# Patient Record
Sex: Male | Born: 1950
Health system: Southern US, Community
[De-identification: ages and names within clinical notes are randomized; demographics above are authoritative.]

## PROBLEM LIST (undated history)

## (undated) DIAGNOSIS — Z973 Presence of spectacles and contact lenses: Secondary | ICD-10-CM

## (undated) DIAGNOSIS — Z85820 Personal history of malignant melanoma of skin: Secondary | ICD-10-CM

## (undated) DIAGNOSIS — K644 Residual hemorrhoidal skin tags: Secondary | ICD-10-CM

## (undated) DIAGNOSIS — Z8546 Personal history of malignant neoplasm of prostate: Secondary | ICD-10-CM

## (undated) DIAGNOSIS — Z86010 Personal history of colonic polyps: Secondary | ICD-10-CM

## (undated) DIAGNOSIS — R351 Nocturia: Secondary | ICD-10-CM

## (undated) DIAGNOSIS — J449 Chronic obstructive pulmonary disease, unspecified: Secondary | ICD-10-CM

## (undated) DIAGNOSIS — Z860101 Personal history of adenomatous and serrated colon polyps: Secondary | ICD-10-CM

## (undated) DIAGNOSIS — G5601 Carpal tunnel syndrome, right upper limb: Secondary | ICD-10-CM

## (undated) DIAGNOSIS — M1990 Unspecified osteoarthritis, unspecified site: Secondary | ICD-10-CM

## (undated) DIAGNOSIS — C801 Malignant (primary) neoplasm, unspecified: Secondary | ICD-10-CM

## (undated) DIAGNOSIS — K219 Gastro-esophageal reflux disease without esophagitis: Secondary | ICD-10-CM

## (undated) HISTORY — PX: KNEE ARTHROSCOPY: SUR90

## (undated) HISTORY — PX: COLONOSCOPY: SHX174

## (undated) HISTORY — PX: EYE SURGERY: SHX253

## (undated) HISTORY — DX: Malignant (primary) neoplasm, unspecified: C80.1

## (undated) HISTORY — PX: PROSTATECTOMY: SHX69

---

## 1987-01-20 HISTORY — PX: ELBOW SURGERY: SHX618

## 1998-11-25 ENCOUNTER — Ambulatory Visit (HOSPITAL_COMMUNITY): Admission: RE | Admit: 1998-11-25 | Discharge: 1998-11-25 | Payer: Self-pay | Admitting: Gastroenterology

## 2000-09-02 ENCOUNTER — Ambulatory Visit (HOSPITAL_BASED_OUTPATIENT_CLINIC_OR_DEPARTMENT_OTHER): Admission: RE | Admit: 2000-09-02 | Discharge: 2000-09-02 | Payer: Self-pay | Admitting: *Deleted

## 2003-02-26 ENCOUNTER — Emergency Department (HOSPITAL_COMMUNITY): Admission: AD | Admit: 2003-02-26 | Discharge: 2003-02-26 | Payer: Self-pay | Admitting: Family Medicine

## 2003-08-26 ENCOUNTER — Emergency Department (HOSPITAL_COMMUNITY): Admission: EM | Admit: 2003-08-26 | Discharge: 2003-08-26 | Payer: Self-pay | Admitting: Family Medicine

## 2003-08-27 ENCOUNTER — Emergency Department (HOSPITAL_COMMUNITY): Admission: EM | Admit: 2003-08-27 | Discharge: 2003-08-27 | Payer: Self-pay | Admitting: Family Medicine

## 2004-01-28 ENCOUNTER — Ambulatory Visit (HOSPITAL_COMMUNITY): Admission: RE | Admit: 2004-01-28 | Discharge: 2004-01-28 | Payer: Self-pay | Admitting: Gastroenterology

## 2004-05-05 ENCOUNTER — Emergency Department (HOSPITAL_COMMUNITY): Admission: EM | Admit: 2004-05-05 | Discharge: 2004-05-05 | Payer: Self-pay | Admitting: Family Medicine

## 2004-07-27 ENCOUNTER — Emergency Department (HOSPITAL_COMMUNITY): Admission: EM | Admit: 2004-07-27 | Discharge: 2004-07-27 | Payer: Self-pay | Admitting: Family Medicine

## 2006-02-22 ENCOUNTER — Emergency Department (HOSPITAL_COMMUNITY): Admission: EM | Admit: 2006-02-22 | Discharge: 2006-02-22 | Payer: Self-pay | Admitting: Family Medicine

## 2011-11-08 ENCOUNTER — Emergency Department (HOSPITAL_COMMUNITY)
Admission: EM | Admit: 2011-11-08 | Discharge: 2011-11-08 | Disposition: A | Discharge status: Home / Self Care | Payer: BC Managed Care – PPO | Source: Home / Self Care | Attending: Family Medicine | Admitting: Family Medicine

## 2011-11-08 ENCOUNTER — Encounter (HOSPITAL_COMMUNITY): Payer: Self-pay | Admitting: Emergency Medicine

## 2011-11-08 DIAGNOSIS — L259 Unspecified contact dermatitis, unspecified cause: Secondary | ICD-10-CM

## 2011-11-08 MED ORDER — METHYLPREDNISOLONE ACETATE 80 MG/ML IJ SUSP
INTRAMUSCULAR | Status: AC
  Filled 2011-11-08: qty 1

## 2011-11-08 MED ORDER — PERMETHRIN 5 % EX CREA: TOPICAL_CREAM | CUTANEOUS | Status: DC

## 2011-11-08 MED ORDER — HYDROXYZINE HCL 25 MG PO TABS: 25.0000 mg | ORAL_TABLET | Freq: Four times a day (QID) | ORAL | Status: DC

## 2011-11-08 MED ORDER — FLUTICASONE PROPIONATE 0.05 % EX CREA: TOPICAL_CREAM | Freq: Two times a day (BID) | CUTANEOUS | Status: DC

## 2011-11-08 MED ORDER — METHYLPREDNISOLONE ACETATE 40 MG/ML IJ SUSP
80.0000 mg | Freq: Once | INTRAMUSCULAR | Status: AC
  Administered 2011-11-08: 80 mg via INTRAMUSCULAR

## 2011-11-08 NOTE — ED Provider Notes (Signed)
History     CSN: [id number]  Arrival date & time 11/08/11  0915   First MD Initiated Contact with Patient 11/08/11 [phone]      Chief Complaint  Patient presents with  . Rash    (Consider location/radiation/quality/duration/timing/severity/associated sxs/prior treatment) Patient is a 61 y.o. male presenting with rash. The history is provided by the patient.  Rash  This is a new problem. The current episode started more than 1 week ago (2 week h/o rash, travels a lot , staying in hotels, maintains good  hygiene.). The problem has not changed since onset.The problem is associated with an unknown factor. There has been no fever. The rash is present on the back, left ankle, right arm and left arm. The patient is experiencing no pain. Associated symptoms include itching.    Past Medical History  Diagnosis Date  . COPD (chronic obstructive pulmonary disease)   . Prostate cancer     Past Surgical History  Procedure Date  . Biopsy prostate     No family history on file.  History  Substance Use Topics  . Smoking status: Never Smoker   . Smokeless tobacco: Not on file  . Alcohol Use: Yes      Review of Systems  Constitutional: Negative.   Gastrointestinal: Negative.   Skin: Positive for itching and rash.    Allergies  Review of patient's allergies indicates no known allergies.  Home Medications   Current Outpatient Rx  Name Route Sig Dispense Refill  . BC HEADACHE POWDER PO Oral Take by mouth.    Marland Kitchen OVER THE COUNTER MEDICATION  Moisturizing lotions and anti-itch spray    . FLUTICASONE PROPIONATE 0.05 % EX CREA Topical Apply topically 2 (two) times daily. 60 g 0  . HYDROXYZINE HCL 25 MG PO TABS Oral Take 1 tablet (25 mg total) by mouth every 6 (six) hours. As needed for itching. 20 tablet 0  . PERMETHRIN 5 % EX CREA  Apply as directed on label, repeat in 1 week. 60 g 1    BP 150/89  Pulse 85  Temp 98 F (36.7 C) (Oral)  Resp 18  SpO2 100%  Physical Exam    Nursing note and vitals reviewed. Constitutional: He is oriented to person, place, and time. He appears well-developed and well-nourished.  Musculoskeletal: He exhibits no tenderness.  Neurological: He is alert and oriented to person, place, and time.  Skin: Skin is warm and dry. Rash noted.       Scattered excoriated papular rash on noted affected areas esp across back.  Psychiatric: He has a normal mood and affect.    ED Course  Procedures (including critical care time)  Labs Reviewed - No data to display No results found.   1. Contact dermatitis       MDM          Linna Hoff, MD 11/08/11 [phone]

## 2011-11-08 NOTE — ED Notes (Signed)
Patient reports rash for 2 weeks.  Patient travels frequently and just flew in from [place].  Reports rash itches.  Scattered , red bumps on back , ankles, antecubital of right arm.  Has used lotions for moisturizing and has a spray for itching.  Denies any one else with this

## 2011-12-13 ENCOUNTER — Encounter (HOSPITAL_COMMUNITY): Payer: Self-pay

## 2011-12-13 ENCOUNTER — Emergency Department (INDEPENDENT_AMBULATORY_CARE_PROVIDER_SITE_OTHER)
Admission: EM | Admit: 2011-12-13 | Discharge: 2011-12-13 | Disposition: A | Discharge status: Home / Self Care | Payer: BC Managed Care – PPO | Source: Home / Self Care | Attending: Family Medicine | Admitting: Family Medicine

## 2011-12-13 DIAGNOSIS — L259 Unspecified contact dermatitis, unspecified cause: Secondary | ICD-10-CM

## 2011-12-13 MED ORDER — METHYLPREDNISOLONE ACETATE 80 MG/ML IJ SUSP
INTRAMUSCULAR | Status: AC
  Filled 2011-12-13: qty 1

## 2011-12-13 MED ORDER — METHYLPREDNISOLONE ACETATE 40 MG/ML IJ SUSP
80.0000 mg | Freq: Once | INTRAMUSCULAR | Status: AC
  Administered 2011-12-13: 80 mg via INTRAMUSCULAR

## 2011-12-13 NOTE — ED Notes (Signed)
Seen 11-20 for rash, mostly resolved .C/o continued issues w rash on ankle

## 2011-12-13 NOTE — ED Provider Notes (Signed)
History     CSN: [id number]  Arrival date & time 12/13/11  4098   First MD Initiated Contact with Patient 12/13/11 [phone]      Chief Complaint  Patient presents with  . Rash    (Consider location/radiation/quality/duration/timing/severity/associated sxs/prior treatment) Patient is a 61 y.o. male presenting with rash. The history is provided by the patient.  Rash  This is a new problem. The current episode started more than 1 week ago. The problem has been rapidly improving. The problem is associated with an unknown factor. There has been no fever. The rash is present on the right ankle and back. The patient is experiencing no pain. Associated symptoms include itching.    Past Medical History  Diagnosis Date  . COPD (chronic obstructive pulmonary disease)   . Prostate cancer     Past Surgical History  Procedure Date  . Biopsy prostate     History reviewed. No pertinent family history.  History  Substance Use Topics  . Smoking status: Never Smoker   . Smokeless tobacco: Not on file  . Alcohol Use: Yes      Review of Systems  Constitutional: Negative.   Skin: Positive for itching and rash.    Allergies  Review of patient's allergies indicates no known allergies.  Home Medications   Current Outpatient Rx  Name  Route  Sig  Dispense  Refill  . BC HEADACHE POWDER PO   Oral   Take by mouth.         Marland Kitchen FLUTICASONE PROPIONATE 0.05 % EX CREA   Topical   Apply topically 2 (two) times daily.   60 g   0   . HYDROXYZINE HCL 25 MG PO TABS   Oral   Take 1 tablet (25 mg total) by mouth every 6 (six) hours. As needed for itching.   20 tablet   0   . OVER THE COUNTER MEDICATION      Moisturizing lotions and anti-itch spray         . PERMETHRIN 5 % EX CREA      Apply as directed on label, repeat in 1 week.   60 g   1     BP 169/96  Pulse 62  Temp 98.2 F (36.8 C) (Oral)  Resp 14  SpO2 97%  Physical Exam  Nursing note and vitals  reviewed. Constitutional: He appears well-developed and well-nourished.  Skin: Skin is warm and dry. Rash noted.       ED Course  Procedures (including critical care time)  Labs Reviewed - No data to display No results found.   1. Contact dermatitis       MDM          Linna Hoff, MD 12/13/11 [phone]

## 2012-02-05 ENCOUNTER — Encounter (HOSPITAL_COMMUNITY): Payer: Self-pay | Admitting: Emergency Medicine

## 2012-02-05 ENCOUNTER — Emergency Department (INDEPENDENT_AMBULATORY_CARE_PROVIDER_SITE_OTHER)
Admission: EM | Admit: 2012-02-05 | Discharge: 2012-02-05 | Disposition: A | Discharge status: Home / Self Care | Payer: BC Managed Care – PPO | Source: Home / Self Care | Attending: Emergency Medicine | Admitting: Emergency Medicine

## 2012-02-05 DIAGNOSIS — L259 Unspecified contact dermatitis, unspecified cause: Secondary | ICD-10-CM

## 2012-02-05 DIAGNOSIS — L300 Nummular dermatitis: Secondary | ICD-10-CM

## 2012-02-05 MED ORDER — FLUTICASONE PROPIONATE 0.05 % EX CREA: TOPICAL_CREAM | Freq: Two times a day (BID) | CUTANEOUS | Status: DC

## 2012-02-05 MED ORDER — METHYLPREDNISOLONE ACETATE 80 MG/ML IJ SUSP
INTRAMUSCULAR | Status: AC
  Filled 2012-02-05: qty 1

## 2012-02-05 MED ORDER — METHYLPREDNISOLONE ACETATE 80 MG/ML IJ SUSP
80.0000 mg | Freq: Once | INTRAMUSCULAR | Status: AC
  Administered 2012-02-05: 80 mg via INTRAMUSCULAR

## 2012-02-05 MED ORDER — HYDROXYZINE HCL 25 MG PO TABS: 25.0000 mg | ORAL_TABLET | Freq: Four times a day (QID) | ORAL | Status: DC

## 2012-02-05 MED ORDER — PREDNISONE 20 MG PO TABS: 20.0000 mg | ORAL_TABLET | Freq: Two times a day (BID) | ORAL | Status: DC

## 2012-02-05 NOTE — ED Provider Notes (Addendum)
Chief Complaint  Patient presents with  . Rash    History of Present Illness:   Daniel Barber is a 62 year old male who has had a four-month history of a rash on his arms, back, and chest. He came here in November when this first began and was given a prescription for fluticasone cream and hydroxyzine. This did seem to help for a while but then he ran of the meds and the rash came right back again. He cannot think of anything particular that he's been exposed to including changes in soaps, detergents, washing powder, dryer sheet, or fabric softener. He denies any exposure to plants, animals, new medications, or different foods. He's not had any trouble breathing or swelling of his lips, tongue, or throat. He also tried 2 courses of Elimite, but this failed to do any good.  Review of Systems:  Other than noted above, the patient denies any of the following symptoms: Systemic:  No fever, chills, sweats, weight loss, or fatigue. ENT:  No nasal congestion, rhinorrhea, sore throat, swelling of lips, tongue or throat. Resp:  No cough, wheezing, or shortness of breath. Skin:  No rash, itching, nodules, or suspicious lesions.  PMFSH:  Past medical history, family history, social history, meds, and allergies were reviewed.  Physical Exam:   Vital signs:  BP 144/97  Pulse 76  Temp 98.4 F (36.9 C) (Oral)  Resp 18  SpO2 97% Gen:  Alert, oriented, in no distress. ENT:  Pharynx clear, no intraoral lesions, moist mucous membranes. Lungs:  Clear to auscultation. Skin:  He has an erythematous, eczematous rash on the arms and trunk including the back. Some of the lesions on the back are round and coin-like .  Course in Urgent Care Center:   He was given Depo-Medrol 80 mg IM.  Assessment:  The encounter diagnosis was Nummular eczema.  I don't think this is scabies since he tried 2 courses of you might and it didn't help any. It appears to be eczema versus contact dermatitis versus nummular eczema.  I recommended a followup with his dermatologist.  Plan:   1.  The following meds were prescribed:   New Prescriptions   FLUTICASONE (CUTIVATE) 0.05 % CREAM    Apply topically 2 (two) times daily.   HYDROXYZINE (ATARAX/VISTARIL) 25 MG TABLET    Take 1 tablet (25 mg total) by mouth every 6 (six) hours.   PREDNISONE (DELTASONE) 20 MG TABLET    Take 1 tablet (20 mg total) by mouth 2 (two) times daily.   2.  The patient was instructed in symptomatic care and handouts were given. 3.  The patient was told to return if becoming worse in any way, if no better in 3 or 4 days, and given some red flag symptoms that would indicate earlier return.     Reuben Likes, MD 02/05/12 1709  Reuben Likes, MD 02/05/12 0272  Reuben Likes, MD 02/05/12 [phone]

## 2012-02-05 NOTE — ED Notes (Signed)
Pt c/o persistent rash on arms and back Was seen here back in October and Rx'd fluticasone, hydroxyzine, and permethrin cream Sx include: itching Denies: SOB, lip swelling, fevers, vomiting, nauseas, diarrhea  He is alert w/no signs of acute distress.

## 2013-12-28 ENCOUNTER — Encounter (HOSPITAL_COMMUNITY): Payer: Self-pay | Admitting: Emergency Medicine

## 2013-12-28 ENCOUNTER — Telehealth (HOSPITAL_COMMUNITY): Payer: Self-pay | Admitting: *Deleted

## 2013-12-28 ENCOUNTER — Emergency Department (HOSPITAL_COMMUNITY)
Admission: EM | Admit: 2013-12-28 | Discharge: 2013-12-28 | Disposition: A | Discharge status: Home / Self Care | Payer: BC Managed Care – PPO | Source: Home / Self Care | Attending: Family Medicine | Admitting: Family Medicine

## 2013-12-28 DIAGNOSIS — M79645 Pain in left finger(s): Secondary | ICD-10-CM

## 2013-12-28 DIAGNOSIS — M1990 Unspecified osteoarthritis, unspecified site: Secondary | ICD-10-CM

## 2013-12-28 LAB — URIC ACID: URIC ACID, SERUM: 6.8 mg/dL (ref 4.0–7.8)

## 2013-12-28 MED ORDER — TRIAMCINOLONE ACETONIDE 40 MG/ML IJ SUSP
INTRAMUSCULAR | Status: AC
  Filled 2013-12-28: qty 1

## 2013-12-28 MED ORDER — COLCHICINE 0.6 MG PO TABS: ORAL_TABLET | ORAL | Status: DC

## 2013-12-28 MED ORDER — TRIAMCINOLONE ACETONIDE 40 MG/ML IJ SUSP
40.0000 mg | Freq: Once | INTRAMUSCULAR | Status: AC
  Administered 2013-12-28: 40 mg via INTRAMUSCULAR

## 2013-12-28 MED ORDER — INDOMETHACIN 50 MG PO CAPS: 50.0000 mg | ORAL_CAPSULE | Freq: Three times a day (TID) | ORAL | Status: DC | PRN

## 2013-12-28 NOTE — ED Provider Notes (Signed)
CSN: [id number]     Arrival date & time 12/28/13  0801 History   First MD Initiated Contact with Patient 12/28/13 [phone]     Chief Complaint  Patient presents with  . Hand Pain   (Consider location/radiation/quality/duration/timing/severity/associated sxs/prior Treatment) HPI Comments: 63 year old male complaining of a two-week history of pain to the left index finger MCP. Gradual onset and progression from that time. It has leveled all in regards to swelling and pain. There has been no increase or change in the past several days. He does feel popping and cracking with flexion and extension of the MCP. Denies any known injury. He does work with his hands  as far as Investment banker, operational and working on the keyboard.   Past Medical History  Diagnosis Date  . COPD (chronic obstructive pulmonary disease)   . Prostate cancer    Past Surgical History  Procedure Laterality Date  . Biopsy prostate     No family history on file. History  Substance Use Topics  . Smoking status: Never Smoker   . Smokeless tobacco: Not on file  . Alcohol Use: Yes    Review of Systems  Constitutional: Negative.   Respiratory: Negative.   Gastrointestinal: Negative.   Genitourinary: Negative.   Musculoskeletal:       As per HPI  Skin: Negative.   Neurological: Negative for weakness and numbness.    Allergies  Review of patient's allergies indicates no known allergies.  Home Medications   Prior to Admission medications   Medication Sig Start Date End Date Taking? Authorizing Provider  Naproxen Sodium (ALEVE PO) Take by mouth.   Yes Historical Provider, MD  Aspirin-Salicylamide-Caffeine (BC HEADACHE POWDER PO) Take by mouth.    Historical Provider, MD  colchicine 0.6 MG tablet One tab po now, repeat in 2 hours, then 1 q d. 12/28/13   Janne Napoleon, NP  indomethacin (INDOCIN) 50 MG capsule Take 1 capsule (50 mg total) by mouth 3 (three) times daily as needed. 12/28/13   Janne Napoleon, NP   BP 151/82 mmHg   Pulse 58  Temp(Src) 98.6 F (37 C) (Oral)  Resp 18  SpO2 96% Physical Exam  Constitutional: He is oriented to person, place, and time. He appears well-developed and well-nourished. No distress.  HENT:  Head: Normocephalic and atraumatic.  Eyes: EOM are normal.  Neck: Normal range of motion. Neck supple.  Musculoskeletal:  Left hand exam reveals minimal swelling and erythema over the index finger MCP. There is no tenderness to the finger. There is mild tenderness to the MCP. Flexion and extension is intact although flexion is limited to 60. No deformity. Tenderness along the extensor tendon of the index MCP. No deformities. No lymphangitis, no increased warmth.  Neurological: He is alert and oriented to person, place, and time. No cranial nerve deficit.  Skin: Skin is warm and dry.  No loss and skin integrity. No wounds.  Psychiatric: He has a normal mood and affect.  Nursing note and vitals reviewed.   ED Course  Procedures (including critical care time) Labs Review Labs Reviewed  URIC ACID    Imaging Review No results found.   MDM   1. Pain in finger of left hand   2. Arthritis    Does not appear infectious, more at inflammatory Tx with colchicine trial and indomethacin with  INj of Kenalog 40 mg IM now Return for increase redness, pain swelling, heat, fever, loss of function.      Janne Napoleon, NP 12/28/13 [phone]  Janne Napoleon, NP 12/28/13 [phone]

## 2013-12-28 NOTE — ED Notes (Addendum)
Uric Acid 6.8.  Message sent to Janne Napoleon NP asking if pt. needs to be notified of result. He wrote back to notify pt. and tell him he can only take Colchicine for 3 days.  I called and left a message to call. Call 1. Roselyn Meier 12/28/2013 I called pt. Pt. verified x 2 and given result and instructions.  Told pt. to f/u with PCP. Roselyn Meier 12/31/2013

## 2013-12-28 NOTE — Discharge Instructions (Signed)
Arthritis, Nonspecific °Arthritis is inflammation of a joint. This usually means pain, redness, warmth or swelling are present. One or more joints may be involved. There are a number of types of arthritis. Your caregiver may not be able to tell what type of arthritis you have right away. °CAUSES  °The most common cause of arthritis is the wear and tear on the joint (osteoarthritis). This causes damage to the cartilage, which can break down over time. The knees, hips, back and neck are most often affected by this type of arthritis. °Other types of arthritis and common causes of joint pain include: °· Sprains and other injuries near the joint. Sometimes minor sprains and injuries cause pain and swelling that develop hours later. °· Rheumatoid arthritis. This affects hands, feet and knees. It usually affects both sides of your body at the same time. It is often associated with chronic ailments, fever, weight loss and general weakness. °· Crystal arthritis. Gout and pseudo gout can cause occasional acute severe pain, redness and swelling in the foot, ankle, or knee. °· Infectious arthritis. Bacteria can get into a joint through a break in overlying skin. This can cause infection of the joint. Bacteria and viruses can also spread through the blood and affect your joints. °· Drug, infectious and allergy reactions. Sometimes joints can become mildly painful and slightly swollen with these types of illnesses. °SYMPTOMS  °· Pain is the main symptom. °· Your joint or joints can also be red, swollen and warm or hot to the touch. °· You may have a fever with certain types of arthritis, or even feel overall ill. °· The joint with arthritis will hurt with movement. Stiffness is present with some types of arthritis. °DIAGNOSIS  °Your caregiver will suspect arthritis based on your description of your symptoms and on your exam. Testing may be needed to find the type of arthritis: °· Blood and sometimes urine tests. °· X-ray tests  and sometimes CT or MRI scans. °· Removal of fluid from the joint (arthrocentesis) is done to check for bacteria, crystals or other causes. Your caregiver (or a specialist) will numb the area over the joint with a local anesthetic, and use a needle to remove joint fluid for examination. This procedure is only minimally uncomfortable. °· Even with these tests, your caregiver may not be able to tell what kind of arthritis you have. Consultation with a specialist (rheumatologist) may be helpful. °TREATMENT  °Your caregiver will discuss with you treatment specific to your type of arthritis. If the specific type cannot be determined, then the following general recommendations may apply. °Treatment of severe joint pain includes: °· Rest. °· Elevation. °· Anti-inflammatory medication (for example, ibuprofen) may be prescribed. Avoiding activities that cause increased pain. °· Only take over-the-counter or prescription medicines for pain and discomfort as recommended by your caregiver. °· Cold packs over an inflamed joint may be used for 10 to 15 minutes every hour. Hot packs sometimes feel better, but do not use overnight. Do not use hot packs if you are diabetic without your caregiver's permission. °· A cortisone shot into arthritic joints may help reduce pain and swelling. °· Any acute arthritis that gets worse over the next 1 to 2 days needs to be looked at to be sure there is no joint infection. °Long-term arthritis treatment involves modifying activities and lifestyle to reduce joint stress jarring. This can include weight loss. Also, exercise is needed to nourish the joint cartilage and remove waste. This helps keep the muscles   around the joint strong. °HOME CARE INSTRUCTIONS  °· Do not take aspirin to relieve pain if gout is suspected. This elevates uric acid levels. °· Only take over-the-counter or prescription medicines for pain, discomfort or fever as directed by your caregiver. °· Rest the joint as much as  possible. °· If your joint is swollen, keep it elevated. °· Use crutches if the painful joint is in your leg. °· Drinking plenty of fluids may help for certain types of arthritis. °· Follow your caregiver's dietary instructions. °· Try low-impact exercise such as: °¨ Swimming. °¨ Water aerobics. °¨ Biking. °¨ Walking. °· Morning stiffness is often relieved by a warm shower. °· Put your joints through regular range-of-motion. °SEEK MEDICAL CARE IF:  °· You do not feel better in 24 hours or are getting worse. °· You have side effects to medications, or are not getting better with treatment. °SEEK IMMEDIATE MEDICAL CARE IF:  °· You have a fever. °· You develop severe joint pain, swelling or redness. °· Many joints are involved and become painful and swollen. °· There is severe back pain and/or leg weakness. °· You have loss of bowel or bladder control. °Document Released: 02/13/2004 Document Revised: 03/30/2011 Document Reviewed: 02/29/2008 °[employer]® Patient Information ©2015 [employer]. This information is not intended to replace advice given to you by your health care provider. Make sure you discuss any questions you have with your health care provider. ° °

## 2013-12-28 NOTE — ED Notes (Signed)
Left index finger and hand pain, no known injury.  Patient reports onset 2 weeks ago, gradually worsening.  Finger is painful and swollen

## 2014-05-30 ENCOUNTER — Encounter: Payer: Self-pay | Admitting: Family

## 2014-05-30 ENCOUNTER — Other Ambulatory Visit (INDEPENDENT_AMBULATORY_CARE_PROVIDER_SITE_OTHER): Payer: BLUE CROSS/BLUE SHIELD

## 2014-05-30 ENCOUNTER — Ambulatory Visit (INDEPENDENT_AMBULATORY_CARE_PROVIDER_SITE_OTHER): Payer: BLUE CROSS/BLUE SHIELD | Admitting: Family

## 2014-05-30 VITALS — BP 152/92 | HR 54 | Temp 97.6°F | Resp 18 | Ht 71.0 in | Wt 248.1 lb

## 2014-05-30 DIAGNOSIS — Z0000 Encounter for general adult medical examination without abnormal findings: Secondary | ICD-10-CM | POA: Insufficient documentation

## 2014-05-30 DIAGNOSIS — Z23 Encounter for immunization: Secondary | ICD-10-CM

## 2014-05-30 LAB — LIPID PANEL
CHOLESTEROL: 198 mg/dL (ref 0–200)
HDL: 53.6 mg/dL (ref >39.00)
LDL Cholesterol: 128 mg/dL — ABNORMAL HIGH (ref 0–99)
NonHDL: 144.4
Total CHOL/HDL Ratio: 4
Triglycerides: 82 mg/dL (ref 0.0–149.0)
VLDL: 16.4 mg/dL (ref 0.0–40.0)

## 2014-05-30 LAB — BASIC METABOLIC PANEL
BUN: 19 mg/dL (ref 6–23)
CO2: 27 mEq/L (ref 19–32)
Calcium: 9.1 mg/dL (ref 8.4–10.5)
Chloride: 103 mEq/L (ref 96–112)
Creatinine, Ser: 1.13 mg/dL (ref 0.40–1.50)
GFR: 69.25 mL/min (ref >60.00)
GLUCOSE: 100 mg/dL — AB (ref 70–99)
POTASSIUM: 4.9 meq/L (ref 3.5–5.1)
SODIUM: 135 meq/L (ref 135–145)

## 2014-05-30 LAB — CBC
HCT: 49 % (ref 39.0–52.0)
HEMOGLOBIN: 17 g/dL (ref 13.0–17.0)
MCHC: 34.7 g/dL (ref 30.0–36.0)
MCV: 91.3 fl (ref 78.0–100.0)
PLATELETS: 195 10*3/uL (ref 150.0–400.0)
RBC: 5.37 Mil/uL (ref 4.22–5.81)
RDW: 13.6 % (ref 11.5–15.5)
WBC: 8.2 10*3/uL (ref 4.0–10.5)

## 2014-05-30 LAB — TSH: TSH: 1.5 u[IU]/mL (ref 0.35–4.50)

## 2014-05-30 NOTE — Assessment & Plan Note (Signed)
1) Anticipatory Guidance: Discussed importance of wearing a seatbelt while driving and not texting while driving; changing batteries in smoke detector at least once annually; wearing suntan lotion when outside; eating a balanced and moderate diet; getting physical activity at least 30 minutes per day.  2) Immunizations / Screenings / Labs:  Tetanus and shingles vaccinations updated today. All other immunizations are up-to-date per recommendations. Patient will be due for colonoscopy which she will schedule independently. Other screenings are up-to-date per recommendations. Obtain CBC, BMET, Lipid profile and TSH.   Overall well exam. Patient has cardiovascular risk factor of obesity. Discussed increasing nutrient density and decreasing saturated fats while increasing physical activity 30 minutes most days a week. Goal would be to lose approximate 5-10% of his current body weight with lifestyle changes. Blood pressure is elevated today, however patient indicates this is the first reading that this has occurred. Will continue to monitor.  Follow-up prevention exam in 1 year. Follow-up office visit pending lab work.

## 2014-05-30 NOTE — Progress Notes (Signed)
Subjective:    Patient ID: Daniel Barber, male    DOB: 27-Oct-1950, 64 y.o.   MRN: [id number]  Chief Complaint  Patient presents with  . Establish Care    CPE: fasting, tetanus and shingles    HPI:  Daniel Barber is a 64 y.o. male who presents today for an annual wellness visit.   1) Health Maintenance -   Diet - Averages 2 meals per day consisting of fruits and vegetables with some processed and fast food; Caffeine intake - 3-4 cups per day  Exercise - Walks and works in the garden. No structured exercise.     2) Preventative Exams / Immunizations:  Dental -- Up to date  Vision -- Up to date   Health Maintenance  Topic Date Due  . HIV Screening  07/20/1964  . TETANUS/TDAP  07/20/1968  . COLONOSCOPY  07/21/1999  . ZOSTAVAX  07/20/2009  . INFLUENZA VACCINE  08/20/2014  Tetatnus and shingles today; will schedule colonoscopy   There is no immunization history on file for this patient.   Review of Systems  Constitutional: Denies fever, chills, fatigue, or significant weight gain/loss. HENT: Head: Denies headache or neck pain Ears: Denies changes in hearing, ringing in ears, earache, drainage Nose: Denies discharge, stuffiness, itching, nosebleed, sinus pain Throat: Denies sore throat, hoarseness, dry mouth, sores, thrush Eyes: Denies loss/changes in vision, pain, redness, blurry/double vision, flashing lights Cardiovascular: Denies chest pain/discomfort, tightness, palpitations, shortness of breath with activity, difficulty lying down, swelling, sudden awakening with shortness of breath Respiratory: Denies shortness of breath, cough, sputum production, wheezing Gastrointestinal: Denies dysphasia, heartburn, change in appetite, nausea, change in bowel habits, rectal bleeding, constipation, diarrhea, yellow skin or eyes Genitourinary: Denies frequency, urgency, burning/pain, blood in urine, incontinence, change in urinary strength. Musculoskeletal: Denies  muscle/joint pain, stiffness, back pain, redness or swelling of joints, trauma Skin: Denies rashes, lumps, itching, dryness, color changes, or hair/nail changes Neurological: Denies dizziness, fainting, seizures, weakness, numbness, tingling, tremor Psychiatric - Denies nervousness, stress, depression or memory loss Endocrine: Denies heat or cold intolerance, sweating, frequent urination, excessive thirst, changes in appetite Hematologic: Denies ease of bruising or bleeding     Objective:     BP 152/92 mmHg  Pulse 54  Temp(Src) 97.6 F (36.4 C) (Oral)  Resp 18  Ht 5\' 11"  (1.803 m)  Wt 248 lb 1.9 oz (112.546 kg)  BMI 34.62 kg/m2  SpO2 96% Nursing note and vital signs reviewed.  Physical Exam  Constitutional: He is oriented to person, place, and time. He appears well-developed and well-nourished.  HENT:  Head: Normocephalic.  Right Ear: Hearing, tympanic membrane, external ear and ear canal normal.  Left Ear: Hearing, tympanic membrane, external ear and ear canal normal.  Nose: Nose normal.  Mouth/Throat: Uvula is midline, oropharynx is clear and moist and mucous membranes are normal.  Eyes: Conjunctivae and EOM are normal. Pupils are equal, round, and reactive to light.  Neck: Neck supple. No JVD present. No tracheal deviation present. No thyromegaly present.  Cardiovascular: Normal rate, regular rhythm, normal heart sounds and intact distal pulses.   Pulmonary/Chest: Effort normal and breath sounds normal.  Abdominal: Soft. Bowel sounds are normal. He exhibits no distension and no mass. There is no tenderness. There is no rebound and no guarding.  Musculoskeletal: Normal range of motion. He exhibits no edema or tenderness.  Lymphadenopathy:    He has no cervical adenopathy.  Neurological: He is alert and oriented to person, place, and time. He has  normal reflexes. No cranial nerve deficit. He exhibits normal muscle tone. Coordination normal.  Skin: Skin is warm and dry.    Psychiatric: He has a normal mood and affect. His behavior is normal. Judgment and thought content normal.       Assessment & Plan:

## 2014-05-30 NOTE — Patient Instructions (Signed)
Thank you for choosing [facility].  Summary/Instructions:  Please stop by the lab on the basement level of the building for your blood work. Your results will be released to [place] (or called to you) after review, usually within 72 hours after test completion. If any changes need to be made, you will be notified at that same time.  If your symptoms worsen or fail to improve, please contact our office for further instruction, or in case of emergency go directly to the emergency room at the closest medical facility.   Health Maintenance A healthy lifestyle and preventative care can promote health and wellness.  Maintain regular health, dental, and eye exams.  Eat a healthy diet. Foods like vegetables, fruits, whole grains, low-fat dairy products, and lean protein foods contain the nutrients you need and are low in calories. Decrease your intake of foods high in solid fats, added sugars, and salt. Get information about a proper diet from your health care provider, if necessary.  Regular physical exercise is one of the most important things you can do for your health. Most adults should get at least 150 minutes of moderate-intensity exercise (any activity that increases your heart rate and causes you to sweat) each week. In addition, most adults need muscle-strengthening exercises on 2 or more days a week.   Maintain a healthy weight. The body mass index (BMI) is a screening tool to identify possible weight problems. It provides an estimate of body fat based on height and weight. Your health care provider can find your BMI and can help you achieve or maintain a healthy weight. For males 20 years and older:  A BMI below 18.5 is considered underweight.  A BMI of 18.5 to 24.9 is normal.  A BMI of 25 to 29.9 is considered overweight.  A BMI of 30 and above is considered obese.  Maintain normal blood lipids and cholesterol by exercising and minimizing your intake of saturated fat. Eat a  balanced diet with plenty of fruits and vegetables. Blood tests for lipids and cholesterol should begin at age 69 and be repeated every 5 years. If your lipid or cholesterol levels are high, you are over age 84, or you are at high risk for heart disease, you may need your cholesterol levels checked more frequently.Ongoing high lipid and cholesterol levels should be treated with medicines if diet and exercise are not working.  If you smoke, find out from your health care provider how to quit. If you do not use tobacco, do not start.  Lung cancer screening is recommended for adults aged 33-80 years who are at high risk for developing lung cancer because of a history of smoking. A yearly low-dose CT scan of the lungs is recommended for people who have at least a 30-pack-year history of smoking and are current smokers or have quit within the past 15 years. A pack year of smoking is smoking an average of 1 pack of cigarettes a day for 1 year (for example, a 30-pack-year history of smoking could mean smoking 1 pack a day for 30 years or 2 packs a day for 15 years). Yearly screening should continue until the smoker has stopped smoking for at least 15 years. Yearly screening should be stopped for people who develop a health problem that would prevent them from having lung cancer treatment.  If you choose to drink alcohol, do not have more than 2 drinks per day. One drink is considered to be 12 oz (360 mL) of beer,  5 oz (150 mL) of wine, or 1.5 oz (45 mL) of liquor.  Avoid the use of street drugs. Do not share needles with anyone. Ask for help if you need support or instructions about stopping the use of drugs.  High blood pressure causes heart disease and increases the risk of stroke. Blood pressure should be checked at least every 1-2 years. Ongoing high blood pressure should be treated with medicines if weight loss and exercise are not effective.  If you are 34-52 years old, ask your health care provider if  you should take aspirin to prevent heart disease.  Diabetes screening involves taking a blood sample to check your fasting blood sugar level. This should be done once every 3 years after age 12 if you are at a normal weight and without risk factors for diabetes. Testing should be considered at a younger age or be carried out more frequently if you are overweight and have at least 1 risk factor for diabetes.  Colorectal cancer can be detected and often prevented. Most routine colorectal cancer screening begins at the age of 61 and continues through age 37. However, your health care provider may recommend screening at an earlier age if you have risk factors for colon cancer. On a yearly basis, your health care provider may provide home test kits to check for hidden blood in the stool. A small camera at the end of a tube may be used to directly examine the colon (sigmoidoscopy or colonoscopy) to detect the earliest forms of colorectal cancer. Talk to your health care provider about this at age 57 when routine screening begins. A direct exam of the colon should be repeated every 5-10 years through age 78, unless early forms of precancerous polyps or small growths are found.  People who are at an increased risk for hepatitis B should be screened for this virus. You are considered at high risk for hepatitis B if:  You were born in a country where hepatitis B occurs often. Talk with your health care provider about which countries are considered high risk.  Your parents were born in a high-risk country and you have not received a shot to protect against hepatitis B (hepatitis B vaccine).  You have HIV or AIDS.  You use needles to inject street drugs.  You live with, or have sex with, someone who has hepatitis B.  You are a man who has sex with other men (MSM).  You get hemodialysis treatment.  You take certain medicines for conditions like cancer, organ transplantation, and autoimmune  conditions.  Hepatitis C blood testing is recommended for all people born from [birth year] through 1965 and any individual with known risk factors for hepatitis C.  Healthy men should no longer receive prostate-specific antigen (PSA) blood tests as part of routine cancer screening. Talk to your health care provider about prostate cancer screening.  Testicular cancer screening is not recommended for adolescents or adult males who have no symptoms. Screening includes self-exam, a health care provider exam, and other screening tests. Consult with your health care provider about any symptoms you have or any concerns you have about testicular cancer.  Practice safe sex. Use condoms and avoid high-risk sexual practices to reduce the spread of sexually transmitted infections (STIs).  You should be screened for STIs, including gonorrhea and chlamydia if:  You are sexually active and are younger than 24 years.  You are older than 24 years, and your health care provider tells you that you are at  risk for this type of infection.  Your sexual activity has changed since you were last screened, and you are at an increased risk for chlamydia or gonorrhea. Ask your health care provider if you are at risk.  If you are at risk of being infected with HIV, it is recommended that you take a prescription medicine daily to prevent HIV infection. This is called pre-exposure prophylaxis (PrEP). You are considered at risk if:  You are a man who has sex with other men (MSM).  You are a heterosexual man who is sexually active with multiple partners.  You take drugs by injection.  You are sexually active with a partner who has HIV.  Talk with your health care provider about whether you are at high risk of being infected with HIV. If you choose to begin PrEP, you should first be tested for HIV. You should then be tested every 3 months for as long as you are taking PrEP.  Use sunscreen. Apply sunscreen liberally and  repeatedly throughout the day. You should seek shade when your shadow is shorter than you. Protect yourself by wearing long sleeves, pants, a wide-brimmed hat, and sunglasses year round whenever you are outdoors.  Tell your health care provider of new moles or changes in moles, especially if there is a change in shape or color. Also, tell your health care provider if a mole is larger than the size of a pencil eraser.  A one-time screening for abdominal aortic aneurysm (AAA) and surgical repair of large AAAs by ultrasound is recommended for men aged 69-75 years who are current or former smokers.  Stay current with your vaccines (immunizations). Document Released: 07/04/2007 Document Revised: 01/10/2013 Document Reviewed: 06/02/2010 [facility] Patient Information 2015 [place], [place]. This information is not intended to replace advice given to you by your health care provider. Make sure you discuss any questions you have with your health care provider.

## 2014-05-30 NOTE — Progress Notes (Signed)
Pre visit review using our clinic review tool, if applicable. No additional management support is needed unless otherwise documented below in the visit note. 

## 2014-05-31 ENCOUNTER — Encounter: Payer: Self-pay | Admitting: Family

## 2014-11-02 ENCOUNTER — Encounter: Payer: Self-pay | Admitting: Family

## 2014-12-05 ENCOUNTER — Ambulatory Visit (INDEPENDENT_AMBULATORY_CARE_PROVIDER_SITE_OTHER): Payer: Medicare HMO

## 2014-12-05 ENCOUNTER — Other Ambulatory Visit: Payer: Medicare HMO

## 2014-12-05 DIAGNOSIS — Z1159 Encounter for screening for other viral diseases: Secondary | ICD-10-CM

## 2014-12-05 DIAGNOSIS — Z23 Encounter for immunization: Secondary | ICD-10-CM

## 2014-12-05 LAB — HEPATITIS C ANTIBODY: HCV Ab: NEGATIVE

## 2014-12-06 ENCOUNTER — Encounter: Payer: Self-pay | Admitting: Family

## 2014-12-19 ENCOUNTER — Ambulatory Visit (INDEPENDENT_AMBULATORY_CARE_PROVIDER_SITE_OTHER): Payer: Medicare HMO | Admitting: General Practice

## 2014-12-19 DIAGNOSIS — Z23 Encounter for immunization: Secondary | ICD-10-CM

## 2015-01-20 DIAGNOSIS — Z85820 Personal history of malignant melanoma of skin: Secondary | ICD-10-CM

## 2015-01-20 HISTORY — DX: Personal history of malignant melanoma of skin: Z85.820

## 2015-01-20 HISTORY — PX: MELANOMA EXCISION: SHX5266

## 2015-03-02 ENCOUNTER — Emergency Department (INDEPENDENT_AMBULATORY_CARE_PROVIDER_SITE_OTHER): Payer: Medicare HMO

## 2015-03-02 ENCOUNTER — Encounter (HOSPITAL_COMMUNITY): Payer: Self-pay | Admitting: Emergency Medicine

## 2015-03-02 ENCOUNTER — Emergency Department (HOSPITAL_COMMUNITY)
Admission: EM | Admit: 2015-03-02 | Discharge: 2015-03-02 | Disposition: A | Discharge status: Home / Self Care | Payer: Medicare HMO | Source: Home / Self Care | Attending: Family Medicine | Admitting: Family Medicine

## 2015-03-02 DIAGNOSIS — J0101 Acute recurrent maxillary sinusitis: Secondary | ICD-10-CM

## 2015-03-02 MED ORDER — IPRATROPIUM BROMIDE 0.06 % NA SOLN: 2.0000 | Freq: Four times a day (QID) | NASAL | Status: DC

## 2015-03-02 MED ORDER — GUAIFENESIN-CODEINE 100-10 MG/5ML PO SYRP: 10.0000 mL | ORAL_SOLUTION | Freq: Four times a day (QID) | ORAL | Status: DC | PRN

## 2015-03-02 MED ORDER — AZITHROMYCIN 250 MG PO TABS: ORAL_TABLET | ORAL | Status: DC

## 2015-03-02 NOTE — ED Notes (Signed)
Pt here with cough, nasal congestion and clear to yellow drainage  Started 02/20/15 Slight chest soreness from coughing Denies fever,chills,n,v Taking Nyquil and Dayquil

## 2015-03-02 NOTE — ED Provider Notes (Signed)
CSN: [id number]     Arrival date & time 03/02/15  1300 History   First MD Initiated Contact with Patient 03/02/15 1322     Chief Complaint  Patient presents with  . URI   (Consider location/radiation/quality/duration/timing/severity/associated sxs/prior Treatment) Patient is a 65 y.o. male presenting with URI. The history is provided by the patient and the spouse.  URI Presenting symptoms: congestion, cough and rhinorrhea   Presenting symptoms: no facial pain and no fever   Severity:  Moderate Onset quality:  Gradual Duration:  10 days Progression:  Unchanged Chronicity:  New Ineffective treatments:  OTC medications Risk factors: recent travel   Risk factors: no sick contacts   Risk factors comment:  [place] sick.   Past Medical History  Diagnosis Date  . Prostate cancer ([place])   . Melanoma ([place])   . COPD (chronic obstructive pulmonary disease) [facility])    Past Surgical History  Procedure Laterality Date  . Biopsy prostate    . Arthroscopy right knee    . Arthroscopy right elbow     Family History  Problem Relation Age of Onset  . Colon cancer Mother   . Heart attack Father   . Healthy Maternal Grandmother   . Healthy Maternal Grandfather   . Healthy Paternal Grandmother   . Healthy Paternal Grandfather    Social History  Substance Use Topics  . Smoking status: Former Research scientist (life sciences)  . Smokeless tobacco: Never Used  . Alcohol Use: 4.2 oz/week    7 Glasses of wine per week    Review of Systems  Constitutional: Negative for fever.  HENT: Positive for congestion and rhinorrhea.   Respiratory: Positive for cough.     Allergies  Review of patient's allergies indicates no known allergies.  Home Medications   Prior to Admission medications   Medication Sig Start Date End Date Taking? Authorizing Provider  naproxen sodium (ANAPROX) 220 MG tablet Take 220 mg by mouth 2 (two) times daily with a meal.    Historical Provider, MD  omeprazole (PRILOSEC) 10 MG capsule  Take 10 mg by mouth daily.    Historical Provider, MD   Meds Ordered and Administered this Visit  Medications - No data to display  BP 135/87 mmHg  Pulse 58  Temp(Src) 98.2 F (36.8 C) (Oral)  SpO2 96% No data found.   Physical Exam  ED Course  Procedures (including critical care time)  Labs Review Labs Reviewed - No data to display  Imaging Review No results found. X-rays reviewed and report per radiologist.   Visual Acuity Review  Right Eye Distance:   Left Eye Distance:   Bilateral Distance:    Right Eye Near:   Left Eye Near:    Bilateral Near:         MDM  No diagnosis found.  Meds ordered this encounter  Medications  . azithromycin (ZITHROMAX Z-PAK) 250 MG tablet    Sig: Take as directed on pack    Dispense:  6 tablet    Refill:  0  . ipratropium (ATROVENT) 0.06 % nasal spray    Sig: Place 2 sprays into both nostrils 4 (four) times daily.    Dispense:  15 mL    Refill:  1  . guaiFENesin-codeine (ROBITUSSIN AC) 100-10 MG/5ML syrup    Sig: Take 10 mLs by mouth 4 (four) times daily as needed for cough.    Dispense:  180 mL    Refill:  0      Billy Fischer,  MD 03/02/15 1410

## 2015-03-02 NOTE — Discharge Instructions (Signed)
Drink plenty of fluids as discussed, use medicine as prescribed, and mucinex or delsym for cough. Return or see your doctor if further problems °

## 2015-06-11 ENCOUNTER — Other Ambulatory Visit (INDEPENDENT_AMBULATORY_CARE_PROVIDER_SITE_OTHER): Payer: Medicare HMO

## 2015-06-11 ENCOUNTER — Ambulatory Visit (INDEPENDENT_AMBULATORY_CARE_PROVIDER_SITE_OTHER): Payer: Medicare HMO | Admitting: Family

## 2015-06-11 ENCOUNTER — Encounter: Payer: Self-pay | Admitting: Family

## 2015-06-11 VITALS — BP 134/88 | HR 60 | Temp 97.5°F | Resp 16 | Ht 72.0 in | Wt 246.0 lb

## 2015-06-11 DIAGNOSIS — Z0000 Encounter for general adult medical examination without abnormal findings: Secondary | ICD-10-CM | POA: Insufficient documentation

## 2015-06-11 DIAGNOSIS — Z125 Encounter for screening for malignant neoplasm of prostate: Secondary | ICD-10-CM

## 2015-06-11 LAB — CBC
HEMATOCRIT: 48.5 % (ref 39.0–52.0)
HEMOGLOBIN: 16.5 g/dL (ref 13.0–17.0)
MCHC: 34.1 g/dL (ref 30.0–36.0)
MCV: 91.2 fl (ref 78.0–100.0)
PLATELETS: 211 10*3/uL (ref 150.0–400.0)
RBC: 5.31 Mil/uL (ref 4.22–5.81)
RDW: 13.4 % (ref 11.5–15.5)
WBC: 9.7 10*3/uL (ref 4.0–10.5)

## 2015-06-11 NOTE — Patient Instructions (Signed)
Thank you for choosing [facility].  Summary/Instructions:   Please stop by the lab on the basement level of the building for your blood work. Your results will be released to [place] (or called to you) after review, usually within 72 hours after test completion. If any changes need to be made, you will be notified at that same time.  Health Maintenance  Topic Date Due  . HIV Screening  07/20/1965  . INFLUENZA VACCINE  08/20/2015  . PNA vac Low Risk Adult (2 of 2 - PPSV23) 12/19/2015  . TETANUS/TDAP  05/29/2024  . COLONOSCOPY  08/05/2024  . ZOSTAVAX  Completed  . Hepatitis C Screening  Completed   Health Maintenance, Male A healthy lifestyle and preventative care can promote health and wellness.  Maintain regular health, dental, and eye exams.  Eat a healthy diet. Foods like vegetables, fruits, whole grains, low-fat dairy products, and lean protein foods contain the nutrients you need and are low in calories. Decrease your intake of foods high in solid fats, added sugars, and salt. Get information about a proper diet from your health care provider, if necessary.  Regular physical exercise is one of the most important things you can do for your health. Most adults should get at least 150 minutes of moderate-intensity exercise (any activity that increases your heart rate and causes you to sweat) each week. In addition, most adults need muscle-strengthening exercises on 2 or more days a week.   Maintain a healthy weight. The body mass index (BMI) is a screening tool to identify possible weight problems. It provides an estimate of body fat based on height and weight. Your health care provider can find your BMI and can help you achieve or maintain a healthy weight. For males 20 years and older:  A BMI below 18.5 is considered underweight.  A BMI of 18.5 to 24.9 is normal.  A BMI of 25 to 29.9 is considered overweight.  A BMI of 30 and above is considered obese.  Maintain normal  blood lipids and cholesterol by exercising and minimizing your intake of saturated fat. Eat a balanced diet with plenty of fruits and vegetables. Blood tests for lipids and cholesterol should begin at age 59 and be repeated every 5 years. If your lipid or cholesterol levels are high, you are over age 81, or you are at high risk for heart disease, you may need your cholesterol levels checked more frequently.Ongoing high lipid and cholesterol levels should be treated with medicines if diet and exercise are not working.  If you smoke, find out from your health care provider how to quit. If you do not use tobacco, do not start.  Lung cancer screening is recommended for adults aged 70-80 years who are at high risk for developing lung cancer because of a history of smoking. A yearly low-dose CT scan of the lungs is recommended for people who have at least a 30-pack-year history of smoking and are current smokers or have quit within the past 15 years. A pack year of smoking is smoking an average of 1 pack of cigarettes a day for 1 year (for example, a 30-pack-year history of smoking could mean smoking 1 pack a day for 30 years or 2 packs a day for 15 years). Yearly screening should continue until the smoker has stopped smoking for at least 15 years. Yearly screening should be stopped for people who develop a health problem that would prevent them from having lung cancer treatment.  If you choose  to drink alcohol, do not have more than 2 drinks per day. One drink is considered to be 12 oz (360 mL) of beer, 5 oz (150 mL) of wine, or 1.5 oz (45 mL) of liquor.  Avoid the use of street drugs. Do not share needles with anyone. Ask for help if you need support or instructions about stopping the use of drugs.  High blood pressure causes heart disease and increases the risk of stroke. High blood pressure is more likely to develop in:  People who have blood pressure in the end of the normal range (100-139/85-89 mm  Hg).  People who are overweight or obese.  People who are African American.  If you are 62-86 years of age, have your blood pressure checked every 3-5 years. If you are 60 years of age or older, have your blood pressure checked every year. You should have your blood pressure measured twice--once when you are at a hospital or clinic, and once when you are not at a hospital or clinic. Record the average of the two measurements. To check your blood pressure when you are not at a hospital or clinic, you can use:  An automated blood pressure machine at a pharmacy.  A home blood pressure monitor.  If you are 56-41 years old, ask your health care provider if you should take aspirin to prevent heart disease.  Diabetes screening involves taking a blood sample to check your fasting blood sugar level. This should be done once every 3 years after age 70 if you are at a normal weight and without risk factors for diabetes. Testing should be considered at a younger age or be carried out more frequently if you are overweight and have at least 1 risk factor for diabetes.  Colorectal cancer can be detected and often prevented. Most routine colorectal cancer screening begins at the age of 48 and continues through age 66. However, your health care provider may recommend screening at an earlier age if you have risk factors for colon cancer. On a yearly basis, your health care provider may provide home test kits to check for hidden blood in the stool. A small camera at the end of a tube may be used to directly examine the colon (sigmoidoscopy or colonoscopy) to detect the earliest forms of colorectal cancer. Talk to your health care provider about this at age 49 when routine screening begins. A direct exam of the colon should be repeated every 5-10 years through age 98, unless early forms of precancerous polyps or small growths are found.  People who are at an increased risk for hepatitis B should be screened for this  virus. You are considered at high risk for hepatitis B if:  You were born in a country where hepatitis B occurs often. Talk with your health care provider about which countries are considered high risk.  Your parents were born in a high-risk country and you have not received a shot to protect against hepatitis B (hepatitis B vaccine).  You have HIV or AIDS.  You use needles to inject street drugs.  You live with, or have sex with, someone who has hepatitis B.  You are a man who has sex with other men (MSM).  You get hemodialysis treatment.  You take certain medicines for conditions like cancer, organ transplantation, and autoimmune conditions.  Hepatitis C blood testing is recommended for all people born from [birth year] through 1966 and any individual with known risk factors for hepatitis C.  Healthy men  should no longer receive prostate-specific antigen (PSA) blood tests as part of routine cancer screening. Talk to your health care provider about prostate cancer screening.  Testicular cancer screening is not recommended for adolescents or adult males who have no symptoms. Screening includes self-exam, a health care provider exam, and other screening tests. Consult with your health care provider about any symptoms you have or any concerns you have about testicular cancer.  Practice safe sex. Use condoms and avoid high-risk sexual practices to reduce the spread of sexually transmitted infections (STIs).  You should be screened for STIs, including gonorrhea and chlamydia if:  You are sexually active and are younger than 24 years.  You are older than 24 years, and your health care provider tells you that you are at risk for this type of infection.  Your sexual activity has changed since you were last screened, and you are at an increased risk for chlamydia or gonorrhea. Ask your health care provider if you are at risk.  If you are at risk of being infected with HIV, it is recommended that  you take a prescription medicine daily to prevent HIV infection. This is called pre-exposure prophylaxis (PrEP). You are considered at risk if:  You are a man who has sex with other men (MSM).  You are a heterosexual man who is sexually active with multiple partners.  You take drugs by injection.  You are sexually active with a partner who has HIV.  Talk with your health care provider about whether you are at high risk of being infected with HIV. If you choose to begin PrEP, you should first be tested for HIV. You should then be tested every 3 months for as long as you are taking PrEP.  Use sunscreen. Apply sunscreen liberally and repeatedly throughout the day. You should seek shade when your shadow is shorter than you. Protect yourself by wearing long sleeves, pants, a wide-brimmed hat, and sunglasses year round whenever you are outdoors.  Tell your health care provider of new moles or changes in moles, especially if there is a change in shape or color. Also, tell your health care provider if a mole is larger than the size of a pencil eraser.  A one-time screening for abdominal aortic aneurysm (AAA) and surgical repair of large AAAs by ultrasound is recommended for men aged 11-75 years who are current or former smokers.  Stay current with your vaccines (immunizations).   This information is not intended to replace advice given to you by your health care provider. Make sure you discuss any questions you have with your health care provider.   Document Released: 07/04/2007 Document Revised: 01/26/2014 Document Reviewed: 06/02/2010 Elsevier Interactive Patient Education [employer].

## 2015-06-11 NOTE — Assessment & Plan Note (Signed)
1) Anticipatory Guidance: Discussed importance of wearing a seatbelt while driving and not texting while driving; changing batteries in smoke detector at least once annually; wearing suntan lotion when outside; eating a balanced and moderate diet; getting physical activity at least 30 minutes per day.  2) Immunizations / Screenings / Labs:  Due for a pneumovax which will be scheduled. All other immunizations are up to date per recommendations. Obtain PSA for prostate cancer screening. All other screenings are up to date per recommendations. Obtain CBC, CMET, and Lipid profile.  Overall well exam with risk factors for cardiovascular disease including obesity. Recommend continued physical activity and nutritional intake that is moderate, balance, and varied and low in saturated fats and processed/sugary foods. Goal weight loss approximate 5-10% of current body weight. Continue other healthy lifestyle behaviors and choices. Follow-up prevention exam in 1 year. Follow-up office visit pending blood work if necessary.

## 2015-06-11 NOTE — Assessment & Plan Note (Signed)
Reviewed and updated patient's medical, surgical, family and social history. Medications and allergies were also reviewed. Basic screenings for depression, activities of daily living, hearing, cognition and safety were performed. Provider list was updated and health plan was provided to the patient.  

## 2015-06-11 NOTE — Progress Notes (Signed)
Subjective:    Patient ID: Daniel Barber, male    DOB: [birth year], 65 y.o.   MRN: [id number]  No chief complaint on file.   HPI:  Daniel Barber is a 65 y.o. male who presents today for an annual wellness visit.   1) Health Maintenance -   Diet - Averages about 2 meals per day of consisting of fruits, vegetables, chicken, beef, pork; Caffeine intake of 2-3 cups of per day  Exercise - Regularly; Mixture of walking and resistance training at the [facility]; Silver sneakers.  2) Preventative Exams / Immunizations:  Dental -- Due for exam  Vision -- Up to date   Health Maintenance  Topic Date Due  . HIV Screening  07/20/1965  . INFLUENZA VACCINE  08/20/2015  . PNA vac Low Risk Adult (2 of 2 - PPSV23) 12/19/2015  . TETANUS/TDAP  05/29/2024  . COLONOSCOPY  08/05/2024  . ZOSTAVAX  Completed  . Hepatitis C Screening  Completed     Immunization History  Administered Date(s) Administered  . Influenza, High Dose Seasonal PF 12/05/2014  . Pneumococcal Conjugate-13 12/19/2014  . Td 05/30/2014  . Zoster 05/30/2014    RISK FACTORS  Tobacco History  Smoking status  . Former Smoker  Smokeless tobacco  . Never Used     Cardiac risk factors: advanced age (older than 31 for men, 75 for women), male gender and obesity (BMI >= 30 kg/m2).  Depression Screen  Q1: Over the past two weeks, have you felt down, depressed or hopeless? No  Q2: Over the past two weeks, have you felt little interest or pleasure in doing things? No  Have you lost interest or pleasure in daily life? No  Do you often feel hopeless? No  Do you cry easily over simple problems? No  Activities of Daily Living In your present state of health, do you have any difficulty performing the following activities?:  Driving? No Managing money?  No Feeding yourself? No Getting from bed to chair? No Climbing a flight of stairs? No Preparing food and eating?: No Bathing or showering? No Getting dressed:  No Getting to the toilet? No Using the toilet: No Moving around from place to place: No In the past year have you fallen or had a near fall?:No   Home Safety Has smoke detector and wears seat belts. No firearms. No excess sun exposure. Are there smokers in your home (other than you)?  No Do you feel safe at home?  Yes  Hearing Difficulties: No Do you often ask people to speak up or repeat themselves? No Do you experience ringing or noises in your ears? No  Do you have difficulty understanding soft or whispered voices? No    Cognitive Testing  Alert? Yes   Normal Appearance? Yes  Oriented to person? Yes  Place? Yes   Time? Yes  Recall of three objects?  Yes  Can perform simple calculations? Yes  Displays appropriate judgment? Yes  Can read the correct time from a watch face? Yes  Do you feel that you have a problem with memory? No  Do you often misplace items? No   Advanced Directives have been discussed with the patient? Yes  Current Physicians/Providers and Suppliers  1. Terri Piedra, FNP - Internal Medicine 2. Dr. Ronnald Ramp - Dermatology    Indicate any recent Medical Services you may have received from other than [facility] providers in the past year (date may be approximate).  All answers were reviewed with the patient  and necessary referrals were made:  Mauricio Po, [place]   06/11/2015    No Known Allergies   Outpatient Prescriptions Prior to Visit  Medication Sig Dispense Refill  . naproxen sodium (ANAPROX) 220 MG tablet Take 220 mg by mouth 2 (two) times daily with a meal.    . omeprazole (PRILOSEC) 10 MG capsule Take 10 mg by mouth daily.    Marland Kitchen azithromycin (ZITHROMAX Z-PAK) 250 MG tablet Take as directed on pack 6 tablet 0  . guaiFENesin-codeine (ROBITUSSIN AC) 100-10 MG/5ML syrup Take 10 mLs by mouth 4 (four) times daily as needed for cough. 180 mL 0  . ipratropium (ATROVENT) 0.06 % nasal spray Place 2 sprays into both nostrils 4 (four) times daily. 15 mL 1   No  facility-administered medications prior to visit.     Past Medical History  Diagnosis Date  . Prostate cancer ([place])   . Melanoma ([place])   . COPD (chronic obstructive pulmonary disease) [facility])      Past Surgical History  Procedure Laterality Date  . Biopsy prostate    . Arthroscopy right knee    . Arthroscopy right elbow       Family History  Problem Relation Age of Onset  . Colon cancer Mother   . Heart attack Father   . Healthy Maternal Grandmother   . Healthy Maternal Grandfather   . Healthy Paternal Grandmother   . Healthy Paternal Grandfather      Social History   Social History  . Marital Status: Married    Spouse Name: N/A  . Number of Children: 2  . 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 Smoking status: Former Research scientist (life sciences)  . Smokeless tobacco: Never Used  . Alcohol Use: 4.2 oz/week    7 Glasses of wine per week  . Drug Use: No  . Sexual Activity: Not on file   Other Topics Concern  . Not on file   Social History Narrative   Fun: Fish, shooting, antique cars   Denies religious beliefs effecting health care.      Review of Systems  Constitutional: Denies fever, chills, fatigue, or significant weight gain/loss. HENT: Head: Denies headache or neck pain Ears: Denies changes in hearing, ringing in ears, earache, drainage Nose: Denies discharge, stuffiness, itching, nosebleed, sinus pain Throat: Denies sore throat, hoarseness, dry mouth, sores, thrush Eyes: Denies loss/changes in vision, pain, redness, blurry/double vision, flashing lights Cardiovascular: Denies chest pain/discomfort, tightness, palpitations, shortness of breath with activity, difficulty lying down, swelling, sudden awakening with shortness of breath Respiratory: Denies shortness of breath, cough, sputum production, wheezing Gastrointestinal: Denies dysphasia, heartburn, change in appetite, nausea, change in bowel habits,  rectal bleeding, constipation, diarrhea, yellow skin or eyes Genitourinary: Denies frequency, urgency, burning/pain, blood in urine, incontinence, change in urinary strength. Musculoskeletal: Denies muscle/joint pain, stiffness, back pain, redness or swelling of joints, trauma Skin: Denies rashes, lumps, itching, dryness, color changes, or hair/nail changes Neurological: Denies dizziness, fainting, seizures, weakness, numbness, tingling, tremor Psychiatric - Denies nervousness, stress, depression or memory loss Endocrine: Denies heat or cold intolerance, sweating, frequent urination, excessive thirst, changes in appetite Hematologic: Denies ease of bruising or bleeding    Objective:     BP 134/88 mmHg  Pulse 60  Temp(Src) 97.5 F (36.4 C) (Oral)  Resp 16  Ht 6' (1.829 m)  Wt 246 lb (111.585 kg)  BMI 33.36 kg/m2  SpO2 97% Nursing note and vital signs  reviewed. Corrected vision 20/15 for left, right, and bilateral.   Physical Exam  Constitutional: He is oriented to person, place, and time. He appears well-developed and well-nourished.  HENT:  Head: Normocephalic.  Right Ear: Hearing, tympanic membrane, external ear and ear canal normal.  Left Ear: Hearing, tympanic membrane, external ear and ear canal normal.  Nose: Nose normal.  Mouth/Throat: Uvula is midline, oropharynx is clear and moist and mucous membranes are normal.  Eyes: Conjunctivae and EOM are normal. Pupils are equal, round, and reactive to light.  Neck: Neck supple. No JVD present. No tracheal deviation present. No thyromegaly present.  Cardiovascular: Normal rate, regular rhythm, normal heart sounds and intact distal pulses.   Pulmonary/Chest: Effort normal and breath sounds normal.  Abdominal: Soft. Bowel sounds are normal. He exhibits no distension and no mass. There is no tenderness. There is no rebound and no guarding.  Musculoskeletal: Normal range of motion. He exhibits no edema or tenderness.  Lymphadenopathy:     He has no cervical adenopathy.  Neurological: He is alert and oriented to person, place, and time. He has normal reflexes. No cranial nerve deficit. He exhibits normal muscle tone. Coordination normal.  Skin: Skin is warm and dry.  Psychiatric: He has a normal mood and affect. His behavior is normal. Judgment and thought content normal.       Assessment & Plan:   During the course of the visit the patient was educated and counseled about appropriate screening and preventive services including:    Pneumococcal vaccine   Influenza vaccine  Prostate cancer screening  Colorectal cancer screening  Nutrition counseling   Diet review for nutrition referral? Yes ____  Not Indicated _X___   Patient Instructions (the written plan) was given to the patient.  Medicare Attestation I have personally reviewed: The patient's medical and social history Their use of alcohol, tobacco or illicit drugs Their current medications and supplements The patient's functional ability including ADLs,fall risks, home safety risks, cognitive, and hearing and visual impairment Diet and physical activities Evidence for depression or mood disorders  The patient's weight, height, BMI,  have been recorded in the chart.  I have made referrals, counseling, and provided education to the patient based on review of the above and I have provided the patient with a written personalized care plan for preventive services.     Mauricio Po, FNP   06/11/2015    Problem List Items Addressed This Visit      Other   Routine general medical examination at a health care facility    1) Anticipatory Guidance: Discussed importance of wearing a seatbelt while driving and not texting while driving; changing batteries in smoke detector at least once annually; wearing suntan lotion when outside; eating a balanced and moderate diet; getting physical activity at least 30 minutes per day.  2) Immunizations / Screenings /  Labs:  Due for a pneumovax which will be scheduled. All other immunizations are up to date per recommendations. Obtain PSA for prostate cancer screening. All other screenings are up to date per recommendations. Obtain CBC, CMET, and Lipid profile.  Overall well exam with risk factors for cardiovascular disease including obesity. Recommend continued physical activity and nutritional intake that is moderate, balance, and varied and low in saturated fats and processed/sugary foods. Goal weight loss approximate 5-10% of current body weight. Continue other healthy lifestyle behaviors and choices. Follow-up prevention exam in 1 year. Follow-up office visit pending blood work if necessary.  Relevant Orders   PSA   CBC   Comprehensive metabolic panel   Lipid panel   Medicare annual wellness visit, initial - Primary    Reviewed and updated patient's medical, surgical, family and social history. Medications and allergies were also reviewed. Basic screenings for depression, activities of daily living, hearing, cognition and safety were performed. Provider list was updated and health plan was provided to the patient.

## 2015-06-11 NOTE — Progress Notes (Signed)
Pre visit review using our clinic review tool, if applicable. No additional management support is needed unless otherwise documented below in the visit note. 

## 2015-06-12 ENCOUNTER — Encounter: Payer: Self-pay | Admitting: Family

## 2015-06-12 LAB — COMPREHENSIVE METABOLIC PANEL
ALT: 26 U/L (ref 0–53)
AST: 29 U/L (ref 0–37)
Albumin: 4.3 g/dL (ref 3.5–5.2)
Alkaline Phosphatase: 87 U/L (ref 39–117)
BILIRUBIN TOTAL: 0.8 mg/dL (ref 0.2–1.2)
BUN: 16 mg/dL (ref 6–23)
CALCIUM: 9.3 mg/dL (ref 8.4–10.5)
CO2: 23 meq/L (ref 19–32)
Chloride: 105 mEq/L (ref 96–112)
Creatinine, Ser: 1.07 mg/dL (ref 0.40–1.50)
GFR: 73.52 mL/min (ref >60.00)
Glucose, Bld: 91 mg/dL (ref 70–99)
POTASSIUM: 4.8 meq/L (ref 3.5–5.1)
Sodium: 137 mEq/L (ref 135–145)
Total Protein: 6.6 g/dL (ref 6.0–8.3)

## 2015-06-12 LAB — LIPID PANEL
CHOLESTEROL: 212 mg/dL — AB (ref 0–200)
HDL: 47.4 mg/dL (ref >39.00)
LDL CALC: 150 mg/dL — AB (ref 0–99)
NonHDL: 164.28
TRIGLYCERIDES: 73 mg/dL (ref 0.0–149.0)
Total CHOL/HDL Ratio: 4
VLDL: 14.6 mg/dL (ref 0.0–40.0)

## 2015-06-12 LAB — PSA: PSA: 0.05 ng/mL — AB (ref 0.10–4.00)

## 2015-09-27 DIAGNOSIS — H18413 Arcus senilis, bilateral: Secondary | ICD-10-CM | POA: Diagnosis not present

## 2015-09-27 DIAGNOSIS — H2513 Age-related nuclear cataract, bilateral: Secondary | ICD-10-CM | POA: Diagnosis not present

## 2015-09-27 DIAGNOSIS — H25013 Cortical age-related cataract, bilateral: Secondary | ICD-10-CM | POA: Diagnosis not present

## 2015-09-27 DIAGNOSIS — H11423 Conjunctival edema, bilateral: Secondary | ICD-10-CM | POA: Diagnosis not present

## 2015-09-27 DIAGNOSIS — H40023 Open angle with borderline findings, high risk, bilateral: Secondary | ICD-10-CM | POA: Diagnosis not present

## 2015-09-27 DIAGNOSIS — H40033 Anatomical narrow angle, bilateral: Secondary | ICD-10-CM | POA: Diagnosis not present

## 2015-09-27 DIAGNOSIS — H5203 Hypermetropia, bilateral: Secondary | ICD-10-CM | POA: Diagnosis not present

## 2015-09-27 DIAGNOSIS — H04123 Dry eye syndrome of bilateral lacrimal glands: Secondary | ICD-10-CM | POA: Diagnosis not present

## 2015-09-27 DIAGNOSIS — H524 Presbyopia: Secondary | ICD-10-CM | POA: Diagnosis not present

## 2015-09-27 DIAGNOSIS — H11153 Pinguecula, bilateral: Secondary | ICD-10-CM | POA: Diagnosis not present

## 2015-09-27 DIAGNOSIS — H52223 Regular astigmatism, bilateral: Secondary | ICD-10-CM | POA: Diagnosis not present

## 2015-09-27 DIAGNOSIS — H3589 Other specified retinal disorders: Secondary | ICD-10-CM | POA: Diagnosis not present

## 2015-10-02 DIAGNOSIS — H2513 Age-related nuclear cataract, bilateral: Secondary | ICD-10-CM | POA: Diagnosis not present

## 2015-10-02 DIAGNOSIS — H21541 Posterior synechiae (iris), right eye: Secondary | ICD-10-CM | POA: Diagnosis not present

## 2015-10-02 DIAGNOSIS — H40033 Anatomical narrow angle, bilateral: Secondary | ICD-10-CM | POA: Diagnosis not present

## 2015-10-14 DIAGNOSIS — H40033 Anatomical narrow angle, bilateral: Secondary | ICD-10-CM | POA: Diagnosis not present

## 2015-11-25 ENCOUNTER — Ambulatory Visit (INDEPENDENT_AMBULATORY_CARE_PROVIDER_SITE_OTHER): Payer: Medicare Other | Admitting: Family

## 2015-11-25 VITALS — BP 128/88 | HR 81 | Temp 98.2°F | Resp 16 | Ht 72.0 in | Wt 253.0 lb

## 2015-11-25 DIAGNOSIS — J069 Acute upper respiratory infection, unspecified: Secondary | ICD-10-CM | POA: Insufficient documentation

## 2015-11-25 DIAGNOSIS — Z23 Encounter for immunization: Secondary | ICD-10-CM | POA: Diagnosis not present

## 2015-11-25 MED ORDER — CEFUROXIME AXETIL 500 MG PO TABS: 500.0000 mg | ORAL_TABLET | Freq: Two times a day (BID) | ORAL | 0 refills | Status: DC

## 2015-11-25 NOTE — Assessment & Plan Note (Signed)
Symptoms and exam consistent with acute upper respiratory infection most likely viral at this point. Treat conservatively with over-the-counter medications as needed for symptom relief and supportive care. Written prescription for cefuroxime provided if symptoms worsen or do not improve in the next 3-5 days.

## 2015-11-25 NOTE — Progress Notes (Signed)
Subjective:    Patient ID: Daniel Barber, male    DOB: 1950/09/15, 66 y.o.   MRN: [id number]  Chief Complaint  Patient presents with  . Cough    x4 days, sore throat, headache, sinus issues, cough, little bit of diarrhea    HPI:  Daniel Barber is a 64 y.o. male who  has a past medical history of COPD (chronic obstructive pulmonary disease) ([place]); Melanoma ([place]); and Prostate cancer ([place]). and presents today for an acute office visit.  This is a new problem. Associated symptoms of sore throat, headache, sinus issues and cough have been going on for about 4 days. Express he has had some looser stools as well. No fevers. Frequency of bowel movements have been around 4 per day. Recently returning from [place]. Symptoms are generally worsening since initial onset. Modifying factors include Nyquil, Dayquil, BC Powder and Zycam which has helped a little with the symptoms. No recent antibiotics. No sick contacts.   No Known Allergies    Outpatient Medications Prior to Visit  Medication Sig Dispense Refill  . naproxen sodium (ANAPROX) 220 MG tablet Take 220 mg by mouth 2 (two) times daily with a meal.    . omeprazole (PRILOSEC) 10 MG capsule Take 10 mg by mouth daily.     No facility-administered medications prior to visit.       Past Surgical History:  Procedure Laterality Date  . arthroscopy right elbow    . arthroscopy right knee    . BIOPSY PROSTATE        Past Medical History:  Diagnosis Date  . COPD (chronic obstructive pulmonary disease) ([place])   . Melanoma ([place])   . Prostate cancer ([place])       Review of Systems  HENT: Positive for congestion, sinus pain, sinus pressure and sore throat. Negative for ear pain and sneezing.   Respiratory: Positive for cough and wheezing. Negative for chest tightness and shortness of breath.   Neurological: Positive for headaches.      Objective:    BP 128/88 (BP Location: Left Arm, Patient Position: Sitting, Cuff Size:  Normal)   Pulse 81   Temp 98.2 F (36.8 C) (Oral)   Resp 16   Ht 6' (1.829 m)   Wt 253 lb (114.8 kg)   SpO2 95%   BMI 34.31 kg/m  Nursing note and vital signs reviewed.  Physical Exam  Constitutional: He is oriented to person, place, and time. He appears well-developed and well-nourished. No distress.  HENT:  Right Ear: Hearing, tympanic membrane, external ear and ear canal normal.  Left Ear: Hearing, tympanic membrane, external ear and ear canal normal.  Nose: Nose normal. Right sinus exhibits no maxillary sinus tenderness and no frontal sinus tenderness. Left sinus exhibits no maxillary sinus tenderness and no frontal sinus tenderness.  Mouth/Throat: Uvula is midline and mucous membranes are normal.  Cardiovascular: Normal rate, regular rhythm, normal heart sounds and intact distal pulses.   Pulmonary/Chest: Effort normal and breath sounds normal.  Neurological: He is alert and oriented to person, place, and time.  Skin: Skin is warm and dry.  Psychiatric: He has a normal mood and affect. His behavior is normal. Judgment and thought content normal.       Assessment & Plan:   Problem List Items Addressed This Visit      Respiratory   Acute upper respiratory infection - Primary    Symptoms and exam consistent with acute upper respiratory infection most likely viral at this  point. Treat conservatively with over-the-counter medications as needed for symptom relief and supportive care. Written prescription for cefuroxime provided if symptoms worsen or do not improve in the next 3-5 days.      Relevant Medications   cefUROXime (CEFTIN) 500 MG tablet    Other Visit Diagnoses    Encounter for immunization       Relevant Orders   Flu vaccine HIGH DOSE PF (Completed)       I am having Mr. Sela start on cefUROXime. I am also having him maintain his naproxen sodium and omeprazole.   Meds ordered this encounter  Medications  . cefUROXime (CEFTIN) 500 MG tablet    Sig:  Take 1 tablet (500 mg total) by mouth 2 (two) times daily with a meal.    Dispense:  20 tablet    Refill:  0    Order Specific Question:   Supervising Provider    Answer:   Pricilla Holm A [id number]     Follow-up: Return if symptoms worsen or fail to improve.  Mauricio Po, FNP

## 2015-11-25 NOTE — Patient Instructions (Signed)
Thank you for choosing [facility].  SUMMARY AND INSTRUCTIONS:  Medication:  Start the Ceftin if your symptoms do not improve.   Your prescription(s) have been submitted to your pharmacy or been printed and provided for you. Please take as directed and contact our office if you believe you are having problem(s) with the medication(s) or have any questions.  Follow up:  If your symptoms worsen or fail to improve, please contact our office for further instruction, or in case of emergency go directly to the emergency room at the closest medical facility.   General Recommendations:    Please drink plenty of fluids.  Get plenty of rest   Sleep in humidified air  Use saline nasal sprays  Netti pot   OTC Medications:  Decongestants - helps relieve congestion   Flonase (generic fluticasone) or Nasacort (generic triamcinolone) - please make sure to use the "cross-over" technique at a 45 degree angle towards the opposite eye as opposed to straight up the nasal passageway.   Sudafed (generic pseudoephedrine - Note this is the one that is available behind the pharmacy counter); Products with phenylephrine (-PE) may also be used but is often not as effective as pseudoephedrine.   If you have HIGH BLOOD PRESSURE - Coricidin HBP; AVOID any product that is -D as this contains pseudoephedrine which may increase your blood pressure.  Afrin (oxymetazoline) every 6-8 hours for up to 3 days.   Allergies - helps relieve runny nose, itchy eyes and sneezing   Claritin (generic loratidine), Allegra (fexofenidine), or Zyrtec (generic cyrterizine) for runny nose. These medications should not cause drowsiness.  Note - Benadryl (generic diphenhydramine) may be used however may cause drowsiness  Cough -   Delsym or Robitussin (generic dextromethorphan)  Expectorants - helps loosen mucus to ease removal   Mucinex (generic guaifenesin) as directed on the package.  Headaches / General  Aches   Tylenol (generic acetaminophen) - DO NOT EXCEED 3 grams (3,000 mg) in a 24 hour time period  Advil/Motrin (generic ibuprofen)   Sore Throat -   Salt water gargle   Chloraseptic (generic benzocaine) spray or lozenges / Sucrets (generic dyclonine)

## 2015-12-30 DIAGNOSIS — Z85820 Personal history of malignant melanoma of skin: Secondary | ICD-10-CM | POA: Diagnosis not present

## 2015-12-30 DIAGNOSIS — L821 Other seborrheic keratosis: Secondary | ICD-10-CM | POA: Diagnosis not present

## 2015-12-30 DIAGNOSIS — L570 Actinic keratosis: Secondary | ICD-10-CM | POA: Diagnosis not present

## 2016-03-17 ENCOUNTER — Encounter: Payer: Self-pay | Admitting: Family

## 2016-03-17 ENCOUNTER — Ambulatory Visit (INDEPENDENT_AMBULATORY_CARE_PROVIDER_SITE_OTHER): Payer: Medicare Other | Admitting: Family

## 2016-03-17 DIAGNOSIS — K640 First degree hemorrhoids: Secondary | ICD-10-CM

## 2016-03-17 DIAGNOSIS — K649 Unspecified hemorrhoids: Secondary | ICD-10-CM | POA: Insufficient documentation

## 2016-03-17 MED ORDER — HYDROCORTISONE ACETATE 25 MG RE SUPP: 25.0000 mg | Freq: Two times a day (BID) | RECTAL | 0 refills | Status: DC

## 2016-03-17 NOTE — Patient Instructions (Signed)
Thank you for choosing [facility].  SUMMARY AND INSTRUCTIONS:  Medication:  Start the Anusol suppositories.  Continue OTC medications as needed.   Your prescription(s) have been submitted to your pharmacy or been printed and provided for you. Please take as directed and contact our office if you believe you are having problem(s) with the medication(s) or have any questions.  Follow up:  If your symptoms worsen or fail to improve, please contact our office for further instruction, or in case of emergency go directly to the emergency room at the closest medical facility.     Hemorrhoids Hemorrhoids are swollen veins in and around the rectum or anus. There are two types of hemorrhoids:  Internal hemorrhoids. These occur in the veins that are just inside the rectum. They may poke through to the outside and become irritated and painful.  External hemorrhoids. These occur in the veins that are outside of the anus and can be felt as a painful swelling or hard lump near the anus. Most hemorrhoids do not cause serious problems, and they can be managed with home treatments such as diet and lifestyle changes. If home treatments do not help your symptoms, procedures can be done to shrink or remove the hemorrhoids. What are the causes? This condition is caused by increased pressure in the anal area. This pressure may result from various things, including:  Constipation.  Straining to have a bowel movement.  Diarrhea.  Pregnancy.  Obesity.  Sitting for long periods of time.  Heavy lifting or other activity that causes you to strain.  Anal sex. What are the signs or symptoms? Symptoms of this condition include:  Pain.  Anal itching or irritation.  Rectal bleeding.  Leakage of stool (feces).  Anal swelling.  One or more lumps around the anus. How is this diagnosed? This condition can often be diagnosed through a visual exam. Other exams or tests may also be done,  such as:  Examination of the rectal area with a gloved hand (digital rectal exam).  Examination of the anal canal using a small tube (anoscope).  A blood test, if you have lost a significant amount of blood.  A test to look inside the colon (sigmoidoscopy or colonoscopy). How is this treated? This condition can usually be treated at home. However, various procedures may be done if dietary changes, lifestyle changes, and other home treatments do not help your symptoms. These procedures can help make the hemorrhoids smaller or remove them completely. Some of these procedures involve surgery, and others do not. Common procedures include:  Rubber band ligation. Rubber bands are placed at the base of the hemorrhoids to cut off the blood supply to them.  Sclerotherapy. Medicine is injected into the hemorrhoids to shrink them.  Infrared coagulation. A type of light energy is used to get rid of the hemorrhoids.  Hemorrhoidectomy surgery. The hemorrhoids are surgically removed, and the veins that supply them are tied off.  Stapled hemorrhoidopexy surgery. A circular stapling device is used to remove the hemorrhoids and use staples to cut off the blood supply to them. Follow these instructions at home: Eating and drinking   Eat foods that have a lot of fiber in them, such as whole grains, beans, nuts, fruits, and vegetables. Ask your health care provider about taking products that have added fiber (fiber supplements).  Drink enough fluid to keep your urine clear or pale yellow. Managing pain and swelling   Take warm sitz baths for 20 minutes, 3-4 times a  day to ease pain and discomfort.  If directed, apply ice to the affected area. Using ice packs between sitz baths may be helpful.  Put ice in a plastic bag.  Place a towel between your skin and the bag.  Leave the ice on for 20 minutes, 2-3 times a day. General instructions   Take over-the-counter and prescription medicines only as told  by your health care provider.  Use medicated creams or suppositories as told.  Exercise regularly.  Go to the bathroom when you have the urge to have a bowel movement. Do not wait.  Avoid straining to have bowel movements.  Keep the anal area dry and clean. Use wet toilet paper or moist towelettes after a bowel movement.  Do not sit on the toilet for long periods of time. This increases blood pooling and pain. Contact a health care provider if:  You have increasing pain and swelling that are not controlled by treatment or medicine.  You have uncontrolled bleeding.  You have difficulty having a bowel movement, or you are unable to have a bowel movement.  You have pain or inflammation outside the area of the hemorrhoids. This information is not intended to replace advice given to you by your health care provider. Make sure you discuss any questions you have with your health care provider. Document Released: 01/03/2000 Document Revised: 06/05/2015 Document Reviewed: 09/19/2014 Elsevier Interactive Patient Education  2017 [employer].

## 2016-03-17 NOTE — Assessment & Plan Note (Signed)
Symptoms and exam consistent with first degree hemorrhoid refractory to OTC medications. Start Anusol. Discussed good bowel hygiene. Encouraged to avoid constipated.  Follow up as needed.

## 2016-03-17 NOTE — Progress Notes (Signed)
Subjective:    Patient ID: Daniel Barber, male    DOB: 04/02/1950, 66 y.o.   MRN: [id number]  Chief Complaint  Patient presents with  . Hemorrhoids    has been bothering him on and off since january, would like something stronger than OTC    HPI:  Daniel Barber is a 66 y.o. male who  has a past medical history of COPD (chronic obstructive pulmonary disease) ([place]); Melanoma ([place]); and Prostate cancer ([place]). and presents today for an office visit.   This is a new problem. Associated symptom of hemorrhoids located around his rectum has been going on for about 1.5 months following a bout of the stomach flu. Describes itching and burning with gradual improvement and decreased bright red blood. Modifying factors include lidocaine and Preparation H. Overall course of the symptoms is improving. Endorse normal easy to pass bowel movements.    No Known Allergies    Outpatient Medications Prior to Visit  Medication Sig Dispense Refill  . cefUROXime (CEFTIN) 500 MG tablet Take 1 tablet (500 mg total) by mouth 2 (two) times daily with a meal. 20 tablet 0  . naproxen sodium (ANAPROX) 220 MG tablet Take 220 mg by mouth 2 (two) times daily with a meal.    . omeprazole (PRILOSEC) 10 MG capsule Take 10 mg by mouth daily.     No facility-administered medications prior to visit.      Review of Systems  Constitutional: Negative for chills and fever.  Gastrointestinal: Negative for anal bleeding, blood in stool, constipation and diarrhea.       Positive for hemorrhoid.       Objective:    BP (!) 146/90 (BP Location: Left Arm, Patient Position: Sitting, Cuff Size: Large)   Pulse (!) 52   Temp 98.1 F (36.7 C) (Oral)   Resp 16   Ht 6' (1.829 m)   Wt 256 lb 1.9 oz (116.2 kg)   SpO2 96%   BMI 34.74 kg/m  Nursing note and vital signs reviewed.  Physical Exam  Constitutional: He is oriented to person, place, and time. He appears well-developed and well-nourished. No distress.    Cardiovascular: Normal rate, regular rhythm, normal heart sounds and intact distal pulses.   Pulmonary/Chest: Effort normal and breath sounds normal.  Genitourinary: Rectal exam shows internal hemorrhoid. Rectal exam shows no fissure, no mass and no tenderness.  Neurological: He is alert and oriented to person, place, and time.  Skin: Skin is warm and dry.  Psychiatric: He has a normal mood and affect. His behavior is normal. Judgment and thought content normal.       Assessment & Plan:   Problem List Items Addressed This Visit      Cardiovascular and Mediastinum   Hemorrhoid    Symptoms and exam consistent with first degree hemorrhoid refractory to OTC medications. Start Anusol. Discussed good bowel hygiene. Encouraged to avoid constipated.  Follow up as needed.       Relevant Medications   hydrocortisone (ANUSOL-HC) 25 MG suppository       I am having [name] start on hydrocortisone. I am also having him maintain his naproxen sodium, omeprazole, and cefUROXime.   Meds ordered this encounter  Medications  . hydrocortisone (ANUSOL-HC) 25 MG suppository    Sig: Place 1 suppository (25 mg total) rectally 2 (two) times daily.    Dispense:  12 suppository    Refill:  0    Order Specific Question:   Supervising Provider  Answer:   Pricilla Holm A [[id number]]     Follow-up: Return if symptoms worsen or fail to improve.  Mauricio Po, FNP

## 2016-04-03 ENCOUNTER — Other Ambulatory Visit: Payer: Self-pay | Admitting: Family

## 2016-04-03 DIAGNOSIS — K640 First degree hemorrhoids: Secondary | ICD-10-CM

## 2016-04-03 MED ORDER — HYDROCORTISONE ACETATE 25 MG RE SUPP: 25.0000 mg | Freq: Two times a day (BID) | RECTAL | 0 refills | Status: DC

## 2016-06-18 ENCOUNTER — Ambulatory Visit (INDEPENDENT_AMBULATORY_CARE_PROVIDER_SITE_OTHER): Payer: Medicare Other | Admitting: Family

## 2016-06-18 ENCOUNTER — Encounter: Payer: Self-pay | Admitting: Family

## 2016-06-18 VITALS — BP 144/84 | HR 63 | Temp 97.8°F | Resp 16 | Ht 72.0 in | Wt 252.8 lb

## 2016-06-18 DIAGNOSIS — K644 Residual hemorrhoidal skin tags: Secondary | ICD-10-CM | POA: Diagnosis not present

## 2016-06-18 DIAGNOSIS — Z23 Encounter for immunization: Secondary | ICD-10-CM

## 2016-06-18 MED ORDER — HYDROCORTISONE 2.5 % RE CREA: 1.0000 "application " | TOPICAL_CREAM | Freq: Two times a day (BID) | RECTAL | 1 refills | Status: DC

## 2016-06-18 NOTE — Patient Instructions (Signed)
Thank you for choosing [facility].  SUMMARY AND INSTRUCTIONS:  Continue to use the Anusol cream  Avoid constipation.  Try baby wipes as needed.  They will call with your referral to gastroenterology.  Medication:  Your prescription(s) have been submitted to your pharmacy or been printed and provided for you. Please take as directed and contact our office if you believe you are having problem(s) with the medication(s) or have any questions.  Follow up:  If your symptoms worsen or fail to improve, please contact our office for further instruction, or in case of emergency go directly to the emergency room at the closest medical facility.

## 2016-06-18 NOTE — Assessment & Plan Note (Signed)
External hemorrhoid without evidence of thrombosis. Change Anusol suppository to cream. Continue with good rectal hygiene and avoid constipation. Referral to GI placed for further evaluation and treatment.

## 2016-06-18 NOTE — Progress Notes (Signed)
Subjective:    Patient ID: Daniel Barber, male    DOB: May 19, 1950, 66 y.o.   MRN: [id number]  Chief Complaint  Patient presents with  . Follow-up    hemorrhoids    HPI:  Daniel Barber is a 66 y.o. male who  has a past medical history of COPD (chronic obstructive pulmonary disease) ([place]); Melanoma ([place]); and Prostate cancer ([place]). and presents today for a follow up office visit.   1.) Hemorrhoids - Continues to have bright red blood secondary to hemorrhoids which is currently treated with Anusol suppository which do provide some relief. Did have a larger than normal amount of blood with a bowel movement. Trying to use good rectal hygiene. Bowel movements are easy to pass.  Does continue to experience the associated symptoms of burning and itching.   No Known Allergies   Outpatient Medications Prior to Visit  Medication Sig Dispense Refill  . cefUROXime (CEFTIN) 500 MG tablet Take 1 tablet (500 mg total) by mouth 2 (two) times daily with a meal. 20 tablet 0  . naproxen sodium (ANAPROX) 220 MG tablet Take 220 mg by mouth 2 (two) times daily with a meal.    . omeprazole (PRILOSEC) 10 MG capsule Take 10 mg by mouth daily.    . hydrocortisone (ANUSOL-HC) 25 MG suppository Place 1 suppository (25 mg total) rectally 2 (two) times daily. 12 suppository 0   No facility-administered medications prior to visit.     Review of Systems  Constitutional: Negative for chills and fever.  Gastrointestinal: Positive for blood in stool and rectal pain.      Objective:    BP (!) 144/84 (BP Location: Left Arm, Patient Position: Sitting, Cuff Size: Large)   Pulse 63   Temp 97.8 F (36.6 C) (Oral)   Resp 16   Ht 6' (1.829 m)   Wt 252 lb 12.8 oz (114.7 kg)   SpO2 92%   BMI 34.29 kg/m  Nursing note and vital signs reviewed.  Physical Exam  Constitutional: He is oriented to person, place, and time. He appears well-developed and well-nourished. No distress.  Cardiovascular: Normal  rate, regular rhythm, normal heart sounds and intact distal pulses.   Pulmonary/Chest: Effort normal and breath sounds normal.  Genitourinary:  Genitourinary Comments: External hemorrhoid appears without thrombosis and mildly tender. No fissures, masses or lesions.   Neurological: He is alert and oriented to person, place, and time.  Skin: Skin is warm and dry.  Psychiatric: He has a normal mood and affect. His behavior is normal. Judgment and thought content normal.       Assessment & Plan:   Problem List Items Addressed This Visit      Cardiovascular and Mediastinum   Hemorrhoid - Primary    External hemorrhoid without evidence of thrombosis. Change Anusol suppository to cream. Continue with good rectal hygiene and avoid constipation. Referral to GI placed for further evaluation and treatment.        Other Visit Diagnoses    Need for 23-polyvalent pneumococcal polysaccharide vaccine       Relevant Orders   Pneumococcal polysaccharide vaccine 23-valent greater than or equal to 2yo subcutaneous/IM (Completed)       I have discontinued [name]'s hydrocortisone. I am also having him start on hydrocortisone. Additionally, I am having him maintain his naproxen sodium, omeprazole, and cefUROXime.   Meds ordered this encounter  Medications  . hydrocortisone (ANUSOL-HC) 2.5 % rectal cream    Sig: Place 1 application rectally 2 (  two) times daily.    Dispense:  30 g    Refill:  1    Order Specific Question:   Supervising Provider    Answer:   Pricilla Holm A [[id number]]     Follow-up: Return if symptoms worsen or fail to improve.  Mauricio Po, FNP

## 2016-06-26 DIAGNOSIS — K644 Residual hemorrhoidal skin tags: Secondary | ICD-10-CM | POA: Diagnosis not present

## 2016-07-17 ENCOUNTER — Ambulatory Visit: Payer: Self-pay | Admitting: Surgery

## 2016-07-17 DIAGNOSIS — K649 Unspecified hemorrhoids: Secondary | ICD-10-CM | POA: Diagnosis not present

## 2016-07-17 NOTE — H&P (Signed)
Daniel Barber 07/17/2016 1:43 PM Location: [facility] Patient #: [id number] DOB: 15-Dec-1950 Married / Language: Daniel Barber / Race: White Male  History of Present Illness (Daniel Barber A. Daniel Heller MD; 07/17/2016 2:13 PM) Patient words: This is a very pleasant 66 year old gentleman who is referred by Dr. Watt Barber for symptomatic hemorrhoids. He has had hemorrhoids for many years with intermittent flares. He has had a couple of thrombosed external hemorrhoids lanced in the past. He does get regular colonoscopies, last in 2016. In December of this past year he had a normal virus infection with severe diarrhea, and since then his hemorrhoids have been persistently more symptomatic. He described pain, itching and bleeding. He reports his bowel function is regular, stating he has soft easy bowel movements without pushing or straining. He spent about 10 minutes on the toilet. He has a pretty elaborate. All hygiene routine with wet paper followed by baby wipes followed by Tucks wipes and then Preparation H or lidocaine topical. He also does sits baths as needed. He is becoming frustrated by hemorrhoids as he seems to get them under control for a little while and they re present themselves. He would like to discuss surgical options. 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 He is going on a vacation to [place] for a [employer] in the fall. He has a history of radical prostatectomy was also arthroscopic surgeries.  The patient is a 66 year old male.   Past Surgical History Daniel Barber, [place]; 07/17/2016 1:43 PM) Colon Polyp Removal - Colonoscopy Knee Surgery Left. Prostate Surgery - Removal  Diagnostic Studies History Daniel Barber, [place]; 07/17/2016 1:43 PM) Colonoscopy 1-5 years ago  Allergies Daniel Barber, RMA; 07/17/2016 1:43 PM) No Known Allergies 07/17/2016  Medication History Daniel Barber, RMA; 07/17/2016 1:44 PM) Anusol-HC (2.5% Cream, Rectal) Active. Naproxen Sodium (220MG  Tablet, Oral) Active. PriLOSEC (10MG  Packet, Oral) Active. Medications Reconciled  Social History Daniel Barber, [place]; 07/17/2016 1:43 PM) Alcohol use Moderate alcohol use. Caffeine use Coffee. No drug use Tobacco use Former smoker.  Family History Daniel Barber, [place]; 07/17/2016 1:43 PM) Arthritis Father. Colon Cancer Mother. Heart Disease Father. Ischemic Bowel Disease Mother.  Other Problems Daniel Barber, [place]; 07/17/2016 1:43 PM) Back Pain Chronic Obstructive Lung Disease Diverticulosis Gastroesophageal Reflux Disease Hemorrhoids Melanoma Prostate Cancer     Review of Systems (Daniel Barber A. Daniel Heller MD; 07/17/2016 2:13 PM) General Not Present- Appetite Loss, Chills, Fatigue, Fever, Night Sweats, Weight Gain and Weight Loss. Skin Not Present- Change in Wart/Mole, Dryness, Hives, Jaundice, New Lesions, Non-Healing Wounds, Rash and Ulcer. HEENT Present- Wears glasses/contact lenses. Not Present- Earache, Hearing Loss, Hoarseness, Nose Bleed, Oral Ulcers, Ringing in the Ears, Seasonal Allergies, Sinus Pain, Sore Throat, Visual Disturbances and Yellow Eyes. Respiratory Not Present- Bloody sputum, Chronic Cough, Difficulty Breathing, Snoring and Wheezing. Breast Not Present- Breast Mass, Breast Pain, Nipple Discharge and Skin Changes. Cardiovascular Present- Leg Cramps. Not Present- Chest Pain, Difficulty Breathing Lying Down, Palpitations, Rapid Heart Rate, Shortness of Breath and Swelling of Extremities. Gastrointestinal Present- Hemorrhoids, Indigestion and Rectal Pain. Not Present- Abdominal Pain, Bloating, Bloody Stool, Change in Bowel Habits, Chronic diarrhea, Constipation, Difficulty Swallowing, Excessive gas, Gets full quickly at meals, Nausea and Vomiting. Male Genitourinary Not Present- Blood in Urine, Change in Urinary Stream, Frequency, Impotence,  Nocturia, Painful Urination, Urgency and Urine Leakage. Musculoskeletal Present- Joint Pain. Not Present- Back Pain, Joint Stiffness, Muscle Pain, Muscle Weakness and Swelling of Extremities. Neurological  Not Present- Decreased Memory, Fainting, Headaches, Numbness, Seizures, Tingling, Tremor, Trouble walking and Weakness. Psychiatric Not Present- Anxiety, Bipolar, Change in Sleep Pattern, Depression, Fearful and Frequent crying. Endocrine Not Present- Cold Intolerance, Excessive Hunger, Hair Changes, Heat Intolerance, Hot flashes and New Diabetes. Hematology Not Present- Blood Thinners, Easy Bruising, Excessive bleeding, Gland problems, HIV and Persistent Infections. All other systems negative  Vitals Daniel Barber RMA; 07/17/2016 1:44 PM) 07/17/2016 1:44 PM Weight: 252.4 lb Height: 72in Body Surface Area: 2.35 m Body Mass Index: 34.23 kg/m  Temp.: 97.15F  Pulse: 65 (Regular)  BP: 132/80 (Sitting, Left Arm, Standard)      Physical Exam (Daniel Barber A. Daniel Heller MD; 07/17/2016 2:17 PM)  General Note: Alert and well-appearing  Integumentary Note: Skin is warm and dry  Head and Neck Note: No mass or thyromegaly  Eye Note: Anicteric. Pupils equal round reactive.  ENMT Note: Moist mucous membranes normal dentition  Chest and Lung Exam Note: Unlabored respirations, no tactile fremitus  Cardiovascular Note: Regular rate and rhythm. No pedal edema  Abdomen Note: Soft, nontender nondistended. No palpable mass or organomegaly  Rectal Note: He has decompressed external hemorrhoids with mild overlying inflammation of the perianal skin. On anoscopic exam he has grade 1 internal disease throughout. No fissures or other lesions. No palpable mass  Neurologic Note: Grossly intact, normal gait  Neuropsychiatric Note: Normal mood and affect, appropriate insight  Musculoskeletal Note: Strength symmetrical throughout, no deformity    Assessment & Plan  (Lark Langenfeld A. Daniel Heller MD; 07/17/2016 2:18 PM)  HEMORRHOIDS (K64.9) Story: He desires excision. We discussed the multiple options including imaging of his bowel regimen. I counseled him to be less aggressive with his perianal hygiene routine and explained the rationale for that. We discussed how to properly do sitz baths and possibly adding in a fiber supplement or MiraLAX to help with his bowel movements. Also discussed spending less than 10 minutes on the toilet. He will adapt some of these behaviors but he does desire surgical excision. I discussed with him the nature of the surgery including the possible publications of bleeding, pelvic sepsis, urinary retention, incontinence or sphincter injury. He quit understanding. He desires to proceed. I described to him that I would excise the left lateral and right posterior complexes does seem to be the most irritated at this time it would leave the right anterior. We will get him certain surgery scheduled in the next few months.

## 2016-08-17 ENCOUNTER — Encounter (HOSPITAL_BASED_OUTPATIENT_CLINIC_OR_DEPARTMENT_OTHER): Payer: Self-pay | Admitting: *Deleted

## 2016-08-17 NOTE — Progress Notes (Signed)
NPO AFTER MN.  ARRIVE AT 0600.  NEEDS EKG.  GETTING LAB WORK DONE PRIOR TO DOS (CBC diff, BMET).  PT VERBALIZED UNDERSTANDING TO DO ONE FLEET ENEMA AM DOS AND DO HIBICLENS SHOWER HS BEFORE AND AM DOS.

## 2016-08-25 DIAGNOSIS — Z87891 Personal history of nicotine dependence: Secondary | ICD-10-CM | POA: Diagnosis not present

## 2016-08-25 DIAGNOSIS — K649 Unspecified hemorrhoids: Secondary | ICD-10-CM | POA: Diagnosis present

## 2016-08-25 DIAGNOSIS — Z791 Long term (current) use of non-steroidal anti-inflammatories (NSAID): Secondary | ICD-10-CM | POA: Diagnosis not present

## 2016-08-25 DIAGNOSIS — K644 Residual hemorrhoidal skin tags: Secondary | ICD-10-CM | POA: Diagnosis not present

## 2016-08-25 DIAGNOSIS — J449 Chronic obstructive pulmonary disease, unspecified: Secondary | ICD-10-CM | POA: Diagnosis not present

## 2016-08-25 DIAGNOSIS — K219 Gastro-esophageal reflux disease without esophagitis: Secondary | ICD-10-CM | POA: Diagnosis not present

## 2016-08-25 LAB — CBC WITH DIFFERENTIAL/PLATELET
Basophils Absolute: 0 10*3/uL (ref 0.0–0.1)
Basophils Relative: 0 %
EOS PCT: 7 %
Eosinophils Absolute: 0.7 10*3/uL (ref 0.0–0.7)
HCT: 46.3 % (ref 39.0–52.0)
Hemoglobin: 16.6 g/dL (ref 13.0–17.0)
LYMPHS ABS: 3.1 10*3/uL (ref 0.7–4.0)
LYMPHS PCT: 34 %
MCH: 32.9 pg (ref 26.0–34.0)
MCHC: 35.9 g/dL (ref 30.0–36.0)
MCV: 91.7 fL (ref 78.0–100.0)
MONO ABS: 0.6 10*3/uL (ref 0.1–1.0)
Monocytes Relative: 7 %
Neutro Abs: 4.7 10*3/uL (ref 1.7–7.7)
Neutrophils Relative %: 52 %
PLATELETS: 185 10*3/uL (ref 150–400)
RBC: 5.05 MIL/uL (ref 4.22–5.81)
RDW: 13.3 % (ref 11.5–15.5)
WBC: 9.1 10*3/uL (ref 4.0–10.5)

## 2016-08-25 LAB — BASIC METABOLIC PANEL
Anion gap: 9 (ref 5–15)
BUN: 20 mg/dL (ref 6–20)
CHLORIDE: 105 mmol/L (ref 101–111)
CO2: 23 mmol/L (ref 22–32)
Calcium: 8.9 mg/dL (ref 8.9–10.3)
Creatinine, Ser: 1.19 mg/dL (ref 0.61–1.24)
GFR calc Af Amer: >60 mL/min (ref >60)
GFR calc non Af Amer: >60 mL/min (ref >60)
GLUCOSE: 108 mg/dL — AB (ref 65–99)
POTASSIUM: 4.3 mmol/L (ref 3.5–5.1)
Sodium: 137 mmol/L (ref 135–145)

## 2016-08-27 NOTE — Anesthesia Preprocedure Evaluation (Addendum)
Anesthesia Evaluation  Patient identified by MRN, date of birth, ID band Patient awake    Reviewed: Allergy & Precautions, NPO status , Patient's Chart, lab work & pertinent test results  Airway Mallampati: II  TM Distance: >3 FB Neck ROM: Full    Dental  (+) Dental Advisory Given   Pulmonary COPD, Current Smoker,    breath sounds clear to auscultation       Cardiovascular negative cardio ROS   Rhythm:Regular Rate:Normal     Neuro/Psych negative neurological ROS     GI/Hepatic Neg liver ROS, GERD  Medicated and Controlled,  Endo/Other  negative endocrine ROS  Renal/GU negative Renal ROS     Musculoskeletal  (+) Arthritis ,   Abdominal   Peds  Hematology negative hematology ROS (+)   Anesthesia Other Findings   Reproductive/Obstetrics                            Anesthesia Physical Anesthesia Plan  ASA: II  Anesthesia Plan: MAC   Post-op Pain Management:    Induction: Intravenous  PONV Risk Score and Plan: 0 and Ondansetron, Dexamethasone and Treatment may vary due to age or medical condition  Airway Management Planned: Natural Airway and Simple Face Mask  Additional Equipment:   Intra-op Plan:   Post-operative Plan: Extubation in OR  Informed Consent: I have reviewed the patients History and Physical, chart, labs and discussed the procedure including the risks, benefits and alternatives for the proposed anesthesia with the patient or authorized representative who has indicated his/her understanding and acceptance.   Dental advisory given  Plan Discussed with: CRNA  Anesthesia Plan Comments:       Anesthesia Quick Evaluation

## 2016-08-28 ENCOUNTER — Encounter (HOSPITAL_BASED_OUTPATIENT_CLINIC_OR_DEPARTMENT_OTHER)
Admission: RE | Disposition: A | Discharge status: Home / Self Care | Payer: Self-pay | Source: Ambulatory Visit | Attending: Surgery

## 2016-08-28 ENCOUNTER — Encounter (HOSPITAL_BASED_OUTPATIENT_CLINIC_OR_DEPARTMENT_OTHER): Payer: Self-pay

## 2016-08-28 ENCOUNTER — Ambulatory Visit (HOSPITAL_BASED_OUTPATIENT_CLINIC_OR_DEPARTMENT_OTHER): Payer: Medicare Other | Admitting: Anesthesiology

## 2016-08-28 ENCOUNTER — Ambulatory Visit (HOSPITAL_BASED_OUTPATIENT_CLINIC_OR_DEPARTMENT_OTHER)
Admission: RE | Admit: 2016-08-28 | Discharge: 2016-08-28 | Disposition: A | Discharge status: Home / Self Care | Payer: Medicare Other | Source: Ambulatory Visit | Attending: Surgery | Admitting: Surgery

## 2016-08-28 DIAGNOSIS — K649 Unspecified hemorrhoids: Secondary | ICD-10-CM | POA: Diagnosis not present

## 2016-08-28 DIAGNOSIS — Z87891 Personal history of nicotine dependence: Secondary | ICD-10-CM | POA: Diagnosis not present

## 2016-08-28 DIAGNOSIS — Z791 Long term (current) use of non-steroidal anti-inflammatories (NSAID): Secondary | ICD-10-CM | POA: Diagnosis not present

## 2016-08-28 DIAGNOSIS — J449 Chronic obstructive pulmonary disease, unspecified: Secondary | ICD-10-CM | POA: Diagnosis not present

## 2016-08-28 DIAGNOSIS — K644 Residual hemorrhoidal skin tags: Secondary | ICD-10-CM | POA: Diagnosis not present

## 2016-08-28 DIAGNOSIS — K219 Gastro-esophageal reflux disease without esophagitis: Secondary | ICD-10-CM | POA: Insufficient documentation

## 2016-08-28 DIAGNOSIS — K640 First degree hemorrhoids: Secondary | ICD-10-CM | POA: Diagnosis not present

## 2016-08-28 HISTORY — DX: Personal history of adenomatous and serrated colon polyps: Z86.0101

## 2016-08-28 HISTORY — DX: Gastro-esophageal reflux disease without esophagitis: K21.9

## 2016-08-28 HISTORY — DX: Chronic obstructive pulmonary disease, unspecified: J44.9

## 2016-08-28 HISTORY — DX: Nocturia: R35.1

## 2016-08-28 HISTORY — DX: Personal history of colonic polyps: Z86.010

## 2016-08-28 HISTORY — DX: Presence of spectacles and contact lenses: Z97.3

## 2016-08-28 HISTORY — PX: HEMORRHOID SURGERY: SHX153

## 2016-08-28 HISTORY — DX: Unspecified osteoarthritis, unspecified site: M19.90

## 2016-08-28 HISTORY — DX: Personal history of malignant melanoma of skin: Z85.820

## 2016-08-28 HISTORY — DX: Residual hemorrhoidal skin tags: K64.4

## 2016-08-28 HISTORY — DX: Personal history of malignant neoplasm of prostate: Z85.46

## 2016-08-28 SURGERY — HEMORRHOIDECTOMY
Anesthesia: General | Site: Rectum

## 2016-08-28 MED ORDER — SUCCINYLCHOLINE 20MG/ML (10ML) SYRINGE FOR MEDFUSION PUMP - OPTIME
INTRAMUSCULAR | Status: DC | PRN
  Administered 2016-08-28: 80 mg via INTRAVENOUS

## 2016-08-28 MED ORDER — BUPIVACAINE-EPINEPHRINE (PF) 0.5% -1:200000 IJ SOLN
INTRAMUSCULAR | Status: DC | PRN
  Administered 2016-08-28: 30 mL

## 2016-08-28 MED ORDER — CHLORHEXIDINE GLUCONATE 4 % EX LIQD
60.0000 mL | Freq: Once | CUTANEOUS | Status: DC
  Filled 2016-08-28: qty 118

## 2016-08-28 MED ORDER — ONDANSETRON HCL 4 MG/2ML IJ SOLN
INTRAMUSCULAR | Status: AC
  Filled 2016-08-28: qty 2

## 2016-08-28 MED ORDER — OXYCODONE-ACETAMINOPHEN 5-325 MG PO TABS: 1.0000 | ORAL_TABLET | Freq: Four times a day (QID) | ORAL | 0 refills | Status: DC | PRN

## 2016-08-28 MED ORDER — LACTATED RINGERS IV SOLN
INTRAVENOUS | Status: DC
  Administered 2016-08-28 (×3): via INTRAVENOUS
  Filled 2016-08-28: qty 1000

## 2016-08-28 MED ORDER — FENTANYL CITRATE (PF) 100 MCG/2ML IJ SOLN
INTRAMUSCULAR | Status: DC | PRN
  Administered 2016-08-28: 25 ug via INTRAVENOUS
  Administered 2016-08-28: 12.5 ug via INTRAVENOUS
  Administered 2016-08-28: 25 ug via INTRAVENOUS
  Administered 2016-08-28 (×3): 12.5 ug via INTRAVENOUS

## 2016-08-28 MED ORDER — IPRATROPIUM-ALBUTEROL 0.5-2.5 (3) MG/3ML IN SOLN
3.0000 mL | Freq: Four times a day (QID) | RESPIRATORY_TRACT | Status: DC
  Administered 2016-08-28: 3 mL via RESPIRATORY_TRACT
  Filled 2016-08-28 (×2): qty 3

## 2016-08-28 MED ORDER — SODIUM CHLORIDE 0.9% FLUSH
3.0000 mL | INTRAVENOUS | Status: DC | PRN
  Filled 2016-08-28: qty 3

## 2016-08-28 MED ORDER — ACETAMINOPHEN 650 MG RE SUPP
650.0000 mg | RECTAL | Status: DC | PRN
  Filled 2016-08-28: qty 1

## 2016-08-28 MED ORDER — BUPIVACAINE LIPOSOME 1.3 % IJ SUSP
INTRAMUSCULAR | Status: DC | PRN
  Administered 2016-08-28: 20 mL

## 2016-08-28 MED ORDER — PROPOFOL 500 MG/50ML IV EMUL
INTRAVENOUS | Status: AC
  Filled 2016-08-28: qty 50

## 2016-08-28 MED ORDER — SODIUM CHLORIDE 0.9% FLUSH
3.0000 mL | Freq: Two times a day (BID) | INTRAVENOUS | Status: DC
  Filled 2016-08-28: qty 3

## 2016-08-28 MED ORDER — FENTANYL CITRATE (PF) 100 MCG/2ML IJ SOLN
25.0000 ug | INTRAMUSCULAR | Status: DC | PRN
  Filled 2016-08-28: qty 1

## 2016-08-28 MED ORDER — DIAZEPAM 2 MG PO TABS: 2.0000 mg | ORAL_TABLET | Freq: Four times a day (QID) | ORAL | 0 refills | Status: DC | PRN

## 2016-08-28 MED ORDER — SODIUM CHLORIDE 0.9 % IN NEBU
INHALATION_SOLUTION | RESPIRATORY_TRACT | Status: AC
  Filled 2016-08-28: qty 3

## 2016-08-28 MED ORDER — MIDAZOLAM HCL 2 MG/2ML IJ SOLN
INTRAMUSCULAR | Status: AC
  Filled 2016-08-28: qty 2

## 2016-08-28 MED ORDER — LIDOCAINE 2% (20 MG/ML) 5 ML SYRINGE
INTRAMUSCULAR | Status: AC
  Filled 2016-08-28: qty 5

## 2016-08-28 MED ORDER — DEXAMETHASONE SODIUM PHOSPHATE 4 MG/ML IJ SOLN
INTRAMUSCULAR | Status: DC | PRN
  Administered 2016-08-28: 10 mg via INTRAVENOUS

## 2016-08-28 MED ORDER — CEFAZOLIN SODIUM-DEXTROSE 2-4 GM/100ML-% IV SOLN
2.0000 g | INTRAVENOUS | Status: AC
  Administered 2016-08-28: 2 g via INTRAVENOUS
  Filled 2016-08-28: qty 100

## 2016-08-28 MED ORDER — KETOROLAC TROMETHAMINE 30 MG/ML IJ SOLN
INTRAMUSCULAR | Status: DC | PRN
  Administered 2016-08-28: 30 mg via INTRAVENOUS

## 2016-08-28 MED ORDER — ONDANSETRON HCL 4 MG/2ML IJ SOLN
INTRAMUSCULAR | Status: DC | PRN
  Administered 2016-08-28: 4 mg via INTRAVENOUS

## 2016-08-28 MED ORDER — PROPOFOL 10 MG/ML IV BOLUS
INTRAVENOUS | Status: AC
  Filled 2016-08-28: qty 40

## 2016-08-28 MED ORDER — DEXAMETHASONE SODIUM PHOSPHATE 10 MG/ML IJ SOLN
INTRAMUSCULAR | Status: AC
  Filled 2016-08-28: qty 1

## 2016-08-28 MED ORDER — FENTANYL CITRATE (PF) 100 MCG/2ML IJ SOLN
INTRAMUSCULAR | Status: AC
  Filled 2016-08-28: qty 2

## 2016-08-28 MED ORDER — KETOROLAC TROMETHAMINE 30 MG/ML IJ SOLN
INTRAMUSCULAR | Status: AC
  Filled 2016-08-28: qty 1

## 2016-08-28 MED ORDER — FLEET ENEMA 7-19 GM/118ML RE ENEM
1.0000 | ENEMA | Freq: Once | RECTAL | Status: AC
  Administered 2016-08-28: 1 via RECTAL
  Filled 2016-08-28: qty 1

## 2016-08-28 MED ORDER — ACETAMINOPHEN 325 MG PO TABS
650.0000 mg | ORAL_TABLET | ORAL | Status: DC | PRN
  Filled 2016-08-28: qty 2

## 2016-08-28 MED ORDER — SUCCINYLCHOLINE CHLORIDE 200 MG/10ML IV SOSY
PREFILLED_SYRINGE | INTRAVENOUS | Status: AC
  Filled 2016-08-28: qty 10

## 2016-08-28 MED ORDER — PROPOFOL 500 MG/50ML IV EMUL
INTRAVENOUS | Status: DC | PRN
  Administered 2016-08-28: 25 ug/kg/min via INTRAVENOUS

## 2016-08-28 MED ORDER — SODIUM CHLORIDE 0.9 % IV SOLN
250.0000 mL | INTRAVENOUS | Status: DC | PRN
  Filled 2016-08-28: qty 250

## 2016-08-28 MED ORDER — OXYCODONE HCL 5 MG PO TABS
5.0000 mg | ORAL_TABLET | ORAL | Status: DC | PRN
  Filled 2016-08-28: qty 2

## 2016-08-28 MED ORDER — MIDAZOLAM HCL 5 MG/5ML IJ SOLN
INTRAMUSCULAR | Status: DC | PRN
  Administered 2016-08-28: 2 mg via INTRAVENOUS

## 2016-08-28 MED ORDER — PROPOFOL 10 MG/ML IV BOLUS
INTRAVENOUS | Status: DC | PRN
  Administered 2016-08-28: 100 mg via INTRAVENOUS
  Administered 2016-08-28 (×4): 10 mg via INTRAVENOUS

## 2016-08-28 MED ORDER — PROMETHAZINE HCL 25 MG/ML IJ SOLN
6.2500 mg | INTRAMUSCULAR | Status: DC | PRN
  Filled 2016-08-28: qty 1

## 2016-08-28 MED ORDER — HYDROMORPHONE HCL 1 MG/ML IJ SOLN
0.2500 mg | INTRAMUSCULAR | Status: DC | PRN
  Filled 2016-08-28: qty 0.5

## 2016-08-28 MED ORDER — CEFAZOLIN SODIUM-DEXTROSE 2-4 GM/100ML-% IV SOLN
INTRAVENOUS | Status: AC
  Filled 2016-08-28: qty 100

## 2016-08-28 SURGICAL SUPPLY — 43 items
BLADE HEX COATED 2.75 (ELECTRODE) ×2 IMPLANT
BLADE SURG 11 STRL SS (BLADE) IMPLANT
BLADE SURG 15 STRL LF DISP TIS (BLADE) ×1 IMPLANT
BLADE SURG 15 STRL SS (BLADE) ×2
BRIEF STRETCH FOR OB PAD LRG (UNDERPADS AND DIAPERS) ×2 IMPLANT
CANISTER SUCT 1200ML W/VALVE (MISCELLANEOUS) ×2 IMPLANT
COVER BACK TABLE 60X90IN (DRAPES) ×1 IMPLANT
COVER MAYO STAND STRL (DRAPES) ×2 IMPLANT
DECANTER SPIKE VIAL GLASS SM (MISCELLANEOUS) ×1 IMPLANT
DRAPE LAPAROTOMY 100X72 PEDS (DRAPES) IMPLANT
DRAPE LG THREE QUARTER DISP (DRAPES) IMPLANT
DRAPE UNDERBUTTOCKS STRL (DRAPE) IMPLANT
DRAPE UTILITY XL STRL (DRAPES) ×2 IMPLANT
DRSG PAD ABDOMINAL 8X10 ST (GAUZE/BANDAGES/DRESSINGS) ×1 IMPLANT
ELECT REM PT RETURN 9FT ADLT (ELECTROSURGICAL) ×2
ELECTRODE REM PT RTRN 9FT ADLT (ELECTROSURGICAL) ×1 IMPLANT
GAUZE SPONGE 4X4 12PLY STRL LF (GAUZE/BANDAGES/DRESSINGS) ×1 IMPLANT
GAUZE SPONGE 4X4 16PLY XRAY LF (GAUZE/BANDAGES/DRESSINGS) IMPLANT
GLOVE BIO SURGEON STRL SZ 6 (GLOVE) ×2 IMPLANT
GLOVE BIOGEL PI IND STRL 6.5 (GLOVE) ×1 IMPLANT
GLOVE BIOGEL PI INDICATOR 6.5 (GLOVE) ×1
GOWN STRL REUS W/ TWL LRG LVL3 (GOWN DISPOSABLE) ×2 IMPLANT
GOWN STRL REUS W/TWL LRG LVL3 (GOWN DISPOSABLE) ×4
LEGGING LITHOTOMY PAIR STRL (DRAPES) IMPLANT
NDL HYPO 25X1 1.5 SAFETY (NEEDLE) ×1 IMPLANT
NEEDLE HYPO 25X1 1.5 SAFETY (NEEDLE) ×2 IMPLANT
NS IRRIG 1000ML POUR BTL (IV SOLUTION) ×2 IMPLANT
PACK BASIN DAY SURGERY FS (CUSTOM PROCEDURE TRAY) ×2 IMPLANT
PAD ABD 8X10 STRL (GAUZE/BANDAGES/DRESSINGS) ×2 IMPLANT
PENCIL BUTTON HOLSTER BLD 10FT (ELECTRODE) ×2 IMPLANT
SHEARS HARMONIC 9CM CVD (BLADE) ×3 IMPLANT
SLEEVE SCD COMPRESS KNEE MED (MISCELLANEOUS) ×2 IMPLANT
SPONGE HEMORRHOID 8X3CM (HEMOSTASIS) ×2 IMPLANT
SPONGE LAP 4X18 X RAY DECT (DISPOSABLE) ×2 IMPLANT
SPONGE SURGIFOAM ABS GEL 100 (HEMOSTASIS) IMPLANT
SURGILUBE 2OZ TUBE FLIPTOP (MISCELLANEOUS) ×2 IMPLANT
SUT CHROMIC 3 0 SH 27 (SUTURE) ×1 IMPLANT
SYR CONTROL 10ML LL (SYRINGE) ×2 IMPLANT
TOWEL OR 17X24 6PK STRL BLUE (TOWEL DISPOSABLE) ×2 IMPLANT
TRAY DSU PREP LF (CUSTOM PROCEDURE TRAY) ×2 IMPLANT
TUBE CONNECTING 12X1/4 (SUCTIONS) ×2 IMPLANT
UNDERPAD 30X30 (UNDERPADS AND DIAPERS) ×2 IMPLANT
YANKAUER SUCT BULB TIP NO VENT (SUCTIONS) ×2 IMPLANT

## 2016-08-28 NOTE — Discharge Instructions (Signed)
Information for Discharge Teaching: EXPAREL (bupivacaine liposome injectable suspension)   Your surgeon gave you EXPAREL(bupivacaine) in your surgical incision to help control your pain after surgery.   EXPAREL is a local anesthetic that provides pain relief by numbing the tissue around the surgical site.  EXPAREL is designed to release pain medication over time and can control pain for up to 72 hours.  Depending on how you respond to EXPAREL, you may require less pain medication during your recovery.  Possible side effects:  Temporary loss of sensation or ability to move in the area where bupivacaine was injected.  Nausea, vomiting, constipation  Rarely, numbness and tingling in your mouth or lips, lightheadedness, or anxiety may occur.  Call your doctor right away if you think you may be experiencing any of these sensations, or if you have other questions regarding possible side effects.  Follow all other discharge instructions given to you by your surgeon or nurse. Eat a healthy diet and drink plenty of water or other fluids.  If you return to the hospital for any reason within 96 hours following the administration of EXPAREL, please inform your health care providers. Post Anesthesia Home Care Instructions  Activity: Get plenty of rest for the remainder of the day. A responsible individual must stay with you for 24 hours following the procedure.  For the next 24 hours, DO NOT: -Drive a car -Paediatric nurse -Drink alcoholic beverages -Take any medication unless instructed by your physician -Make any legal decisions or sign important papers.  Meals: Start with liquid foods such as gelatin or soup. Progress to regular foods as tolerated. Avoid greasy, spicy, heavy foods. If nausea and/or vomiting occur, drink only clear liquids until the nausea and/or vomiting subsides. Call your physician if vomiting continues.  Special Instructions/Symptoms: Your throat may feel dry or sore  from the anesthesia or the breathing tube placed in your throat during surgery. If this causes discomfort, gargle with warm salt water. The discomfort should disappear within 24 hours.  If you had a scopolamine patch placed behind your ear for the management of post- operative nausea and/or vomiting:  1. The medication in the patch is effective for 72 hours, after which it should be removed.  Wrap patch in a tissue and discard in the trash. Wash hands thoroughly with soap and water. 2. You may remove the patch earlier than 72 hours if you experience unpleasant side effects which may include dry mouth, dizziness or visual disturbances. 3. Avoid touching the patch. Wash your hands with soap and water after contact with the patch.   ANORECTAL SURGERY: POST OP INSTRUCTIONS 1. Take your usually prescribed home medications unless otherwise directed. 2. DIET: During the first few hours after surgery sip on some liquids until you are able to urinate.  It is normal to not urinate for several hours after this surgery.  If you feel uncomfortable, please contact the office for instructions.  After you are able to urinate,you may eat, if you feel like it.  Follow a light bland diet the first 24 hours after arrival home, such as soup, liquids, crackers, etc.  Be sure to include lots of fluids daily (6-8 glasses).  Avoid fast food or heavy meals, as your are more likely to get nauseated.  Eat a low fat diet the next few days after surgery.  Limit caffeine intake to 1-2 servings a day. 3. PAIN CONTROL: a. Pain is best controlled by a usual combination of several different methods TOGETHER: i. Muscle  relaxation 1.  Soak in a warm bath (or Sitz bath) three times a day and after bowel movements.  Continue to do this until all pain is resolved. 2. Take the muscle relaxer (Valium) every 6 hours for the first 2 days after surgery  ii. Over the counter pain medication iii. Prescription pain medication b. Most patients  will experience some swelling and discomfort in the anus/rectal area and incisions.  Heat such as warm towels, sitz baths, warm baths, etc to help relax tight/sore spots and speed recovery.  Some people prefer to use ice, especially in the first couple days after surgery, as it may decrease the pain and swelling, or alternate between ice & heat.  Experiment to what works for you.  Swelling and bruising can take several weeks to resolve.  Pain can take even longer to completely resolve. c. It is helpful to take an over-the-counter pain medication regularly for the first few weeks.  Choose one of the following that works best for you: i. Naproxen (Aleve, etc)  Two 220mg  tabs twice a day ii. Ibuprofen (Advil, etc) Three 200mg  tabs four times a day (every meal & bedtime) d. A  prescription for pain medication (such as percocet, oxycodone, hydrocodone, etc) should be given to you upon discharge.  Take your pain medication as prescribed.  i. If you are having problems/concerns with the prescription medicine (does not control pain, nausea, vomiting, rash, itching, etc), please call us [phone] to see if we need to switch you to a different pain medicine that will work better for you and/or control your side effect better. ii. If you need a refill on your pain medication, please contact your pharmacy.  They will contact our office to request authorization. Prescriptions will not be filled after 5 pm or on week-ends. 4. KEEP YOUR BOWELS REGULAR and AVOID CONSTIPATION a. The goal is one to two soft bowel movements a day.  You should at least have a bowel movement every other day. b. Avoid getting constipated.  Between the surgery and the pain medications, it is common to experience some constipation. This can be very painful after rectal surgery.  Increasing fluid intake and taking a fiber supplement (such as Metamucil, Citrucel, FiberCon, etc) 1-2 times a day regularly will usually help prevent this problem  from occurring.  A stool softener like colace is also recommended.  This can be purchased over the counter at your pharmacy.  You can take it up to 3 times a day.  If you do not have a bowel movement after 24 hrs since your surgery, take one does of milk of magnesia.  If you still haven't had a bowel movement 8-12 hours after that dose, take another dose.  If you don't have a bowel movement 48 hrs after surgery, purchase a Fleets enema from the drug store and administer gently per package instructions.  If you still are having trouble with your bowel movements after that, please call the office for further instructions. c. If you develop diarrhea or have many loose bowel movements, simplify your diet to bland foods & liquids for a few days.  Stop any stool softeners and decrease your fiber supplement.  Switching to mild anti-diarrheal medications (Kayopectate, Pepto Bismol) can help.  If this worsens or does not improve, please call us.  5. Wound Care a. Remove your bandages before your first bowel movement or 8 hours after surgery.     b. Remove any wound packing material at this time  as well.  You do not need to repack the wound unless instructed otherwise.  Wear an absorbent pad or soft cotton gauze in your underwear to catch any drainage and help keep the area clean. You should change this every 2-3 hours while awake. c. Keep the area clean and dry.  Bathe / shower every day, especially after bowel movements.  Keep the area clean by showering / bathing over the incision / wound.   It is okay to soak an open wound to help wash it.  Wet wipes or showers / gentle washing after bowel movements is often less traumatic than regular toilet paper. d. Dennis Bast may have some styrofoam-like soft packing in the rectum which will come out with the first bowel movement.  e. You will often notice bleeding with bowel movements.  This should slow down by the end of the first week of surgery f. Expect some drainage.  This  should slow down, too, by the end of the first week of surgery.  Wear an absorbent pad or soft cotton gauze in your underwear until the drainage stops. g. Do Not sit on a rubber or pillow ring.  This can make you symptoms worse.  You may sit on a soft pillow if needed.  6. ACTIVITIES as tolerated:   a. You may resume regular (light) daily activities beginning the next day--such as daily self-care, walking, climbing stairs--gradually increasing activities as tolerated.  If you can walk 30 minutes without difficulty, it is safe to try more intense activity such as jogging, treadmill, bicycling, low-impact aerobics, swimming, etc. b. Save the most intensive and strenuous activity for last such as sit-ups, heavy lifting, contact sports, etc  Refrain from any heavy lifting or straining until you are off narcotics for pain control.   c. You may drive when you are no longer taking prescription pain medication, you can comfortably sit for long periods of time, and you can safely maneuver your car and apply brakes. d. Dennis Bast may have sexual intercourse when it is comfortable.  7. FOLLOW UP in our office a. Please call [facility] at [phone] to set up an appointment to see your surgeon in the office for a follow-up appointment approximately 3-4 weeks after your surgery. b. Make sure that you call for this appointment the day you arrive home to insure a convenient appointment time. 10. IF YOU HAVE DISABILITY OR FAMILY LEAVE FORMS, BRING THEM TO THE OFFICE FOR PROCESSING.  DO NOT GIVE THEM TO YOUR DOCTOR.     WHEN TO CALL us [phone]: 1. Poor pain control 2. Reactions / problems with new medications (rash/itching, nausea, etc)  3. Fever over 101.5 F (38.5 C) 4. Inability to urinate 5. Nausea and/or vomiting 6. Worsening swelling or bruising 7. Continued bleeding from incision. 8. Increased pain, redness, or drainage from the incision  The clinic staff is available to answer your questions during  regular business hours (8:30am-5pm).  Please dont hesitate to call and ask to speak to one of our nurses for clinical concerns.   A surgeon from [facility] is always on call at the hospitals   If you have a medical emergency, go to the nearest emergency room or call 911.    [facility], [address], [address], [place], [place]  [address] ? MAIN: [phone] ? TOLL FREE: [phone] ? FAX [phone] [email or web address]

## 2016-08-28 NOTE — Anesthesia Procedure Notes (Signed)
Procedure Name: LMA Insertion Date/Time: 08/28/2016 8:27 AM Performed by: Justice Rocher Oxygen Delivery Method: Circle system utilized Preoxygenation: Pre-oxygenation with 100% oxygen LMA Size: 4.0 Number of attempts: 1 Placement Confirmation: breath sounds checked- equal and bilateral and positive ETCO2 Tube secured with: Tape

## 2016-08-28 NOTE — H&P (Signed)
Essie Christine JR DOB: Nov 14, 1950 Married / Language: English / Race: White Male  History of Present Illness  Patient words: This is a very pleasant 66 year old gentleman who is referred by Dr. Watt Climes for symptomatic hemorrhoids. He has had hemorrhoids for many years with intermittent flares. He has had a couple of thrombosed external hemorrhoids lanced in the past. He does get regular colonoscopies, last in 2016. In December of this past year he had a normal virus infection with severe diarrhea, and since then his hemorrhoids have been persistently more symptomatic. He described pain, itching and bleeding. He reports his bowel function is regular, stating he has soft easy bowel movements without pushing or straining. He spent about 10 minutes on the toilet. He has a pretty elaborate. All hygiene routine with wet paper followed by baby wipes followed by Tucks wipes and then Preparation H or lidocaine topical. He also does sits baths as needed. He is becoming frustrated by hemorrhoids as he seems to get them under control for a little while and they re present themselves. He would like to discuss surgical options. 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 He is going on a vacation to [place] for a [employer] in the fall. He has a history of radical prostatectomy was also arthroscopic surgeries.    Past Surgical History  Colon Polyp Removal - Colonoscopy Knee Surgery Left. Prostate Surgery - Removal  Diagnostic Studies History  Colonoscopy 1-5 years ago  Allergies  No Known Allergies 07/17/2016  Medication History ( Anusol-HC (2.5% Cream, Rectal) Active. Naproxen Sodium (220MG  Tablet, Oral) Active. PriLOSEC (10MG  Packet, Oral) Active. Medications Reconciled  Social History  Alcohol use Moderate alcohol use. Caffeine use Coffee. No drug use Tobacco use Former smoker.  Family  History  Arthritis Father. Colon Cancer Mother. Heart Disease Father. Ischemic Bowel Disease Mother.  Other Problems  Back Pain Chronic Obstructive Lung Disease Diverticulosis Gastroesophageal Reflux Disease Hemorrhoids Melanoma Prostate Cancer     Review of Systems  General Not Present- Appetite Loss, Chills, Fatigue, Fever, Night Sweats, Weight Gain and Weight Loss. Skin Not Present- Change in Wart/Mole, Dryness, Hives, Jaundice, New Lesions, Non-Healing Wounds, Rash and Ulcer. HEENT Present- Wears glasses/contact lenses. Not Present- Earache, Hearing Loss, Hoarseness, Nose Bleed, Oral Ulcers, Ringing in the Ears, Seasonal Allergies, Sinus Pain, Sore Throat, Visual Disturbances and Yellow Eyes. Respiratory Not Present- Bloody sputum, Chronic Cough, Difficulty Breathing, Snoring and Wheezing. Breast Not Present- Breast Mass, Breast Pain, Nipple Discharge and Skin Changes. Cardiovascular Present- Leg Cramps. Not Present- Chest Pain, Difficulty Breathing Lying Down, Palpitations, Rapid Heart Rate, Shortness of Breath and Swelling of Extremities. Gastrointestinal Present- Hemorrhoids, Indigestion and Rectal Pain. Not Present- Abdominal Pain, Bloating, Bloody Stool, Change in Bowel Habits, Chronic diarrhea, Constipation, Difficulty Swallowing, Excessive gas, Gets full quickly at meals, Nausea and Vomiting. Male Genitourinary Not Present- Blood in Urine, Change in Urinary Stream, Frequency, Impotence, Nocturia, Painful Urination, Urgency and Urine Leakage. Musculoskeletal Present- Joint Pain. Not Present- Back Pain, Joint Stiffness, Muscle Pain, Muscle Weakness and Swelling of Extremities. Neurological Not Present- Decreased Memory, Fainting, Headaches, Numbness, Seizures, Tingling, Tremor, Trouble walking and Weakness. Psychiatric Not Present- Anxiety, Bipolar, Change in Sleep Pattern, Depression, Fearful and Frequent crying. Endocrine Not Present- Cold Intolerance,  Excessive Hunger, Hair Changes, Heat Intolerance, Hot flashes and New Diabetes. Hematology Not Present- Blood Thinners, Easy Bruising, Excessive bleeding, Gland problems, HIV and Persistent Infections. All other systems negative  Vitals:  08/28/16 0603  BP: 139/86  Pulse: 71  Resp: 18  Temp: 98 F (36.7 C)  SpO2: 97%     Physical Exam   General Note: Alert and well-appearing  Integumentary Note: Skin is warm and dry  Head and Neck Note: No mass or thyromegaly  Eye Note: Anicteric. Pupils equal round reactive.  ENMT Note: Moist mucous membranes normal dentition  Chest and Lung Exam Note: Unlabored respirations, no tactile fremitus  Cardiovascular Note: Regular rate and rhythm. No pedal edema  Abdomen Note: Soft, nontender nondistended. No palpable mass or organomegaly  Rectal Note: He has decompressed external hemorrhoids with mild overlying inflammation of the perianal skin. On anoscopic exam he has grade 1 internal disease throughout. No fissures or other lesions. No palpable mass  Neurologic Note: Grossly intact, normal gait  Neuropsychiatric Note: Normal mood and affect, appropriate insight  Musculoskeletal Note: Strength symmetrical throughout, no deformity    Assessment & Plan   HEMORRHOIDS (K64.9) Story: He desires excision. We discussed the multiple options including imaging of his bowel regimen. I counseled him to be less aggressive with his perianal hygiene routine and explained the rationale for that. We discussed how to properly do sitz baths and possibly adding in a fiber supplement or MiraLAX to help with his bowel movements. Also discussed spending less than 10 minutes on the toilet. He will adapt some of these behaviors but he does desire surgical excision. I discussed with him the nature of the surgery including the possible publications of bleeding, pelvic sepsis, urinary retention, incontinence or sphincter  injury. He quit understanding. He desires to proceed. I described to him that I would excise the left lateral and right posterior complexes does seem to be the most irritated at this time it would leave the right anterior. We will get him certain surgery scheduled in the next few months.

## 2016-08-28 NOTE — Op Note (Signed)
Operative Note  Daniel Barber  [id number]  [id number]  08/28/2016   Surgeon: Victorino Sparrow ConnorMD  Assistant: OR staff  Procedure performed: right posterior column hemorrhoidectomy  Preop diagnosis: symptomatic hemorrhoids.  Post-op diagnosis/intraop findings: circumferential hemorrhoid disease; the right anterior and posterior were more prominent than the left at this time, no ulceration or irritation present.   Specimens: right posterior hemorrhoidectomy Retained items: gelfoam packing in the rectum EBL: minimal cc Complications: none  Description of procedure: After obtaining informed consent the patient was taken to the operating room and placed prone on operating room table with all pressure points appropriately padded. SCDs were in place and preoperative antibiotics had been administered. MAC was initiated and a formal timeout was performed. The perineum was prepped and draped in usual sterile fashion using Betadine after apposition of tape to gently spread the buttocks apart. Beginning with the digital exam. There are no appreciable masses although chronically engorged hemorrhoids with a rubbery texture were present more prominent on the right anterior and posterior field done on the left. An anal block was performed using Exparel diluted with half percent lidocaine with epinephrine and then the [employer] retractor was introduced for general inspection which revealed the known hemorrhoidal disease circumferentially worse on the right than the left but no other abnormalities. The right posterior column was the most prominent at this time. The external and internal component were grasped and lifted and anoderm was incised where the external hemorrhoid shouldered. Gentle blunt dissection was then used to isolate the hemorrhoidal pedicle  from the sphincter complex. Once the sphincter abdomen visualized and dissected away from the hemorrhoid complex, the Harmonic scalpel was used to  divide the overlying mucosa following up to the radical which was also transected with the Harmonic scalpel.  At this point the patient began to desaturate. A lap pad was placed in the rectum and the patient was flipped into the supine position so that he can be intubated. Once this was complete and the saturations returned to normal, the patient was returned to the prone jackknife position for the completion of the procedure. The excision site was inspected. Some smaller dilated veins were dissected away from the overlying anoderm and excised with the Harmonic scalpel. Given the really minimal appearance of the left side in the moderately dilated right anterior column which was very close to the field of excision for the posterior column, I elected to stop at 1. Hemostasis was ensured within the field and then the mucosa was reapproximated with a running 3-0 chromic which was carried out onto the anoderm. Gelfoam packing was inserted into the rectum. The patient was then returned to the supine position, awakened and extubated, and then taken to PACU in stable condition.   All counts were correct at the completion of the case.

## 2016-08-28 NOTE — Transfer of Care (Signed)
Immediate Anesthesia Transfer of Care Note  Patient: Daniel Barber  Procedure(s) Performed: Procedure(s) (LRB): HEMORRHOIDECTOMY (N/A)  Patient Location: PACU  Anesthesia Type: General  Level of Consciousness: awake, sedated, patient cooperative and responds to stimulation  Airway & Oxygen Therapy: Patient Spontanous Breathing and Patient connected to face mask oxygen  Post-op Assessment: Report given to PACU RN, Post -op Vital signs reviewed and stable and Patient moving all extremities  Post vital signs: Reviewed and stable  Complications: No apparent anesthesia complications

## 2016-08-28 NOTE — Anesthesia Procedure Notes (Signed)
Procedure Name: MAC Date/Time: 08/28/2016 7:38 AM Performed by: Justice Rocher Pre-anesthesia Checklist: Patient identified, Timeout performed, Emergency Drugs available, Suction available and Patient being monitored Patient Re-evaluated:Patient Re-evaluated prior to induction Oxygen Delivery Method: Simple face mask Preoxygenation: Pre-oxygenation with 100% oxygen Induction Type: IV induction Placement Confirmation: positive ETCO2 and breath sounds checked- equal and bilateral

## 2016-08-28 NOTE — Anesthesia Procedure Notes (Signed)
Procedure Name: Intubation Date/Time: 08/28/2016 8:33 AM Performed by: Justice Rocher Pre-anesthesia Checklist: Patient identified, Emergency Drugs available, Suction available and Patient being monitored Patient Re-evaluated:Patient Re-evaluated prior to induction Oxygen Delivery Method: Circle system utilized Preoxygenation: Pre-oxygenation with 100% oxygen Induction Type: IV induction Ventilation: Mask ventilation without difficulty Laryngoscope Size: Mac and 4 Grade View: Grade III Tube type: Oral Tube size: 8.0 mm Number of attempts: 1 Airway Equipment and Method: Stylet and Oral airway Placement Confirmation: ETT inserted through vocal cords under direct vision,  positive ETCO2 and breath sounds checked- equal and bilateral Secured at: 23 cm Tube secured with: Tape Dental Injury: Teeth and Oropharynx as per pre-operative assessment

## 2016-08-28 NOTE — Anesthesia Postprocedure Evaluation (Signed)
Anesthesia Post Note  Patient: Daniel Barber  Procedure(s) Performed: Procedure(s) (LRB): HEMORRHOIDECTOMY (N/A)     Patient location during evaluation: PACU Anesthesia Type: General Level of consciousness: awake and alert Pain management: pain level controlled Vital Signs Assessment: post-procedure vital signs reviewed and stable Respiratory status: spontaneous breathing, nonlabored ventilation, respiratory function stable and patient connected to nasal cannula oxygen Cardiovascular status: blood pressure returned to baseline and stable Postop Assessment: no signs of nausea or vomiting Anesthetic complications: no    Last Vitals:  Vitals:   08/28/16 0945 08/28/16 1000  BP: 121/74 110/78  Pulse: 65 65  Resp: 18 17  Temp:    SpO2: 99% 94%    Last Pain:  Vitals:   08/28/16 0615  TempSrc:   PainSc: 3                  Tiajuana Amass

## 2016-08-31 ENCOUNTER — Encounter (HOSPITAL_BASED_OUTPATIENT_CLINIC_OR_DEPARTMENT_OTHER): Payer: Self-pay | Admitting: Surgery

## 2016-10-28 ENCOUNTER — Encounter: Payer: Self-pay | Admitting: Family

## 2016-10-29 ENCOUNTER — Ambulatory Visit: Payer: Medicare Other

## 2016-10-30 ENCOUNTER — Ambulatory Visit (INDEPENDENT_AMBULATORY_CARE_PROVIDER_SITE_OTHER): Payer: Medicare Other

## 2016-10-30 DIAGNOSIS — Z23 Encounter for immunization: Secondary | ICD-10-CM

## 2016-12-14 ENCOUNTER — Encounter: Payer: Self-pay | Admitting: Nurse Practitioner

## 2016-12-14 ENCOUNTER — Ambulatory Visit (INDEPENDENT_AMBULATORY_CARE_PROVIDER_SITE_OTHER): Payer: Medicare Other | Admitting: Nurse Practitioner

## 2016-12-14 VITALS — BP 124/82 | HR 62 | Temp 98.0°F | Resp 16 | Ht 72.0 in | Wt 253.0 lb

## 2016-12-14 DIAGNOSIS — K219 Gastro-esophageal reflux disease without esophagitis: Secondary | ICD-10-CM | POA: Diagnosis not present

## 2016-12-14 DIAGNOSIS — M1990 Unspecified osteoarthritis, unspecified site: Secondary | ICD-10-CM | POA: Insufficient documentation

## 2016-12-14 NOTE — Patient Instructions (Addendum)
If you have medicare related insurance (such as traditional Medicare, [employer], [employer], or similar), Please make an appointment at the scheduling desk with Sharee Pimple, the [employer], for your Wellness visit in this office, which is a benefit with your insurance.  Ill see you back in 1 year, or sooner if you need me.  It was nice to meet you. Thanks for letting me take care of you today :)

## 2016-12-14 NOTE — Assessment & Plan Note (Signed)
Maintained on prilosec prn with adequate symptom control. We discussed diet, exercise and weight loss in the management of GERD symptoms. He is motivated to increase activity and lose weigh now that he has recovered from a recent hemorrhoidectomy.

## 2016-12-14 NOTE — Progress Notes (Signed)
Subjective:    Patient ID: Daniel Barber, male    DOB: 04/21/1950, 66 y.o.   MRN: [id number]  HPI Daniel Barber is a 66yo male who presents today to establish care. He is transferring to me from another provider in the same clinic.  Acid reflux- maintained on prilosec PRN, takes about twice per week. He tries to avoid trigger foods- spicy, greasy meats, tomatoes and tomato based sauces. He Is able to take prilosex with relief when he does experience symptoms.  Denies hoarseness, dysphagia, regurgitation, cough, chest pain, shortness of breath.  Review of Systems  See HPI  Past Medical History:  Diagnosis Date  . Arthritis   . COPD, mild ([place])   . External hemorrhoids    symptomatic  . GERD (gastroesophageal reflux disease)   . History of adenomatous polyp of colon followed by dr Watt Climes   tubular adenoma's 1995;  2000;  2006;  2016  . History of malignant melanoma of skin 2017   excision posterior neck (localized)  . History of prostate cancer followed by pcp   1999--  radical prostatectomy/  per pt last PSA less than 0.1 on May 2017  . Nocturia   . Wears glasses      Social History   Socioeconomic History  . Marital status: Married    Spouse name: Not on file  . Number of children: 2  . Years of education: 24  . Highest education level: Not on file  Social Needs  . Financial resource strain: Not on file  . Food insecurity - worry: Not on file  . Food insecurity - inability: Not on file  . Transportation needs - medical: Not on file  . Transportation needs - non-medical: Not on file  Occupational History  . Occupation: Financial risk analyst  Tobacco Use  . Smoking status: Current Some Day Smoker    Years: 15.00    Types: Cigarettes, Cigars    Last attempt to quit: 08/18/2006    Years since quitting: 10.3  . Smokeless tobacco: Never Used  . Tobacco comment: quit cigarettes 2008, currently cigar average 1 every 2 months  Substance and Sexual Activity  . Alcohol  use: Yes    Comment: 2 DRINKS DAILY  . Drug use: No  . Sexual activity: Not on file  Other Topics Concern  . Not on file  Social History Narrative   Fun: Fish, shooting, antique cars   Denies religious beliefs effecting health care.     Past Surgical History:  Procedure Laterality Date  . COLONOSCOPY  last one 07-27-2014  dr Watt Climes  . ELBOW SURGERY Right 1989  . HEMORRHOID SURGERY N/A 08/28/2016   Procedure: HEMORRHOIDECTOMY;  Surgeon: Clovis Riley, MD;  Location: [facility];  Service: General;  Laterality: N/A;  . KNEE ARTHROSCOPY Left 2002  approx.  Marland Kitchen MELANOMA EXCISION  2017   posterior neck (localized)  . PROSTATECTOMY  1999   dr Risa Grill    Family History  Problem Relation Age of Onset  . Colon cancer Mother   . Heart attack Father   . Healthy Maternal Grandmother   . Healthy Maternal Grandfather   . Healthy Paternal Grandmother   . Healthy Paternal Grandfather     No Known Allergies  Current Outpatient Medications on File Prior to Visit  Medication Sig Dispense Refill  . diazepam (VALIUM) 2 MG tablet Take 1 tablet (2 mg total) by mouth every 6 (six) hours as needed for muscle spasms. 30 tablet 0  .  naproxen sodium (ANAPROX) 220 MG tablet Take 220 mg by mouth 2 (two) times daily with a meal.    . omeprazole (PRILOSEC) 10 MG capsule Take 10 mg by mouth as needed.     Marland Kitchen oxyCODONE-acetaminophen (PERCOCET/ROXICET) 5-325 MG tablet Take 1 tablet by mouth every 6 (six) hours as needed for severe pain. 30 tablet 0   No current facility-administered medications on file prior to visit.     BP 124/82 (BP Location: Left Arm, Patient Position: Sitting, Cuff Size: Large)   Pulse 62   Temp 98 F (36.7 C) (Oral)   Resp 16   Ht 6' (1.829 m)   Wt 253 lb (114.8 kg)   SpO2 96%   BMI 34.31 kg/m       Objective:   Physical Exam  Constitutional: He is oriented to person, place, and time. He appears well-developed and well-nourished. No distress.  HENT:    Head: Normocephalic and atraumatic.  Cardiovascular: Normal rate, regular rhythm, normal heart sounds and intact distal pulses.  Pulmonary/Chest: Effort normal and breath sounds normal.  Abdominal: Soft. Bowel sounds are normal. He exhibits no distension. There is no tenderness.  Rotund.  Neurological: He is alert and oriented to person, place, and time. Coordination normal.  Skin: Skin is warm and dry.  Psychiatric: He has a normal mood and affect. Judgment and thought content normal.      Assessment & Plan:  Wellness with Sharee Pimple encouraged. RTC in 1 year or sooner if needed

## 2017-02-22 DIAGNOSIS — L570 Actinic keratosis: Secondary | ICD-10-CM | POA: Diagnosis not present

## 2017-02-22 DIAGNOSIS — L814 Other melanin hyperpigmentation: Secondary | ICD-10-CM | POA: Diagnosis not present

## 2017-02-22 DIAGNOSIS — L821 Other seborrheic keratosis: Secondary | ICD-10-CM | POA: Diagnosis not present

## 2017-02-22 DIAGNOSIS — Z85820 Personal history of malignant melanoma of skin: Secondary | ICD-10-CM | POA: Diagnosis not present

## 2017-05-17 ENCOUNTER — Ambulatory Visit (INDEPENDENT_AMBULATORY_CARE_PROVIDER_SITE_OTHER)
Admission: RE | Admit: 2017-05-17 | Discharge: 2017-05-17 | Disposition: A | Discharge status: Home / Self Care | Payer: Medicare Other | Source: Ambulatory Visit | Attending: Nurse Practitioner | Admitting: Nurse Practitioner

## 2017-05-17 ENCOUNTER — Ambulatory Visit (INDEPENDENT_AMBULATORY_CARE_PROVIDER_SITE_OTHER): Payer: Medicare Other | Admitting: Nurse Practitioner

## 2017-05-17 ENCOUNTER — Other Ambulatory Visit (INDEPENDENT_AMBULATORY_CARE_PROVIDER_SITE_OTHER): Payer: Medicare Other

## 2017-05-17 ENCOUNTER — Encounter: Payer: Self-pay | Admitting: Nurse Practitioner

## 2017-05-17 VITALS — BP 130/78 | HR 57 | Temp 98.0°F | Resp 18 | Ht 72.0 in | Wt 251.0 lb

## 2017-05-17 DIAGNOSIS — R05 Cough: Secondary | ICD-10-CM

## 2017-05-17 DIAGNOSIS — R0602 Shortness of breath: Secondary | ICD-10-CM

## 2017-05-17 DIAGNOSIS — R059 Cough, unspecified: Secondary | ICD-10-CM

## 2017-05-17 LAB — COMPREHENSIVE METABOLIC PANEL
ALK PHOS: 79 U/L (ref 39–117)
ALT: 25 U/L (ref 0–53)
AST: 24 U/L (ref 0–37)
Albumin: 3.8 g/dL (ref 3.5–5.2)
BILIRUBIN TOTAL: 0.7 mg/dL (ref 0.2–1.2)
BUN: 16 mg/dL (ref 6–23)
CO2: 25 meq/L (ref 19–32)
Calcium: 9 mg/dL (ref 8.4–10.5)
Chloride: 104 mEq/L (ref 96–112)
Creatinine, Ser: 1.04 mg/dL (ref 0.40–1.50)
GFR: 75.52 mL/min (ref >60.00)
GLUCOSE: 92 mg/dL (ref 70–99)
Potassium: 3.9 mEq/L (ref 3.5–5.1)
SODIUM: 137 meq/L (ref 135–145)
TOTAL PROTEIN: 6.7 g/dL (ref 6.0–8.3)

## 2017-05-17 LAB — BRAIN NATRIURETIC PEPTIDE: Pro B Natriuretic peptide (BNP): 56 pg/mL (ref 0.0–100.0)

## 2017-05-17 LAB — CBC
HCT: 44.9 % (ref 39.0–52.0)
HEMOGLOBIN: 15.8 g/dL (ref 13.0–17.0)
MCHC: 35.2 g/dL (ref 30.0–36.0)
MCV: 91.9 fl (ref 78.0–100.0)
Platelets: 216 10*3/uL (ref 150.0–400.0)
RBC: 4.89 Mil/uL (ref 4.22–5.81)
RDW: 13 % (ref 11.5–15.5)
WBC: 9.6 10*3/uL (ref 4.0–10.5)

## 2017-05-17 MED ORDER — FLUTICASONE PROPIONATE 50 MCG/ACT NA SUSP: 2.0000 | Freq: Every day | NASAL | 2 refills | Status: DC

## 2017-05-17 NOTE — Progress Notes (Signed)
Name: Daniel Barber   MRN: [id number]    DOB: Oct 14, 1950   Date:05/17/2017       Progress Note  Subjective  Chief Complaint  Chief Complaint  Patient presents with  . Cough    cough, sinus congestion, has gone on for about 3 weeks has tried OTC Robotussin Dm,     HPI  Cough- This is an acute problem The cough began about 3 weeks ago. He describes the cough as productive with clear sputum that is worse at night Initially he felt like he was having symptoms of allergies but the cough persists. He reports wheezing, headaches, body aches, fatigue and decreased appetite. He denies fevers, sore throat, chest pain, heartburn, abdominal pain, nausea, vomiting. He does feel like his symptoms have gotten a little bit better this week. He has tried robitussin DM and nyquil which do not seem to make any difference. He is a former cigarette smoker , quit in 2008   Patient Active Problem List   Diagnosis Date Noted  . Arthritis 12/14/2016  . Gastroesophageal reflux disease without esophagitis 12/14/2016  . Hemorrhoid 03/17/2016  . Acute upper respiratory infection 11/25/2015  . Medicare annual wellness visit, initial 06/11/2015  . Routine general medical examination at a health care facility 05/30/2014    Social History   Tobacco Use  . Smoking status: Current Some Day Smoker    Years: 15.00    Types: Cigarettes, Cigars    Last attempt to quit: 08/18/2006    Years since quitting: 10.7  . Smokeless tobacco: Never Used  . Tobacco comment: quit cigarettes 2008, currently cigar average 1 every 2 months  Substance Use Topics  . Alcohol use: Yes    Comment: 2 DRINKS DAILY     Current Outpatient Medications:  .  naproxen sodium (ANAPROX) 220 MG tablet, Take 220 mg by mouth 2 (two) times daily with a meal., Disp: , Rfl:  .  omeprazole (PRILOSEC) 10 MG capsule, Take 10 mg by mouth as needed. , Disp: , Rfl:   No Known Allergies  ROS  No other specific complaints in a complete  review of systems (except as listed in HPI above).  Objective  Vitals:   05/17/17 1057  BP: 130/78  Pulse: (!) 57  Resp: 18  Temp: 98 F (36.7 C)  TempSrc: Other (Comment)  SpO2: 96%  Weight: 251 lb (113.9 kg)  Height: 6' (1.829 m)    Body mass index is 34.04 kg/m.  Nursing Note and Vital Signs reviewed.  Physical Exam Vital signs reviewed. Constitutional: Patient appears well-developed and well-nourished.  No distress.  HEENT: head atraumatic, normocephalic, pupils equal and reactive to light, EOM's intact, TM's without erythema or bulging,  no maxillary or frontal sinus tenderness , neck supple without lymphadenopathy, oropharynx pink and moist without exudate Cardiovascular: Normal rate, regular rhythm, distal pulses intact. Pulmonary/Chest: Effort normal and breath sounds clear. No respiratory distress or retractions. Neurological: he is alert and oriented to person, place, and time. Coordination, balance, strength, speech and gait are normal.  Skin: Skin is warm and dry. No rash noted.  Psychiatric: Patient has a normal mood and affect. behavior is normal. Judgment and thought content normal.  Assessment & Plan RTC in 1 week for F/U: cough- starting flonase, otc anithistamine, labs today  -Reviewed Health Maintenance: up to date  1. Cough - DG Chest 2 View; Future- STAT CXR today shows no active cardiopulmonary abnormalities, normal heart size and mediastinal contours and clear lungs. Symptoms could  be postviral but do warrant further management. Will order labs today and start flonase and OTC antihistamine for allergies- dosing and side effects discussed Management cough at home, return instructions, and Red flags  including fever >101.39F, chest pain, shortness of breath, new/worsening/un-resolving symptoms, reviewed with patient at time of visit. Follow up and care instructions discussed and provided in AVS. RTC in 1 week for F/U - B Nat Peptide; Future - CBC;  Future - Comprehensive metabolic panel; Future - fluticasone (FLONASE) 50 MCG/ACT nasal spray; Place 2 sprays into both nostrils daily.  Dispense: 16 g; Refill: 2  2. Shortness of breath - B Nat Peptide; Future

## 2017-05-17 NOTE — Patient Instructions (Signed)
Please head downstairs for lab work. If any of your test results are critically abnormal, you will be contacted right away. Your results may be released to your MyChart for viewing before I am able to provide you with my response. I will contact you within a week about your test results and any recommendations for abnormalities.  I have sent a prescription for flonase nasal spray to your pharmacy- you may start with 2 sprays in each nostril daily then reduce to 1 spray in each nostril daily when your symptoms improve You may also take an over the counter allergy medication such as claritin or zyrtec for your symptoms.  Please return in about 1 week, so I can see how you are doing.   Cough, Adult A cough helps to clear your throat and lungs. A cough may last only 2-3 weeks (acute), or it may last longer than 8 weeks (chronic). Many different things can cause a cough. A cough may be a sign of an illness or another medical condition. Follow these instructions at home:  Pay attention to any changes in your cough.  Take medicines only as told by your doctor. ? If you were prescribed an antibiotic medicine, take it as told by your doctor. Do not stop taking it even if you start to feel better. ? Talk with your doctor before you try using a cough medicine.  Drink enough fluid to keep your pee (urine) clear or pale yellow.  If the air is dry, use a cold steam vaporizer or humidifier in your home.  Stay away from things that make you cough at work or at home.  If your cough is worse at night, try using extra pillows to raise your head up higher while you sleep.  Do not smoke, and try not to be around smoke. If you need help quitting, ask your doctor.  Do not have caffeine.  Do not drink alcohol.  Rest as needed. Contact a doctor if:  You have new problems (symptoms).  You cough up yellow fluid (pus).  Your cough does not get better after 2-3 weeks, or your cough gets  worse.  Medicine does not help your cough and you are not sleeping well.  You have pain that gets worse or pain that is not helped with medicine.  You have a fever.  You are losing weight and you do not know why.  You have night sweats. Get help right away if:  You cough up blood.  You have trouble breathing.  Your heartbeat is very fast. This information is not intended to replace advice given to you by your health care provider. Make sure you discuss any questions you have with your health care provider. Document Released: 09/18/2010 Document Revised: 06/13/2015 Document Reviewed: 03/14/2014 Elsevier Interactive Patient Education  [employer].

## 2017-05-26 ENCOUNTER — Ambulatory Visit (INDEPENDENT_AMBULATORY_CARE_PROVIDER_SITE_OTHER): Payer: Medicare Other | Admitting: Nurse Practitioner

## 2017-05-26 ENCOUNTER — Encounter: Payer: Self-pay | Admitting: Nurse Practitioner

## 2017-05-26 ENCOUNTER — Other Ambulatory Visit (INDEPENDENT_AMBULATORY_CARE_PROVIDER_SITE_OTHER): Payer: Medicare Other

## 2017-05-26 VITALS — BP 120/70 | HR 62 | Temp 98.2°F | Resp 16 | Ht 72.0 in | Wt 251.0 lb

## 2017-05-26 DIAGNOSIS — R05 Cough: Secondary | ICD-10-CM | POA: Diagnosis not present

## 2017-05-26 DIAGNOSIS — R059 Cough, unspecified: Secondary | ICD-10-CM

## 2017-05-26 DIAGNOSIS — Z13220 Encounter for screening for lipoid disorders: Secondary | ICD-10-CM

## 2017-05-26 LAB — LIPID PANEL
Cholesterol: 184 mg/dL (ref 0–200)
HDL: 34.5 mg/dL — ABNORMAL LOW (ref >39.00)
LDL Cholesterol: 111 mg/dL — ABNORMAL HIGH (ref 0–99)
NonHDL: 149.13
Total CHOL/HDL Ratio: 5
Triglycerides: 193 mg/dL — ABNORMAL HIGH (ref 0.0–149.0)
VLDL: 38.6 mg/dL (ref 0.0–40.0)

## 2017-05-26 MED ORDER — PREDNISONE 20 MG PO TABS: 40.0000 mg | ORAL_TABLET | Freq: Every day | ORAL | 0 refills | Status: DC

## 2017-05-26 MED ORDER — AZITHROMYCIN 250 MG PO TABS: ORAL_TABLET | ORAL | 0 refills | Status: DC

## 2017-05-26 NOTE — Progress Notes (Signed)
Name: Daniel Barber   MRN: [id number]    DOB: [birth year]   Date:05/26/2017       Progress Note  Subjective  Chief Complaint  Chief Complaint  Patient presents with  . Follow-up    still has cough but much better than last week    HPI  Cough- He was seen last week for acute cough. CXR, CBC, CMET and BNP were done and all results were WNL He was instructed to start flonase, OTC antihistamine for allergy type symptoms. He returns today for F/u of the cough- he has been using zyrtec and flonase daily as instructed. He reports he feels better overall, not as tired or achy and allergy symptoms have improved. His cough has also improved but persists, especially notices the cough at night and sometimes hears himself wheezing when lying down to sleep. He has noticed some clear sputum with the cough. Denies fevers, hoarseness, dysphagia, regurgitation, chest pain, shortness of breath, edema.  Patient Active Problem List   Diagnosis Date Noted  . Arthritis 12/14/2016  . Gastroesophageal reflux disease without esophagitis 12/14/2016  . Hemorrhoid 03/17/2016  . Acute upper respiratory infection 11/25/2015  . Medicare annual wellness visit, initial 06/11/2015  . Routine general medical examination at a health care facility 05/30/2014    Social History   Tobacco Use  . Smoking status: Current Some Day Smoker    Years: 15.00    Types: Cigarettes, Cigars    Last attempt to quit: 08/18/2006    Years since quitting: 10.7  . Smokeless tobacco: Never Used  . Tobacco comment: quit cigarettes 2008, currently cigar average 1 every 2 months  Substance Use Topics  . Alcohol use: Yes    Comment: 2 DRINKS DAILY     Current Outpatient Medications:  .  cetirizine (ZYRTEC) 10 MG tablet, Take 10 mg by mouth daily., Disp: , Rfl:  .  fluticasone (FLONASE) 50 MCG/ACT nasal spray, Place 2 sprays into both nostrils daily., Disp: 16 g, Rfl: 2 .  naproxen sodium (ANAPROX) 220 MG tablet, Take 220 mg by  mouth 2 (two) times daily with a meal., Disp: , Rfl:  .  omeprazole (PRILOSEC) 10 MG capsule, Take 10 mg by mouth as needed. , Disp: , Rfl:  .  azithromycin (ZITHROMAX) 250 MG tablet, 2 tablets on day 1 then 1 tablet every day until complete, Disp: 6 tablet, Rfl: 0 .  predniSONE (DELTASONE) 20 MG tablet, Take 2 tablets (40 mg total) by mouth daily with breakfast., Disp: 10 tablet, Rfl: 0  No Known Allergies  ROS  No other specific complaints in a complete review of systems (except as listed in HPI above).  Objective  Vitals:   05/26/17 1323  BP: 120/70  Pulse: 62  Resp: 16  Temp: 98.2 F (36.8 C)  TempSrc: Oral  SpO2: 95%  Weight: 251 lb (113.9 kg)  Height: 6' (1.829 m)    Body mass index is 34.04 kg/m.  Nursing Note and Vital Signs reviewed.  Physical Exam  Constitutional: Patient appears well-developed and well-nourished.  No distress.  HEENT: head atraumatic, normocephalic, pupils equal and reactive to light, EOM's intact, neck supple without lymphadenopathy, oropharynx pink and moist without exudate Cardiovascular: Normal rate, regular rhythm, distal pulses intact. No BLE edema. Pulmonary/Chest: Effort normal and breath sounds clear. No respiratory distress or retractions. Skin: Warm and dry. No rash noted Psychiatric: Patient has a normal mood and affect. behavior is normal. Judgment and thought content normal.  Assessment & Plan RTC  in about 6 months for routine F/U  -Reviewed Health Maintenance: -Recommend AWV with Sharee Pimple -Screening for cholesterol level- Lipid panel; Future  Cough ?COPD exacerbation, no evidence of COPD on CXR but patient has been past smoker Will treat with course of abx, steroids- dosing and side effects discussed  -Red flags and when to present for emergency care or RTC including fever >101.33F, chest pain, shortness of breath, new/worsening/un-resolving symptoms,reviewed with patient at time of visit. Follow up and care instructions discussed  and provided in AVS. - azithromycin (ZITHROMAX) 250 MG tablet; 2 tablets on day 1 then 1 tablet every day until complete  Dispense: 6 tablet; Refill: 0 - predniSONE (DELTASONE) 20 MG tablet; Take 2 tablets (40 mg total) by mouth daily with breakfast.  Dispense: 10 tablet; Refill: 0

## 2017-05-26 NOTE — Patient Instructions (Addendum)
Start zithromax antibiotic and prednisone steroid for your cough, as instructed on medication bottles. Please follow up if your cough persists after this  If you have medicare related insurance (such as traditional Medicare, [employer], [employer], or similar), Please make an appointment at the scheduling desk with Sharee Pimple, the [employer], for your Wellness visit in this office, which is a benefit with your insurance. I have also placed an order for annual cholesterol panel, please stop by lab at your convenience to have this drawn.  I will plan to see you back around November for an annual visit, or sooner if you need me.

## 2017-06-15 ENCOUNTER — Telehealth: Payer: Medicare Other | Admitting: Family

## 2017-06-15 DIAGNOSIS — J029 Acute pharyngitis, unspecified: Secondary | ICD-10-CM

## 2017-06-15 MED ORDER — PREDNISONE 5 MG PO TABS: 5.0000 mg | ORAL_TABLET | ORAL | 0 refills | Status: DC

## 2017-06-15 MED ORDER — BENZONATATE 100 MG PO CAPS: 100.0000 mg | ORAL_CAPSULE | Freq: Three times a day (TID) | ORAL | 0 refills | Status: DC | PRN

## 2017-06-15 NOTE — Progress Notes (Signed)
Thank you for the details you included in the comment boxes. Those details are very helpful in determining the best course of treatment for you and help us to provide the best care.  We are sorry that you are not feeling well.  Here is how we plan to help!  Based on your presentation I believe you most likely have A cough due to a virus.  This is called viral bronchitis and is best treated by rest, plenty of fluids and control of the cough.  You may use Ibuprofen or Tylenol as directed to help your symptoms.     In addition you may use A non-prescription cough medication called Mucinex DM: take 2 tablets every 12 hours. and A prescription cough medication called Tessalon Perles 100mg. You may take 1-2 capsules every 8 hours as needed for your cough.  Prednisone 5 mg daily for 6 days (see taper instructions below)  Directions for 6 day taper: Day 1: 2 tablets before breakfast, 1 after both lunch & dinner and 2 at bedtime Day 2: 1 tab before breakfast, 1 after both lunch & dinner and 2 at bedtime Day 3: 1 tab at each meal & 1 at bedtime Day 4: 1 tab at breakfast, 1 at lunch, 1 at bedtime Day 5: 1 tab at breakfast & 1 tab at bedtime Day 6: 1 tab at breakfast   From your responses in the eVisit questionnaire you describe inflammation in the upper respiratory tract which is causing a significant cough.  This is commonly called Bronchitis and has four common causes:    Allergies  Viral Infections  Acid Reflux  Bacterial Infection Allergies, viruses and acid reflux are treated by controlling symptoms or eliminating the cause. An example might be a cough caused by taking certain blood pressure medications. You stop the cough by changing the medication. Another example might be a cough caused by acid reflux. Controlling the reflux helps control the cough.  USE OF BRONCHODILATOR ("RESCUE") INHALERS: There is a risk from using your bronchodilator too frequently.  The risk is that over-reliance on  a medication which only relaxes the muscles surrounding the breathing tubes can reduce the effectiveness of medications prescribed to reduce swelling and congestion of the tubes themselves.  Although you feel brief relief from the bronchodilator inhaler, your asthma may actually be worsening with the tubes becoming more swollen and filled with mucus.  This can delay other crucial treatments, such as oral steroid medications. If you need to use a bronchodilator inhaler daily, several times per day, you should discuss this with your provider.  There are probably better treatments that could be used to keep your asthma under control.     HOME CARE . Only take medications as instructed by your medical team. . Complete the entire course of an antibiotic. . Drink plenty of fluids and get plenty of rest. . Avoid close contacts especially the very young and the elderly . Cover your mouth if you cough or cough into your sleeve. . Always remember to wash your hands . A steam or ultrasonic humidifier can help congestion.   GET HELP RIGHT AWAY IF: . You develop worsening fever. . You become short of breath . You cough up blood. . Your symptoms persist after you have completed your treatment plan MAKE SURE YOU   Understand these instructions.  Will watch your condition.  Will get help right away if you are not doing well or get worse.  Your e-visit answers were reviewed   by a board certified advanced clinical practitioner to complete your personal care plan.  Depending on the condition, your plan could have included both over the counter or prescription medications. If there is a problem please reply  once you have received a response from your provider. Your safety is important to us.  If you have drug allergies check your prescription carefully.    You can use MyChart to ask questions about today's visit, request a non-urgent call back, or ask for a work or school excuse for 24 hours related to this  e-Visit. If it has been greater than 24 hours you will need to follow up with your provider, or enter a new e-Visit to address those concerns. You will get an e-mail in the next two days asking about your experience.  I hope that your e-visit has been valuable and will speed your recovery. Thank you for using e-visits.    

## 2017-07-13 ENCOUNTER — Encounter: Payer: Self-pay | Admitting: Nurse Practitioner

## 2017-07-13 ENCOUNTER — Ambulatory Visit (INDEPENDENT_AMBULATORY_CARE_PROVIDER_SITE_OTHER): Payer: Medicare Other | Admitting: Nurse Practitioner

## 2017-07-13 VITALS — BP 126/80 | HR 53 | Temp 98.5°F | Resp 16 | Ht 72.0 in | Wt 255.8 lb

## 2017-07-13 DIAGNOSIS — J029 Acute pharyngitis, unspecified: Secondary | ICD-10-CM

## 2017-07-13 DIAGNOSIS — R059 Cough, unspecified: Secondary | ICD-10-CM

## 2017-07-13 DIAGNOSIS — R05 Cough: Secondary | ICD-10-CM

## 2017-07-13 LAB — POCT RAPID STREP A (OFFICE): RAPID STREP A SCREEN: NEGATIVE

## 2017-07-13 MED ORDER — DOXYCYCLINE HYCLATE 100 MG PO TABS: 100.0000 mg | ORAL_TABLET | Freq: Two times a day (BID) | ORAL | 0 refills | Status: DC

## 2017-07-13 NOTE — Progress Notes (Signed)
Name: Daniel Barber   MRN: [id number]    DOB: 1950-12-14   Date:07/13/2017       Progress Note  Subjective  Chief Complaint  Chief Complaint  Patient presents with  . Cough    Sore throat and cough that has come back, states it started back yesterday but the cough has been around longer    HPI  Daniel Barber is here today for evaluation of recurrent cough and sore throat. I last saw him on 05/26/17 for cough and he was given a course of z-pak, prednisone. He was then treated with another course of prednisone at the end of May during an e-visit for viral pharyngitis. He says that his cough has been intermittent since our last visit, Describes as a dry cough. He woke yesterday morning with a sore throat, which has persisted since onset, which is why he decided to make the appointment today. He says the back of his throat looks red and he also noted some white drainage on the back of his throat. He says that it is very painful to swallow, but he has been trying to stay hydrated and appetite is okay. He reports ear pressure, congestion. He denies fevers, weakness, headache, heartburn, chest pain, shortness of breath, nausea, vomiting, abdominal pain. No recent sick household contacts, but he did just return home from a trip to [place].  Patient Active Problem List   Diagnosis Date Noted  . Arthritis 12/14/2016  . Gastroesophageal reflux disease without esophagitis 12/14/2016  . Hemorrhoid 03/17/2016  . Acute upper respiratory infection 11/25/2015  . Medicare annual wellness visit, initial 06/11/2015  . Routine general medical examination at a health care facility 05/30/2014    Social History   Tobacco Use  . Smoking status: Current Some Day Smoker    Years: 15.00    Types: Cigarettes, Cigars    Last attempt to quit: 08/18/2006    Years since quitting: 10.9  . Smokeless tobacco: Never Used  . Tobacco comment: quit cigarettes 2008, currently cigar average 1 every 2 months  Substance  Use Topics  . Alcohol use: Yes    Comment: 2 DRINKS DAILY     Current Outpatient Medications:  .  cetirizine (ZYRTEC) 10 MG tablet, Take 10 mg by mouth daily., Disp: , Rfl:  .  fluticasone (FLONASE) 50 MCG/ACT nasal spray, Place 2 sprays into both nostrils daily., Disp: 16 g, Rfl: 2 .  naproxen sodium (ANAPROX) 220 MG tablet, Take 220 mg by mouth 2 (two) times daily with a meal., Disp: , Rfl:  .  omeprazole (PRILOSEC) 10 MG capsule, Take 10 mg by mouth as needed. , Disp: , Rfl:   No Known Allergies  ROS   No other specific complaints in a complete review of systems (except as listed in HPI above).  Objective  Vitals:   07/13/17 1145  BP: 126/80  Pulse: (!) 53  Resp: 16  Temp: 98.5 F (36.9 C)  TempSrc: Oral  SpO2: 97%  Weight: 255 lb 12.8 oz (116 kg)  Height: 6' (1.829 m)  HR stable  Body mass index is 34.69 kg/m.  Nursing Note and Vital Signs reviewed.  Physical Exam  Constitutional: Patient appears well-developed and well-nourished. No distress.  HEENT: head atraumatic, normocephalic, pupils equal and reactive to light, EOM's intact, TM's without erythema or bulging, no maxillary or frontal sinus tenderness , neck supple with cervical adenopathy, oropharynx erythematous with white exudate. Cardiovascular: Normal rate, regular rhythm, S1/S2 present.  Distal pulses intact Pulmonary/Chest:  Effort normal and breath sounds clear. No respiratory distress or retractions. Neurological: He is alert and oriented to person, place, and time. No cranial nerve deficit. Coordination, balance, strength, speech and gait are normal.  Skin: Skin is warm and dry. No rash noted. No erythema.  Psychiatric: Patient has a normal mood and affect. behavior is normal. Judgment and thought content normal.   Assessment & Plan RTC for scheduled F/U, or sooner if needed  -Reviewed Health Maintenance: up to date . 1. Cough - doxycycline (VIBRA-TABS) 100 MG tablet; Take 1 tablet (100 mg  total) by mouth 2 (two) times daily.  Dispense: 20 tablet; Refill: 0-dosing and side effects discussed  2. Sore throat - POCT rapid strep A-negative - doxycycline (VIBRA-TABS) 100 MG tablet; Take 1 tablet (100 mg total) by mouth 2 (two) times daily.  Dispense: 20 tablet; Refill: 0  Will treat with course of doxycyline today, for persistent symptoms after z-pak and 2 steroid courses. Instructed to continue flonase, zyrtec daily. Consider referral to pulmonology, allergist if symptoms persist  Home management of cough and sore throat, Red flags and when to present for emergency care or RTC including fever >101.53F, chest pain, shortness of breath, new/worsening/un-resolving symptoms, reviewed with patient at time of visit. Follow up and care instructions discussed and provided in AVS.

## 2017-07-13 NOTE — Patient Instructions (Addendum)
I have sent an antibiotic doxycycline 100mg  twice a day for 10 days.  Please continue zyrtec and flonase daily.  Please let me know if your cough persists after this, we may consider sending you to a lung or allergy doctor.   Cough, Adult A cough helps to clear your throat and lungs. A cough may last only 2-3 weeks (acute), or it may last longer than 8 weeks (chronic). Many different things can cause a cough. A cough may be a sign of an illness or another medical condition. Follow these instructions at home:  Pay attention to any changes in your cough.  Take medicines only as told by your doctor. ? If you were prescribed an antibiotic medicine, take it as told by your doctor. Do not stop taking it even if you start to feel better. ? Talk with your doctor before you try using a cough medicine.  Drink enough fluid to keep your pee (urine) clear or pale yellow.  If the air is dry, use a cold steam vaporizer or humidifier in your home.  Stay away from things that make you cough at work or at home.  If your cough is worse at night, try using extra pillows to raise your head up higher while you sleep.  Do not smoke, and try not to be around smoke. If you need help quitting, ask your doctor.  Do not have caffeine.  Do not drink alcohol.  Rest as needed. Contact a doctor if:  You have new problems (symptoms).  You cough up yellow fluid (pus).  Your cough does not get better after 2-3 weeks, or your cough gets worse.  Medicine does not help your cough and you are not sleeping well.  You have pain that gets worse or pain that is not helped with medicine.  You have a fever.  You are losing weight and you do not know why.  You have night sweats. Get help right away if:  You cough up blood.  You have trouble breathing.  Your heartbeat is very fast. This information is not intended to replace advice given to you by your health care provider. Make sure you discuss any  questions you have with your health care provider. Document Released: 09/18/2010 Document Revised: 06/13/2015 Document Reviewed: 03/14/2014 Elsevier Interactive Patient Education  [employer].

## 2017-09-08 DIAGNOSIS — H40033 Anatomical narrow angle, bilateral: Secondary | ICD-10-CM | POA: Diagnosis not present

## 2017-09-08 DIAGNOSIS — H21541 Posterior synechiae (iris), right eye: Secondary | ICD-10-CM | POA: Diagnosis not present

## 2017-09-08 DIAGNOSIS — H2513 Age-related nuclear cataract, bilateral: Secondary | ICD-10-CM | POA: Diagnosis not present

## 2017-12-14 NOTE — Progress Notes (Signed)
Subjective:   Daniel Barber is a 67 y.o. male who presents for Medicare Annual/Subsequent preventive examination.  Review of Systems:  No ROS.  Medicare Wellness Visit. Additional risk factors are reflected in the social history.  Cardiac Risk Factors include: advanced age (>14men, >89 women);male gender Sleep patterns: feels rested on waking, gets up 1 times nightly to void and sleeps 8-9 hours nightly.    Home Safety/Smoke Alarms: Feels safe in home. Smoke alarms in place.  Living environment; residence and Firearm Safety: 1-story house/ trailer, no firearms. Seat Belt Safety/Bike Helmet: Wears seat belt.     Objective:    Vitals: BP 137/68   Pulse (!) 56   Temp 97.9 F (36.6 C)   Resp 17   Ht 6' (1.829 m)   Wt 259 lb (117.5 kg)   SpO2 99%   BMI 35.13 kg/m   Body mass index is 35.13 kg/m.  Advanced Directives 12/15/2017 08/28/2016  Does Patient Have a Medical Advance Directive? No No  Would patient like information on creating a medical advance directive? No - Patient declined Yes (MAU/Ambulatory/Procedural Areas - Information given)    Tobacco Social History   Tobacco Use  Smoking Status Light Tobacco Smoker  . Years: 0.00  . Types: Cigars  . Last attempt to quit: 08/18/2006  . Years since quitting: 11.3  Smokeless Tobacco Never Used  Tobacco Comment   smokes an occasional cigar 4-5 timees per year     Ready to quit: Not Answered Counseling given: Not Answered Comment: smokes an occasional cigar 4-5 timees per year  Past Medical History:  Diagnosis Date  . Arthritis   . COPD, mild ([place])   . External hemorrhoids    symptomatic  . GERD (gastroesophageal reflux disease)   . History of adenomatous polyp of colon followed by dr Daniel Barber   tubular adenoma's 1995;  2000;  2006;  2016  . History of malignant melanoma of skin 2017   excision posterior neck (localized)  . History of prostate cancer followed by pcp   1999--  radical prostatectomy/  per pt  last PSA less than 0.1 on May 2017  . Nocturia   . Wears glasses    Past Surgical History:  Procedure Laterality Date  . COLONOSCOPY  last one 07-27-2014  dr Daniel Barber  . ELBOW SURGERY Right 1989  . HEMORRHOID SURGERY N/A 08/28/2016   Procedure: HEMORRHOIDECTOMY;  Surgeon: Daniel Riley, MD;  Location: [facility];  Service: General;  Laterality: N/A;  . KNEE ARTHROSCOPY Left 2002  approx.  Marland Kitchen MELANOMA EXCISION  2017   posterior neck (localized)  . PROSTATECTOMY  1999   dr Daniel Barber   Family History  Problem Relation Age of Onset  . Colon cancer Mother   . Heart attack Father   . Healthy Maternal Grandmother   . Healthy Maternal Grandfather   . Healthy Paternal Grandmother   . Healthy Paternal Grandfather    Social History   Socioeconomic History  . Marital status: Married    Spouse name: Not on file  . Number of children: 2  . Years of education: 55  . Highest education level: Not on file  Occupational History  . Occupation: Financial risk analyst  Social Needs  . Financial resource strain: Not hard at all  . Food insecurity:    Worry: Never true    Inability: Never true  . Transportation needs:    Medical: No    Non-medical: No  Tobacco Use  .  Smoking status: Light Tobacco Smoker    Years: 0.00    Types: Cigars    Last attempt to quit: 08/18/2006    Years since quitting: 11.3  . Smokeless tobacco: Never Used  . Tobacco comment: smokes an occasional cigar 4-5 timees per year  Substance and Sexual Activity  . Alcohol use: Yes    Comment: 2 DRINKS DAILY  . Drug use: No  . Sexual activity: Yes  Lifestyle  . Physical activity:    Days per week: 0 days    Minutes per session: 0 min  . Stress: Not on file  Relationships  . Social connections:    Talks on phone: More than three times a week    Gets together: More than three times a week    Attends religious service: More than 4 times per year    Active member of club or organization: Yes    Attends  meetings of clubs or organizations: More than 4 times per year    Relationship status: Married  Other Topics Concern  . Not on file  Social History Narrative   Fun: Fish, shooting, antique cars   Denies religious beliefs effecting health care.     Outpatient Encounter Medications as of 12/15/2017  Medication Sig  . naproxen sodium (ANAPROX) 220 MG tablet Take 220 mg by mouth 2 (two) times daily with a meal.  . omeprazole (PRILOSEC) 10 MG capsule Take 10 mg by mouth as needed.   . [DISCONTINUED] cetirizine (ZYRTEC) 10 MG tablet Take 10 mg by mouth daily.  . [DISCONTINUED] doxycycline (VIBRA-TABS) 100 MG tablet Take 1 tablet (100 mg total) by mouth 2 (two) times daily.  . [DISCONTINUED] fluticasone (FLONASE) 50 MCG/ACT nasal spray Place 2 sprays into both nostrils daily.   No facility-administered encounter medications on file as of 12/15/2017.     Activities of Daily Living In your present state of health, do you have any difficulty performing the following activities: 12/15/2017  Hearing? N  Vision? N  Difficulty concentrating or making decisions? N  Walking or climbing stairs? N  Dressing or bathing? N  Doing errands, shopping? N  Preparing Food and eating ? N  Using the Toilet? N  In the past six months, have you accidently leaked urine? N  Do you have problems with loss of bowel control? N  Managing your Medications? N  Managing your Finances? N  Housekeeping or managing your Housekeeping? N  Some recent data might be hidden    Patient Care Team: Daniel Sell, NP as PCP - General (Nurse Practitioner)   Assessment:   This is a routine wellness examination for Daniel Barber. Physical assessment deferred to PCP.   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 Encouraged patient to increase daily water and healthy fluid intake.  Goals    . Patient Stated     I want to get back into the Silver [employer]akers program and start to exercise in the [facility].       Fall Risk Fall Risk  12/15/2017 06/11/2015  Falls in the past year? 0 Yes  Number falls in past yr: - 1  Injury with Fall? - No  Risk Factor Category  - (  No Data)  Comment - Fell while working.    Depression Screen PHQ 2/9 Scores 12/15/2017 06/11/2015  PHQ - 2 Score 0 0    Cognitive Function       Ad8 score reviewed for issues:  Issues making decisions: no  Less interest in hobbies / activities: no  Repeats questions, stories (family complaining): no  Trouble using ordinary gadgets (microwave, computer, phone):no  Forgets the month or year: no  Mismanaging finances: no  Remembering appts: no  Daily problems with thinking and/or memory: no Ad8 score is= 0  Immunization History  Administered Date(s) Administered  . Influenza, High Dose Seasonal PF 12/05/2014, 11/25/2015, 10/30/2016, 12/15/2017  . Pneumococcal Conjugate-13 12/19/2014  . Pneumococcal Polysaccharide-23 06/18/2016  . Td 05/30/2014  . Zoster 05/30/2014   Screening Tests Health Maintenance  Topic Date Due  . TETANUS/TDAP  05/29/2024  . COLONOSCOPY  08/05/2024  . INFLUENZA VACCINE  Completed  . Hepatitis C Screening  Completed  . PNA vac Low Risk Adult  Completed      Plan:     Continue doing brain stimulating activities (puzzles, reading, adult coloring books, staying active) to keep memory sharp.   Continue to eat heart healthy diet (full of fruits, vegetables, whole grains, lean protein, water--limit salt, fat, and sugar intake) and increase physical activity as tolerated.  I have personally reviewed and noted the following in the patient's  chart:   . Medical and social history . Use of alcohol, tobacco or illicit drugs  . Current medications and supplements . Functional ability and status . Nutritional status . Physical activity . Advanced directives . List of other physicians . Vitals . Screenings to include cognitive, depression, and falls . Referrals and appointments  In addition, I have reviewed and discussed with patient certain preventive protocols, quality metrics, and best practice recommendations. A written personalized care plan for preventive services as well as general preventive health recommendations were provided to patient.     Michiel Cowboy, RN  12/15/2017

## 2017-12-15 ENCOUNTER — Encounter: Payer: Self-pay | Admitting: Nurse Practitioner

## 2017-12-15 ENCOUNTER — Encounter: Payer: Medicare Other | Admitting: Nurse Practitioner

## 2017-12-15 ENCOUNTER — Ambulatory Visit (INDEPENDENT_AMBULATORY_CARE_PROVIDER_SITE_OTHER): Payer: Medicare Other | Admitting: Nurse Practitioner

## 2017-12-15 ENCOUNTER — Ambulatory Visit (INDEPENDENT_AMBULATORY_CARE_PROVIDER_SITE_OTHER): Payer: Medicare Other | Admitting: *Deleted

## 2017-12-15 VITALS — BP 137/68 | HR 56 | Temp 97.9°F | Resp 17 | Ht 72.0 in | Wt 259.0 lb

## 2017-12-15 DIAGNOSIS — K219 Gastro-esophageal reflux disease without esophagitis: Secondary | ICD-10-CM | POA: Diagnosis not present

## 2017-12-15 DIAGNOSIS — M1990 Unspecified osteoarthritis, unspecified site: Secondary | ICD-10-CM | POA: Diagnosis not present

## 2017-12-15 DIAGNOSIS — Z0000 Encounter for general adult medical examination without abnormal findings: Secondary | ICD-10-CM | POA: Diagnosis not present

## 2017-12-15 DIAGNOSIS — E785 Hyperlipidemia, unspecified: Secondary | ICD-10-CM | POA: Insufficient documentation

## 2017-12-15 DIAGNOSIS — Z8639 Personal history of other endocrine, nutritional and metabolic disease: Secondary | ICD-10-CM | POA: Insufficient documentation

## 2017-12-15 DIAGNOSIS — Z23 Encounter for immunization: Secondary | ICD-10-CM

## 2017-12-15 NOTE — Assessment & Plan Note (Signed)
We discussed ASCVD risk score, indication for statin, he declines He is really not a daily smoker, smokes 4-5 cigars per year, which should reduce his risk some We discussed rechecking lipid panel today, he declines and would prefer to continue annual lipid panels at this time Home management of HLD, healthy diet, exercise, discussed and printed on AVS The 10-year ASCVD risk score Mikey Bussing DC Brooke Bonito., et al., 2013) is: 25.2%   Values used to calculate the score:     Age: 67 years     Sex: Male     Is Non-Hispanic African American: No     Diabetic: No     Tobacco smoker: Yes     Systolic Blood Pressure: 903 mmHg     Is BP treated: No     HDL Cholesterol: 34.5 mg/dL     Total Cholesterol: 184 mg/dL

## 2017-12-15 NOTE — Patient Instructions (Addendum)
Continue doing brain stimulating activities (puzzles, reading, adult coloring books, staying active) to keep memory sharp.   Continue to eat heart healthy diet (full of fruits, vegetables, whole grains, lean protein, water--limit salt, fat, and sugar intake) and increase physical activity as tolerated.   Daniel Barber , Thank you for taking time to come for your Medicare Wellness Visit. I appreciate your ongoing commitment to your health goals. Please review the following plan we discussed and let me know if I can assist you in the future.   These are the goals we discussed: Goals    . Patient Stated     I want to get back into the [employer] program and start to exercise in the [facility].       This is a list of the screening recommended for you and due dates:  Health Maintenance  Topic Date Due  . Flu Shot  08/19/2017  . Tetanus Vaccine  05/29/2024  . Colon Cancer Screening  08/05/2024  .  Hepatitis C: One time screening is recommended by Center for Disease Control  (CDC) for  adults born from [birth year] through 1966.   Completed  . Pneumonia vaccines  Completed     Health Maintenance, Male A healthy lifestyle and preventive care is important for your health and wellness. Ask your health care provider about what schedule of regular examinations is right for you. What should I know about weight and diet? Eat a Healthy Diet  Eat plenty of vegetables, fruits, whole grains, low-fat dairy products, and lean protein.  Do not eat a lot of foods high in solid fats, added sugars, or salt.  Maintain a Healthy Weight Regular exercise can help you achieve or maintain a healthy weight. You should:  Do at least 150 minutes of exercise each week. The exercise should increase your heart rate and make you sweat (moderate-intensity exercise).  Do strength-training exercises at least twice a week.  Watch Your Levels of Cholesterol and Blood Lipids  Have your blood tested for lipids and cholesterol  every 5 years starting at 68 years of age. If you are at high risk for heart disease, you should start having your blood tested when you are 68 years old. You may need to have your cholesterol levels checked more often if: ? Your lipid or cholesterol levels are high. ? You are older than 68 years of age. ? You are at high risk for heart disease.  What should I know about cancer screening? Many types of cancers can be detected early and may often be prevented. Lung Cancer  You should be screened every year for lung cancer if: ? You are a current smoker who has smoked for at least 30 years. ? You are a former smoker who has quit within the past 15 years.  Talk to your health care provider about your screening options, when you should start screening, and how often you should be screened.  Colorectal Cancer  Routine colorectal cancer screening usually begins at 68 years of age and should be repeated every 5-10 years until you are 68 years old. You may need to be screened more often if early forms of precancerous polyps or small growths are found. Your health care provider may recommend screening at an earlier age if you have risk factors for colon cancer.  Your health care provider may recommend using home test kits to check for hidden blood in the stool.  A small camera at the end of a tube can  be used to examine your colon (sigmoidoscopy or colonoscopy). This checks for the earliest forms of colorectal cancer.  Prostate and Testicular Cancer  Depending on your age and overall health, your health care provider may do certain tests to screen for prostate and testicular cancer.  Talk to your health care provider about any symptoms or concerns you have about testicular or prostate cancer.  Skin Cancer  Check your skin from head to toe regularly.  Tell your health care provider about any new moles or changes in moles, especially if: ? There is a change in a mole's size, shape, or  color. ? You have a mole that is larger than a pencil eraser.  Always use sunscreen. Apply sunscreen liberally and repeat throughout the day.  Protect yourself by wearing long sleeves, pants, a wide-brimmed hat, and sunglasses when outside.  What should I know about heart disease, diabetes, and high blood pressure?  If you are 10-8 years of age, have your blood pressure checked every 3-5 years. If you are 59 years of age or older, have your blood pressure checked every year. You should have your blood pressure measured twice-once when you are at a hospital or clinic, and once when you are not at a hospital or clinic. Record the average of the two measurements. To check your blood pressure when you are not at a hospital or clinic, you can use: ? An automated blood pressure machine at a pharmacy. ? A home blood pressure monitor.  Talk to your health care provider about your target blood pressure.  If you are between 33-64 years old, ask your health care provider if you should take aspirin to prevent heart disease.  Have regular diabetes screenings by checking your fasting blood sugar level. ? If you are at a normal weight and have a low risk for diabetes, have this test once every three years after the age of 22. ? If you are overweight and have a high risk for diabetes, consider being tested at a younger age or more often.  A one-time screening for abdominal aortic aneurysm (AAA) by ultrasound is recommended for men aged 22-75 years who are current or former smokers. What should I know about preventing infection? Hepatitis B If you have a higher risk for hepatitis B, you should be screened for this virus. Talk with your health care provider to find out if you are at risk for hepatitis B infection. Hepatitis C Blood testing is recommended for:  Everyone born from [birth year] through 1966.  Anyone with known risk factors for hepatitis C.  Sexually Transmitted Diseases (STDs)  You should be  screened each year for STDs including gonorrhea and chlamydia if: ? You are sexually active and are younger than 68 years of age. ? You are older than 68 years of age and your health care provider tells you that you are at risk for this type of infection. ? Your sexual activity has changed since you were last screened and you are at an increased risk for chlamydia or gonorrhea. Ask your health care provider if you are at risk.  Talk with your health care provider about whether you are at high risk of being infected with HIV. Your health care provider may recommend a prescription medicine to help prevent HIV infection.  What else can I do?  Schedule regular health, dental, and eye exams.  Stay current with your vaccines (immunizations).  Do not use any tobacco products, such as cigarettes, chewing tobacco, and e-cigarettes.  If you need help quitting, ask your health care provider.  Limit alcohol intake to no more than 2 drinks per day. One drink equals 12 ounces of beer, 5 ounces of wine, or 1 ounces of hard liquor.  Do not use street drugs.  Do not share needles.  Ask your health care provider for help if you need support or information about quitting drugs.  Tell your health care provider if you often feel depressed.  Tell your health care provider if you have ever been abused or do not feel safe at home. This information is not intended to replace advice given to you by your health care provider. Make sure you discuss any questions you have with your health care provider. Document Released: 07/04/2007 Document Revised: 09/04/2015 Document Reviewed: 10/09/2014 Elsevier Interactive Patient Education  [employer].

## 2017-12-15 NOTE — Progress Notes (Signed)
Medical screening examination/treatment/procedure(s) were performed by the Wellness Coach, RN. As primary care provider I was immediately available for consulation/collaboration. I agree with above documentation.  [place], NP  

## 2017-12-15 NOTE — Patient Instructions (Signed)
Cholesterol Cholesterol is a fat. Your body needs a small amount of cholesterol. Cholesterol (plaque) may build up in your blood vessels (arteries). That makes you more likely to have a heart attack or stroke. You cannot feel your cholesterol level. Having a blood test is the only way to find out if your level is high. Keep your test results. Work with your doctor to keep your cholesterol at a good level. What do the results mean?  Total cholesterol is how much cholesterol is in your blood.  LDL is bad cholesterol. This is the type that can build up. Try to have low LDL.  HDL is good cholesterol. It cleans your blood vessels and carries LDL away. Try to have high HDL.  Triglycerides are fat that the body can store or burn for energy. What are good levels of cholesterol?  Total cholesterol below 200.  LDL below 100 is good for people who have health risks. LDL below 70 is good for people who have very high risks.  HDL above 40 is good. It is best to have HDL of 60 or higher.  Triglycerides below 150. How can I lower my cholesterol? Diet Follow your diet program as told by your doctor.  Choose fish, white meat chicken, or turkey that is roasted or baked. Try not to eat red meat, fried foods, sausage, or lunch meats.  Eat lots of fresh fruits and vegetables.  Choose whole grains, beans, pasta, potatoes, and cereals.  Choose olive oil, corn oil, or canola oil. Only use small amounts.  Try not to eat butter, mayonnaise, shortening, or palm kernel oils.  Try not to eat foods with trans fats.  Choose low-fat or nonfat dairy foods. ? Drink skim or nonfat milk. ? Eat low-fat or nonfat yogurt and cheeses. ? Try not to drink whole milk or cream. ? Try not to eat ice cream, egg yolks, or full-fat cheeses.  Healthy desserts include angel food cake, ginger snaps, animal crackers, hard candy, popsicles, and low-fat or nonfat frozen yogurt. Try not to eat pastries, cakes, pies, and  cookies.  Exercise Follow your exercise program as told by your doctor.  Be more active. Try gardening, walking, and taking the stairs.  Ask your doctor about ways that you can be more active.  Medicine  Take over-the-counter and prescription medicines only as told by your doctor. This information is not intended to replace advice given to you by your health care provider. Make sure you discuss any questions you have with your health care provider. Document Released: 04/03/2008 Document Revised: 08/07/2015 Document Reviewed: 07/18/2015 Elsevier Interactive Patient Education  2018 Elsevier Inc.  

## 2017-12-15 NOTE — Assessment & Plan Note (Signed)
Stable Continue prilosec as needed F/U for new, worsening symptoms

## 2017-12-15 NOTE — Progress Notes (Signed)
Daniel Barber is a 67 y.o. male with the following history as recorded in EpicCare:  Patient Active Problem List   Diagnosis Date Noted  . Arthritis 12/14/2016  . Gastroesophageal reflux disease without esophagitis 12/14/2016  . Hemorrhoid 03/17/2016  . Acute upper respiratory infection 11/25/2015  . Medicare annual wellness visit, initial 06/11/2015  . Routine general medical examination at a health care facility 05/30/2014    Current Outpatient Medications  Medication Sig Dispense Refill  . naproxen sodium (ANAPROX) 220 MG tablet Take 220 mg by mouth 2 (two) times daily with a meal.    . omeprazole (PRILOSEC) 10 MG capsule Take 10 mg by mouth as needed.      No current facility-administered medications for this visit.     Allergies: Patient has no known allergies.  Past Medical History:  Diagnosis Date  . Arthritis   . COPD, mild ([place])   . External hemorrhoids    symptomatic  . GERD (gastroesophageal reflux disease)   . History of adenomatous polyp of colon followed by dr Watt Climes   tubular adenoma's 1995;  2000;  2006;  2016  . History of malignant melanoma of skin 2017   excision posterior neck (localized)  . History of prostate cancer followed by pcp   1999--  radical prostatectomy/  per pt last PSA less than 0.1 on May 2017  . Nocturia   . Wears glasses     Past Surgical History:  Procedure Laterality Date  . COLONOSCOPY  last one 07-27-2014  dr Watt Climes  . ELBOW SURGERY Right 1989  . HEMORRHOID SURGERY N/A 08/28/2016   Procedure: HEMORRHOIDECTOMY;  Surgeon: Clovis Riley, MD;  Location: [facility];  Service: General;  Laterality: N/A;  . KNEE ARTHROSCOPY Left 2002  approx.  Marland Kitchen MELANOMA EXCISION  2017   posterior neck (localized)  . PROSTATECTOMY  1999   dr Risa Grill    Family History  Problem Relation Age of Onset  . Colon cancer Mother   . Heart attack Father   . Healthy Maternal Grandmother   . Healthy Maternal Grandfather   . Healthy  Paternal Grandmother   . Healthy Paternal Grandfather     Social History   Tobacco Use  . Smoking status: Light Tobacco Smoker    Years: 0.00    Types: Cigars    Last attempt to quit: 08/18/2006    Years since quitting: 11.3  . Smokeless tobacco: Never Used  . Tobacco comment: smokes an occasional cigar 4-5 timees per year  Substance Use Topics  . Alcohol use: Yes    Comment: 2 DRINKS DAILY     Subjective:  Here today for routine follow up of chronic conditions, health maintenance. His cholesterol was last checked on 04/2017, elevated along with elevated ASCVD, he declined statin, tells me today that he would prefer to avoid statin and would like to wait to have cholesterol rechecked again  In 6 months. He has recently been on a cruise, not eating well, feels his cholesterol will remain elevated.  He is maintained on prilosec prn only for GERD,takes when he knows hell eat a fatty/fried/spicy meal, and does reports good control of heartburn symptoms with prilosec prn. He is also maintained on naproxen 220 - 2 pills q am- has been taking this for many years now for OA in hands, does report good control of OA with this dosage, has tried ibuprofen, tylenol in the past which did not help. He is feeling well today, looking forward  to holidays tomorrow, and does not have any complaints, no fevers, chills, syncope, chest pain, shortness of breath, cough, weakness, no abnormal bruising or bleeding.   ROS- See HPI  Objective:  Vitals:   12/15/17 0851  BP: 137/68  Pulse: (!) 56  Resp: 17  Temp: 97.9 F (36.6 C)  TempSrc: Oral  SpO2: 99%  Weight: 259 lb (117.5 kg)  Height: 6' (1.829 m)   HR stable   General: Well developed, well nourished, in no acute distress  Skin : Warm and dry.  Head: Normocephalic and atraumatic  Eyes: Sclera and conjunctiva clear; pupils round and reactive to light; extraocular movements intact  Oropharynx: Pink, supple. No suspicious lesions  Neck:  Supple Lungs: Respirations unlabored; without wheeze, rales, rhonchi  CVS exam: normal rate and regular rhythm, S1 and S2 normal.  Musculoskeletal: No deformities Extremities: No edema, cyanosis, clubbing  Vessels: Symmetric bilaterally  Neurologic: Alert and oriented; speech intact; face symmetrical; moves all extremities well; CNII-XII intact without focal deficit  Psychiatric: Normal mood and affect.   Assessment:  1. Arthritis   2. Gastroesophageal reflux disease without esophagitis   3. Hyperlipidemia, unspecified hyperlipidemia type     Plan:   05/17/17- CMET, CBC WNL Hx prostatectomy-no psa Reviewed/recommended annual screening exams, healthy lifestyle, additional information provided on AVS  Return in about 6 months (around 06/15/2018) for routine follow up, annual labs.  No orders of the defined types were placed in this encounter.   Requested Prescriptions    No prescriptions requested or ordered in this encounter

## 2017-12-15 NOTE — Assessment & Plan Note (Signed)
Discussed risks of long term use of NSAIDs- CV, GI, would like to continue naproxen at current dosage Declines labs today

## 2017-12-22 DIAGNOSIS — D485 Neoplasm of uncertain behavior of skin: Secondary | ICD-10-CM | POA: Diagnosis not present

## 2017-12-22 DIAGNOSIS — Z85820 Personal history of malignant melanoma of skin: Secondary | ICD-10-CM | POA: Diagnosis not present

## 2017-12-22 DIAGNOSIS — D045 Carcinoma in situ of skin of trunk: Secondary | ICD-10-CM | POA: Diagnosis not present

## 2018-02-22 DIAGNOSIS — Z85828 Personal history of other malignant neoplasm of skin: Secondary | ICD-10-CM | POA: Diagnosis not present

## 2018-02-22 DIAGNOSIS — B078 Other viral warts: Secondary | ICD-10-CM | POA: Diagnosis not present

## 2018-02-22 DIAGNOSIS — L570 Actinic keratosis: Secondary | ICD-10-CM | POA: Diagnosis not present

## 2018-02-22 DIAGNOSIS — Z85820 Personal history of malignant melanoma of skin: Secondary | ICD-10-CM | POA: Diagnosis not present

## 2018-06-17 ENCOUNTER — Ambulatory Visit: Payer: Medicare Other | Admitting: Nurse Practitioner

## 2018-07-20 DIAGNOSIS — Z6833 Body mass index (BMI) 33.0-33.9, adult: Secondary | ICD-10-CM | POA: Diagnosis not present

## 2018-07-20 DIAGNOSIS — Z20828 Contact with and (suspected) exposure to other viral communicable diseases: Secondary | ICD-10-CM | POA: Diagnosis not present

## 2018-09-13 DIAGNOSIS — H21541 Posterior synechiae (iris), right eye: Secondary | ICD-10-CM | POA: Diagnosis not present

## 2018-09-13 DIAGNOSIS — H40033 Anatomical narrow angle, bilateral: Secondary | ICD-10-CM | POA: Diagnosis not present

## 2018-09-13 DIAGNOSIS — H2513 Age-related nuclear cataract, bilateral: Secondary | ICD-10-CM | POA: Diagnosis not present

## 2018-10-10 ENCOUNTER — Encounter: Payer: Medicare Other | Admitting: Nurse Practitioner

## 2018-10-10 ENCOUNTER — Telehealth: Payer: Self-pay

## 2018-10-10 NOTE — Telephone Encounter (Signed)
I called and left message on patient voicemail to call office and reschedule. Dr. Bryan Lemma will not be in the office 10/12/2018 appointment needs to be rescheduled.

## 2018-10-12 ENCOUNTER — Encounter: Payer: Medicare Other | Admitting: Family Medicine

## 2018-10-18 ENCOUNTER — Telehealth: Payer: Self-pay

## 2018-10-18 NOTE — Telephone Encounter (Signed)

## 2018-10-19 ENCOUNTER — Encounter: Payer: Self-pay | Admitting: Family Medicine

## 2018-10-19 ENCOUNTER — Ambulatory Visit (INDEPENDENT_AMBULATORY_CARE_PROVIDER_SITE_OTHER): Payer: Medicare Other | Admitting: Family Medicine

## 2018-10-19 VITALS — BP 120/80 | HR 60 | Wt 242.2 lb

## 2018-10-19 DIAGNOSIS — Z6832 Body mass index (BMI) 32.0-32.9, adult: Secondary | ICD-10-CM | POA: Diagnosis not present

## 2018-10-19 DIAGNOSIS — Z23 Encounter for immunization: Secondary | ICD-10-CM | POA: Diagnosis not present

## 2018-10-19 DIAGNOSIS — K219 Gastro-esophageal reflux disease without esophagitis: Secondary | ICD-10-CM | POA: Diagnosis not present

## 2018-10-19 DIAGNOSIS — E785 Hyperlipidemia, unspecified: Secondary | ICD-10-CM | POA: Diagnosis not present

## 2018-10-19 NOTE — Progress Notes (Signed)
Daniel Barber is a 68 y.o. male  Chief Complaint  Patient presents with  . Establish Care    HPI: Daniel Barber is a 68 y.o. male here to establish care with our office. Previous PCP was [employer] at [facility], last seen 11/2017.  Pt notes a h/o arthritis for which he takes aleve PRN. His symptoms are overall controlled, at baseline. He also has GERD, takes PRN prilosec. He is due for labs but is not fasting so will RTO for fasting lab appt next wk.   Last colonoscopy: 07/2014 - [facility] - due in 2026  Med refills needed today: none  Pt denies HA, dizziness, vision changes, n/v/d/c, CP, SOB, LE edema, depression, anxiety.  Past Medical History:  Diagnosis Date  . Arthritis   . COPD, mild ([place])   . External hemorrhoids    symptomatic  . GERD (gastroesophageal reflux disease)   . History of adenomatous polyp of colon followed by dr Watt Climes   tubular adenoma's 1995;  2000;  2006;  2016  . History of malignant melanoma of skin 2017   excision posterior neck (localized)  . History of prostate cancer followed by pcp   1999--  radical prostatectomy/  per pt last PSA less than 0.1 on May 2017  . Nocturia   . Wears glasses     Past Surgical History:  Procedure Laterality Date  . COLONOSCOPY  last one 07-27-2014  dr Watt Climes  . ELBOW SURGERY Right 1989  . HEMORRHOID SURGERY N/A 08/28/2016   Procedure: HEMORRHOIDECTOMY;  Surgeon: Clovis Riley, MD;  Location: [facility];  Service: General;  Laterality: N/A;  . KNEE ARTHROSCOPY Left 2002  approx.  Marland Kitchen MELANOMA EXCISION  2017   posterior neck (localized)  . PROSTATECTOMY  1999   dr Risa Grill    Social History   Socioeconomic History  . Marital status: Married    Spouse name: Not on file  . Number of children: 2  . Years of education: 21  . Highest education level: Not on file  Occupational History  . Occupation: Financial risk analyst  Social Needs  . Financial resource strain: Not hard at all  . Food  insecurity    Worry: Never true    Inability: Never true  . Transportation needs    Medical: No    Non-medical: No  Tobacco Use  . Smoking status: Light Tobacco Smoker    Years: 0.00    Types: Cigars    Last attempt to quit: 08/18/2006    Years since quitting: 12.1  . Smokeless tobacco: Never Used  . Tobacco comment: smokes an occasional cigar 4-5 timees per year  Substance and Sexual Activity  . Alcohol use: Yes    Comment: 2 DRINKS DAILY  . Drug use: No  . Sexual activity: Yes  Lifestyle  . Physical activity    Days per week: 0 days    Minutes per session: 0 min  . Stress: Not on file  Relationships  . Social connections    Talks on phone: More than three times a week    Gets together: More than three times a week    Attends religious service: More than 4 times per year    Active member of club or organization: Yes    Attends meetings of clubs or organizations: More than 4 times per year    Relationship status: Married  . Intimate partner violence    Fear of current or ex partner: No  Emotionally abused: No    Physically abused: No    Forced sexual activity: No  Other Topics Concern  . Not on file  Social History Narrative   Fun: Fish, shooting, antique cars   Denies religious beliefs effecting health care.     Family History  Problem Relation Age of Onset  . Colon cancer Mother   . Heart attack Father   . Healthy Maternal Grandmother   . Healthy Maternal Grandfather   . Healthy Paternal Grandmother   . Healthy Paternal Grandfather      Immunization History  Administered Date(s) Administered  . Fluad Quad(high Dose 65+) 10/19/2018  . Influenza, High Dose Seasonal PF 12/05/2014, 11/25/2015, 10/30/2016, 12/15/2017  . Pneumococcal Conjugate-13 12/19/2014  . Pneumococcal Polysaccharide-23 06/18/2016  . Td 05/30/2014  . Zoster 05/30/2014    Outpatient Encounter Medications as of 10/19/2018  Medication Sig  . naproxen sodium (ANAPROX) 220 MG tablet Take  220 mg by mouth 2 (two) times daily with a meal.  . omeprazole (PRILOSEC) 10 MG capsule Take 10 mg by mouth as needed.    No facility-administered encounter medications on file as of 10/19/2018.      ROS: Pertinent positives and negatives noted in HPI. Remainder of ROS non-contributory    No Known Allergies  BP 120/80   Pulse 60   Wt 242 lb 3.2 oz (109.9 kg)   SpO2 96%   BMI 32.85 kg/m   Physical Exam  Constitutional: He is oriented to person, place, and time. He appears well-developed and well-nourished. No distress.  Neck: Neck supple. No thyromegaly present.  Cardiovascular: Normal rate and regular rhythm.  Pulmonary/Chest: Effort normal and breath sounds normal. No respiratory distress.  Abdominal: Soft. Bowel sounds are normal. He exhibits no distension. There is no abdominal tenderness.  Musculoskeletal:        General: No edema.  Lymphadenopathy:    He has no cervical adenopathy.  Neurological: He is alert and oriented to person, place, and time.  Psychiatric: He has a normal mood and affect. His behavior is normal.     A/P:  1. Need for influenza vaccination - Flu Vaccine QUAD High Dose(Fluad)  2. Gastroesophageal reflux disease without esophagitis - controlled with PRN prilosec - Basic metabolic panel; Future  3. Hyperlipidemia, unspecified hyperlipidemia type - pt has declined statin in the past - AST; Future - ALT; Future - Lipid panel; Future  4. BMI 32.0-32.9,adult - discussed importance of regular CV exercise, healthy diet, adequate sleep

## 2018-10-25 ENCOUNTER — Telehealth: Payer: Self-pay

## 2018-10-25 NOTE — Telephone Encounter (Signed)

## 2018-10-26 ENCOUNTER — Other Ambulatory Visit (INDEPENDENT_AMBULATORY_CARE_PROVIDER_SITE_OTHER): Payer: Medicare Other

## 2018-10-26 DIAGNOSIS — K219 Gastro-esophageal reflux disease without esophagitis: Secondary | ICD-10-CM | POA: Diagnosis not present

## 2018-10-26 DIAGNOSIS — E785 Hyperlipidemia, unspecified: Secondary | ICD-10-CM

## 2018-10-26 LAB — BASIC METABOLIC PANEL
BUN: 12 mg/dL (ref 6–23)
CO2: 26 mEq/L (ref 19–32)
Calcium: 9.2 mg/dL (ref 8.4–10.5)
Chloride: 102 mEq/L (ref 96–112)
Creatinine, Ser: 1.1 mg/dL (ref 0.40–1.50)
GFR: 66.32 mL/min (ref >60.00)
Glucose, Bld: 93 mg/dL (ref 70–99)
Potassium: 4.4 mEq/L (ref 3.5–5.1)
Sodium: 136 mEq/L (ref 135–145)

## 2018-10-26 LAB — LIPID PANEL
Cholesterol: 164 mg/dL (ref 0–200)
HDL: 42.9 mg/dL (ref >39.00)
LDL Cholesterol: 94 mg/dL (ref 0–99)
NonHDL: 121.12
Total CHOL/HDL Ratio: 4
Triglycerides: 135 mg/dL (ref 0.0–149.0)
VLDL: 27 mg/dL (ref 0.0–40.0)

## 2018-10-26 LAB — ALT: ALT: 25 U/L (ref 0–53)

## 2018-10-26 LAB — AST: AST: 34 U/L (ref 0–37)

## 2018-10-27 ENCOUNTER — Encounter: Payer: Self-pay | Admitting: Family Medicine

## 2018-11-30 ENCOUNTER — Other Ambulatory Visit: Payer: Self-pay

## 2018-11-30 DIAGNOSIS — Z20828 Contact with and (suspected) exposure to other viral communicable diseases: Secondary | ICD-10-CM | POA: Diagnosis not present

## 2018-11-30 DIAGNOSIS — Z20822 Contact with and (suspected) exposure to covid-19: Secondary | ICD-10-CM

## 2018-12-03 LAB — NOVEL CORONAVIRUS, NAA: SARS-CoV-2, NAA: NOT DETECTED

## 2019-01-10 ENCOUNTER — Encounter: Payer: Self-pay | Admitting: Family Medicine

## 2019-02-23 DIAGNOSIS — L570 Actinic keratosis: Secondary | ICD-10-CM | POA: Diagnosis not present

## 2019-02-23 DIAGNOSIS — L821 Other seborrheic keratosis: Secondary | ICD-10-CM | POA: Diagnosis not present

## 2019-02-23 DIAGNOSIS — Z85828 Personal history of other malignant neoplasm of skin: Secondary | ICD-10-CM | POA: Diagnosis not present

## 2019-02-23 DIAGNOSIS — Z85820 Personal history of malignant melanoma of skin: Secondary | ICD-10-CM | POA: Diagnosis not present

## 2019-03-02 ENCOUNTER — Ambulatory Visit: Payer: Self-pay

## 2019-09-13 DIAGNOSIS — H2513 Age-related nuclear cataract, bilateral: Secondary | ICD-10-CM | POA: Diagnosis not present

## 2019-09-13 DIAGNOSIS — H40033 Anatomical narrow angle, bilateral: Secondary | ICD-10-CM | POA: Diagnosis not present

## 2019-09-13 DIAGNOSIS — H21541 Posterior synechiae (iris), right eye: Secondary | ICD-10-CM | POA: Diagnosis not present

## 2019-11-06 ENCOUNTER — Other Ambulatory Visit: Payer: Self-pay

## 2019-11-07 ENCOUNTER — Ambulatory Visit (INDEPENDENT_AMBULATORY_CARE_PROVIDER_SITE_OTHER): Payer: Medicare Other

## 2019-11-07 VITALS — BP 134/86 | HR 53 | Temp 97.7°F | Resp 16 | Ht 72.0 in | Wt 254.6 lb

## 2019-11-07 DIAGNOSIS — Z0000 Encounter for general adult medical examination without abnormal findings: Secondary | ICD-10-CM | POA: Diagnosis not present

## 2019-11-07 DIAGNOSIS — Z23 Encounter for immunization: Secondary | ICD-10-CM | POA: Diagnosis not present

## 2019-11-07 NOTE — Progress Notes (Signed)
Subjective:   Daniel Barber is a 69 y.o. male who presents for Medicare Annual/Subsequent preventive examination.  Review of Systems    Cardiac Risk Factors include: advanced age (>40men, >71 women);male gender;dyslipidemia;obesity (BMI >30kg/m2)     Objective:    Today's Vitals   11/07/19 0933 11/07/19 0936  BP: 134/86   Pulse: (!) 53   Resp: 16   Temp: 97.7 F (36.5 C)   TempSrc: Temporal   SpO2: 97%   Weight: 254 lb 9.6 oz (115.5 kg)   Height: 6' (1.829 m)   PainSc:  2    Body mass index is 34.53 kg/m.  Advanced Directives 11/07/2019 12/15/2017 08/28/2016  Does Patient Have a Medical Advance Directive? Yes No No  Type of Paramedic of [place];Living will - -  Copy of [place] in Chart? No - copy requested - -  Would patient like information on creating a medical advance directive? - No - Patient declined Yes (MAU/Ambulatory/Procedural Areas - Information given)    Current Medications (verified) Outpatient Encounter Medications as of 11/07/2019  Medication Sig  . naproxen sodium (ANAPROX) 220 MG tablet Take 220 mg by mouth 2 (two) times daily with a meal.  . omeprazole (PRILOSEC) 10 MG capsule Take 10 mg by mouth as needed.    No facility-administered encounter medications on file as of 11/07/2019.    Allergies (verified) Patient has no known allergies.   History: Past Medical History:  Diagnosis Date  . Arthritis   . COPD, mild ([place])   . External hemorrhoids    symptomatic  . GERD (gastroesophageal reflux disease)   . History of adenomatous polyp of colon followed by dr Watt Climes   tubular adenoma's 1995;  2000;  2006;  2016  . History of malignant melanoma of skin 2017   excision posterior neck (localized)  . History of prostate cancer followed by pcp   1999--  radical prostatectomy/  per pt last PSA less than 0.1 on May 2017  . Nocturia   . Wears glasses    Past Surgical History:  Procedure Laterality  Date  . COLONOSCOPY  last one 07-27-2014  dr Watt Climes  . ELBOW SURGERY Right 1989  . HEMORRHOID SURGERY N/A 08/28/2016   Procedure: HEMORRHOIDECTOMY;  Surgeon: Clovis Riley, MD;  Location: [facility];  Service: General;  Laterality: N/A;  . KNEE ARTHROSCOPY Left 2002  approx.  Marland Kitchen MELANOMA EXCISION  2017   posterior neck (localized)  . PROSTATECTOMY  1999   dr Risa Grill   Family History  Problem Relation Age of Onset  . Colon cancer Mother   . Heart attack Father   . Healthy Maternal Grandmother   . Healthy Maternal Grandfather   . Healthy Paternal Grandmother   . Healthy Paternal Grandfather    Social History   Socioeconomic History  . Marital status: Married    Spouse name: Not on file  . Number of children: 2  . Years of education: 34  . Highest education level: Not on file  Occupational History  . Occupation: Financial risk analyst  Tobacco Use  . Smoking status: Light Tobacco Smoker    Years: 0.00    Types: Cigars    Last attempt to quit: 08/18/2006    Years since quitting: 13.2  . Smokeless tobacco: Never Used  . Tobacco comment: smokes an occasional cigar 4-5 timees per year  Vaping Use  . Vaping Use: Never used  Substance and Sexual Activity  .  Alcohol use: Yes    Comment: 2 DRINKS DAILY  . Drug use: No  . Sexual activity: Yes  Other Topics Concern  . Not on file  Social History Narrative   Fun: Fish, shooting, antique cars   Denies religious beliefs effecting health care.    Social Determinants of Health   Financial Resource Strain: Low Risk   . Difficulty of Paying Living Expenses: Not hard at all  Food Insecurity: No Food Insecurity  . Worried About Charity fundraiser in the Last Year: Never true  . Ran Out of Food in the Last Year: Never true  Transportation Needs: No Transportation Needs  . Lack of Transportation (Medical): No  . Lack of Transportation (Non-Medical): No  Physical Activity: Sufficiently Active  . Days of Exercise per  Week: 7 days  . Minutes of Exercise per Session: 30 min  Stress: No Stress Concern Present  . Feeling of Stress : Not at all  Social Connections: Moderately Integrated  . Frequency of Communication with Friends and Family: More than three times a week  . Frequency of Social Gatherings with Friends and Family: Once a week  . Attends Religious Services: Never  . Active Member of Clubs or Organizations: Yes  . Attends Archivist Meetings: More than 4 times per year  . Marital Status: Married    Tobacco Counseling Ready to quit: Not Answered Counseling given: Not Answered Comment: smokes an occasional cigar 4-5 timees per year   Clinical Intake:  Pre-visit preparation completed: Yes  Pain : 0-10 Pain Score: 2  Pain Type: Chronic pain (Arthritis) Pain Location: Generalized Pain Onset: More than a month ago Pain Frequency: Constant     Nutritional Status: BMI > 30  Obese Nutritional Risks: None Diabetes: No  How often do you need to have someone help you when you read instructions, pamphlets, or other written materials from your doctor or pharmacy?: 1 - Never What is the last grade level you completed in school?: Post Grad  Diabetic?No  Interpreter Needed?: No  Information entered by :: Caroleen Hamman LPN   Activities of Daily Living In your present state of health, do you have any difficulty performing the following activities: 11/07/2019  Hearing? N  Vision? N  Difficulty concentrating or making decisions? N  Walking or climbing stairs? N  Dressing or bathing? N  Doing errands, shopping? N  Preparing Food and eating ? N  Using the Toilet? N  In the past six months, have you accidently leaked urine? N  Do you have problems with loss of bowel control? N  Managing your Medications? N  Managing your Finances? N  Housekeeping or managing your Housekeeping? N  Some recent data might be hidden    Patient Care Team: Ronnald Nian, DO as PCP -  General (Family Medicine)  Indicate any recent Medical Services you may have received from other than [facility] providers in the past year (date may be approximate).     Assessment:   This is a routine wellness examination for Daniel Barber.  Hearing/Vision screen  Hearing Screening   125Hz  250Hz  500Hz  1000Hz  2000Hz  3000Hz  4000Hz  6000Hz  8000Hz   Right ear:           Left ear:           Comments: No issues  Vision Screening Comments: Wears glasses Last eye exam-08/2019-Dr. Groat  Dietary issues and exercise activities discussed: Current Exercise Habits: Home exercise routine, Type of exercise: Other - see comments (yard  work), Time (Minutes): 30, Frequency (Times/Week): 7, Weekly Exercise (Minutes/Week): 210, Intensity: Mild, Exercise limited by: None identified  Goals    . Patient Stated     I want to get back into the Silver Sneakers program and start to exercise in the [facility].      Depression Screen PHQ 2/9 Scores 11/07/2019 12/15/2017 06/11/2015  PHQ - 2 Score 0 0 0    Fall Risk Fall Risk  11/07/2019 12/15/2017 06/11/2015  Falls in the past year? 0 0 Yes  Number falls in past yr: 0 - 1  Injury with Fall? 0 - No  Risk Factor Category  - - (No Data)  Comment - - Fell while working.   Follow up Falls prevention discussed - -    Any stairs in or around the home? No  Home free of loose throw rugs in walkways, pet beds, electrical cords, etc? Yes  Adequate lighting in your home to reduce risk of falls? Yes   ASSISTIVE DEVICES UTILIZED TO PREVENT FALLS:  Life alert? No  Use of a cane, walker or w/c? No  Grab bars in the bathroom? No  Shower chair or bench in shower? No  Elevated toilet seat or a handicapped toilet? No   TIMED UP AND GO:  Was the test performed? Yes .  Length of time to ambulate 10 feet: 9 sec.   Gait steady and fast without use of assistive device  Cognitive Function:No cognitive impairment noted.     6CIT Screen 11/07/2019  What Year? 0 points  What  month? 0 points  What time? 0 points  Count back from 20 0 points  Months in reverse 0 points  Repeat phrase 0 points  Total Score 0    Immunizations Immunization History  Administered Date(s) Administered  . Fluad Quad(high Dose 65+) 10/19/2018, 11/07/2019  . Influenza, High Dose Seasonal PF 12/05/2014, 11/25/2015, 10/30/2016, 12/15/2017  . Moderna SARS-COVID-2 Vaccination 02/24/2019, 03/29/2019  . Pneumococcal Conjugate-13 12/19/2014  . Pneumococcal Polysaccharide-23 06/18/2016  . Td 05/30/2014  . Zoster 05/30/2014    TDAP status: Up to date   Flu Vaccine status: Up to date   Pneumococcal vaccine status: Up to date   Covid-19 vaccine status: Completed vaccines  Qualifies for Shingles Vaccine? Yes   Zostavax completed Yes   Shingrix Completed?: No.    Education has been provided regarding the importance of this vaccine. Patient has been advised to call insurance company to determine out of pocket expense if they have not yet received this vaccine. Advised may also receive vaccine at local pharmacy or Health Dept. Verbalized acceptance and understanding.  Screening Tests Health Maintenance  Topic Date Due  . TETANUS/TDAP  05/29/2024  . COLONOSCOPY  08/05/2024  . INFLUENZA VACCINE  Completed  . COVID-19 Vaccine  Completed  . Hepatitis C Screening  Completed  . PNA vac Low Risk Adult  Completed    Health Maintenance  There are no preventive care reminders to display for this patient.  Colorectal cancer screening: Completed 08/06/2014. Repeat every 10 years  Lung Cancer Screening: (Low Dose CT Chest recommended if Age 56-80 years, 30 pack-year currently smoking OR have quit w/in 15years.) does not qualify.     Additional Screening:  Hepatitis C Screening:Completed 12/05/2014  Vision Screening: Recommended annual ophthalmology exams for early detection of glaucoma and other disorders of the eye. Is the patient up to date with their annual eye exam?  Yes  Who is  the provider or what is the name  of the office in which the patient attends annual eye exams? Dr. Katy Fitch   Dental Screening: Recommended annual dental exams for proper oral hygiene  Community Resource Referral / Chronic Care Management: CRR required this visit?  No   CCM required this visit?  No      Plan:     I have personally reviewed and noted the following in the patient's chart:   . Medical and social history . Use of alcohol, tobacco or illicit drugs  . Current medications and supplements . Functional ability and status . Nutritional status . Physical activity . Advanced directives . List of other physicians . Hospitalizations, surgeries, and ER visits in previous 12 months . Vitals . Screenings to include cognitive, depression, and falls . Referrals and appointments  In addition, I have reviewed and discussed with patient certain preventive protocols, quality metrics, and best practice recommendations. A written personalized care plan for preventive services as well as general preventive health recommendations were provided to patient.    Patient to access AVS on mychart.   Marta Antu, LPN   01/21/7251  Nurse Health Advisor  Nurse Notes: None

## 2019-11-07 NOTE — Patient Instructions (Signed)
Health Maintenance Due  Topic Date Due  . COVID-19 Vaccine (1) Never done  . INFLUENZA VACCINE  08/20/2019    Depression screen [facility] 2/9 12/15/2017 06/11/2015  Decreased Interest 0 0  Down, Depressed, Hopeless 0 0  PHQ - 2 Score 0 0

## 2019-11-07 NOTE — Patient Instructions (Signed)
Daniel Barber , Thank you for taking time to complete your Medicare Wellness Visit. I appreciate your ongoing commitment to your health goals. Please review the following plan we discussed and let me know if I can assist you in the future.   Screening recommendations/referrals: Colonoscopy: Completed 08/06/2014- Scheduled for 01/2020 Recommended yearly ophthalmology/optometry visit for glaucoma screening and checkup Recommended yearly dental visit for hygiene and checkup  Vaccinations: Influenza vaccine: Up to Date Pneumococcal vaccine: Completed vaccines Tdap vaccine:Up to Date-Due-05/29/2024 Shingles vaccine: Discuss with pharmacy  Covid-19: Completed vaccines  Advanced directives: Please bring a copy for your chart  Conditions/risks identified: See problem list  Next appointment: Follow up in one year for your annual wellness visit. 11/12/2020 @ 9:45am  Preventive Care 70 Years and Older, Male Preventive care refers to lifestyle choices and visits with your health care provider that can promote health and wellness. What does preventive care include?  A yearly physical exam. This is also called an annual well check.  Dental exams once or twice a year.  Routine eye exams. Ask your health care provider how often you should have your eyes checked.  Personal lifestyle choices, including:  Daily care of your teeth and gums.  Regular physical activity.  Eating a healthy diet.  Avoiding tobacco and drug use.  Limiting alcohol use.  Practicing safe sex.  Taking low doses of aspirin every day.  Taking vitamin and mineral supplements as recommended by your health care provider. What happens during an annual well check? The services and screenings done by your health care provider during your annual well check will depend on your age, overall health, lifestyle risk factors, and family history of disease. Counseling  Your health care provider may ask you questions about  your:  Alcohol use.  Tobacco use.  Drug use.  Emotional well-being.  Home and relationship well-being.  Sexual activity.  Eating habits.  History of falls.  Memory and ability to understand (cognition).  Work and work Statistician. Screening  You may have the following tests or measurements:  Height, weight, and BMI.  Blood pressure.  Lipid and cholesterol levels. These may be checked every 5 years, or more frequently if you are over 33 years old.  Skin check.  Lung cancer screening. You may have this screening every year starting at age 26 if you have a 30-pack-year history of smoking and currently smoke or have quit within the past 15 years.  Fecal occult blood test (FOBT) of the stool. You may have this test every year starting at age 12.  Flexible sigmoidoscopy or colonoscopy. You may have a sigmoidoscopy every 5 years or a colonoscopy every 10 years starting at age 30.  Prostate cancer screening. Recommendations will vary depending on your family history and other risks.  Hepatitis C blood test.  Hepatitis B blood test.  Sexually transmitted disease (STD) testing.  Diabetes screening. This is done by checking your blood sugar (glucose) after you have not eaten for a while (fasting). You may have this done every 1-3 years.  Abdominal aortic aneurysm (AAA) screening. You may need this if you are a current or former smoker.  Osteoporosis. You may be screened starting at age 54 if you are at high risk. Talk with your health care provider about your test results, treatment options, and if necessary, the need for more tests. Vaccines  Your health care provider may recommend certain vaccines, such as:  Influenza vaccine. This is recommended every year.  Tetanus, diphtheria, and acellular  pertussis (Tdap, Td) vaccine. You may need a Td booster every 10 years.  Zoster vaccine. You may need this after age 74.  Pneumococcal 13-valent conjugate (PCV13) vaccine.  One dose is recommended after age 38.  Pneumococcal polysaccharide (PPSV23) vaccine. One dose is recommended after age 58. Talk to your health care provider about which screenings and vaccines you need and how often you need them. This information is not intended to replace advice given to you by your health care provider. Make sure you discuss any questions you have with your health care provider. Document Released: 02/01/2015 Document Revised: 09/25/2015 Document Reviewed: 11/06/2014 Elsevier Interactive Patient Education  2017 [employer] Prevention in the Home Falls can cause injuries. They can happen to people of all ages. There are many things you can do to make your home safe and to help prevent falls. What can I do on the outside of my home?  Regularly fix the edges of walkways and driveways and fix any cracks.  Remove anything that might make you trip as you walk through a door, such as a raised step or threshold.  Trim any bushes or trees on the path to your home.  Use bright outdoor lighting.  Clear any walking paths of anything that might make someone trip, such as rocks or tools.  Regularly check to see if handrails are loose or broken. Make sure that both sides of any steps have handrails.  Any raised decks and porches should have guardrails on the edges.  Have any leaves, snow, or ice cleared regularly.  Use sand or salt on walking paths during winter.  Clean up any spills in your garage right away. This includes oil or grease spills. What can I do in the bathroom?  Use night lights.  Install grab bars by the toilet and in the tub and shower. Do not use towel bars as grab bars.  Use non-skid mats or decals in the tub or shower.  If you need to sit down in the shower, use a plastic, non-slip stool.  Keep the floor dry. Clean up any water that spills on the floor as soon as it happens.  Remove soap buildup in the tub or shower regularly.  Attach bath  mats securely with double-sided non-slip rug tape.  Do not have throw rugs and other things on the floor that can make you trip. What can I do in the bedroom?  Use night lights.  Make sure that you have a light by your bed that is easy to reach.  Do not use any sheets or blankets that are too big for your bed. They should not hang down onto the floor.  Have a firm chair that has side arms. You can use this for support while you get dressed.  Do not have throw rugs and other things on the floor that can make you trip. What can I do in the kitchen?  Clean up any spills right away.  Avoid walking on wet floors.  Keep items that you use a lot in easy-to-reach places.  If you need to reach something above you, use a strong step stool that has a grab bar.  Keep electrical cords out of the way.  Do not use floor polish or wax that makes floors slippery. If you must use wax, use non-skid floor wax.  Do not have throw rugs and other things on the floor that can make you trip. What can I do with my stairs?  Do not leave any items on the stairs.  Make sure that there are handrails on both sides of the stairs and use them. Fix handrails that are broken or loose. Make sure that handrails are as long as the stairways.  Check any carpeting to make sure that it is firmly attached to the stairs. Fix any carpet that is loose or worn.  Avoid having throw rugs at the top or bottom of the stairs. If you do have throw rugs, attach them to the floor with carpet tape.  Make sure that you have a light switch at the top of the stairs and the bottom of the stairs. If you do not have them, ask someone to add them for you. What else can I do to help prevent falls?  Wear shoes that:  Do not have high heels.  Have rubber bottoms.  Are comfortable and fit you well.  Are closed at the toe. Do not wear sandals.  If you use a stepladder:  Make sure that it is fully opened. Do not climb a closed  stepladder.  Make sure that both sides of the stepladder are locked into place.  Ask someone to hold it for you, if possible.  Clearly mark and make sure that you can see:  Any grab bars or handrails.  First and last steps.  Where the edge of each step is.  Use tools that help you move around (mobility aids) if they are needed. These include:  Canes.  Walkers.  Scooters.  Crutches.  Turn on the lights when you go into a dark area. Replace any light bulbs as soon as they burn out.  Set up your furniture so you have a clear path. Avoid moving your furniture around.  If any of your floors are uneven, fix them.  If there are any pets around you, be aware of where they are.  Review your medicines with your doctor. Some medicines can make you feel dizzy. This can increase your chance of falling. Ask your doctor what other things that you can do to help prevent falls. This information is not intended to replace advice given to you by your health care provider. Make sure you discuss any questions you have with your health care provider. Document Released: 11/01/2008 Document Revised: 06/13/2015 Document Reviewed: 02/09/2014 Elsevier Interactive Patient Education  2017 [employer].

## 2019-11-07 NOTE — Progress Notes (Signed)
Per orders of Dr Bryan Lemma, injection of influenza given by [facility], cma.  Patient tolerated injection well.

## 2019-11-15 DIAGNOSIS — Z23 Encounter for immunization: Secondary | ICD-10-CM | POA: Diagnosis not present

## 2020-02-27 DIAGNOSIS — D225 Melanocytic nevi of trunk: Secondary | ICD-10-CM | POA: Diagnosis not present

## 2020-02-27 DIAGNOSIS — D692 Other nonthrombocytopenic purpura: Secondary | ICD-10-CM | POA: Diagnosis not present

## 2020-02-27 DIAGNOSIS — L814 Other melanin hyperpigmentation: Secondary | ICD-10-CM | POA: Diagnosis not present

## 2020-02-27 DIAGNOSIS — Z85828 Personal history of other malignant neoplasm of skin: Secondary | ICD-10-CM | POA: Diagnosis not present

## 2020-02-27 DIAGNOSIS — Z85820 Personal history of malignant melanoma of skin: Secondary | ICD-10-CM | POA: Diagnosis not present

## 2020-02-27 DIAGNOSIS — L738 Other specified follicular disorders: Secondary | ICD-10-CM | POA: Diagnosis not present

## 2020-02-27 DIAGNOSIS — L821 Other seborrheic keratosis: Secondary | ICD-10-CM | POA: Diagnosis not present

## 2020-02-27 DIAGNOSIS — L570 Actinic keratosis: Secondary | ICD-10-CM | POA: Diagnosis not present

## 2020-03-28 DIAGNOSIS — Z01812 Encounter for preprocedural laboratory examination: Secondary | ICD-10-CM | POA: Diagnosis not present

## 2020-04-02 DIAGNOSIS — K5730 Diverticulosis of large intestine without perforation or abscess without bleeding: Secondary | ICD-10-CM | POA: Diagnosis not present

## 2020-04-02 DIAGNOSIS — Z800 Family history of malignant neoplasm of digestive organs: Secondary | ICD-10-CM | POA: Diagnosis not present

## 2020-04-02 DIAGNOSIS — D122 Benign neoplasm of ascending colon: Secondary | ICD-10-CM | POA: Diagnosis not present

## 2020-04-02 DIAGNOSIS — Z86010 Personal history of colonic polyps: Secondary | ICD-10-CM | POA: Diagnosis not present

## 2020-04-05 DIAGNOSIS — D122 Benign neoplasm of ascending colon: Secondary | ICD-10-CM | POA: Diagnosis not present

## 2020-07-11 DIAGNOSIS — Z23 Encounter for immunization: Secondary | ICD-10-CM | POA: Diagnosis not present

## 2020-07-31 ENCOUNTER — Encounter: Payer: Self-pay | Admitting: Family Medicine

## 2020-09-02 DIAGNOSIS — Z9889 Other specified postprocedural states: Secondary | ICD-10-CM | POA: Insufficient documentation

## 2020-09-02 DIAGNOSIS — M25511 Pain in right shoulder: Secondary | ICD-10-CM | POA: Insufficient documentation

## 2020-09-05 ENCOUNTER — Other Ambulatory Visit: Payer: Self-pay

## 2020-09-05 ENCOUNTER — Encounter: Payer: Self-pay | Admitting: Physician Assistant

## 2020-09-05 ENCOUNTER — Ambulatory Visit (INDEPENDENT_AMBULATORY_CARE_PROVIDER_SITE_OTHER): Payer: Medicare Other | Admitting: Physician Assistant

## 2020-09-05 ENCOUNTER — Ambulatory Visit (INDEPENDENT_AMBULATORY_CARE_PROVIDER_SITE_OTHER): Payer: Medicare Other

## 2020-09-05 DIAGNOSIS — M25511 Pain in right shoulder: Secondary | ICD-10-CM

## 2020-09-05 MED ORDER — METHYLPREDNISOLONE ACETATE 40 MG/ML IJ SUSP
40.0000 mg | INTRAMUSCULAR | Status: AC | PRN
  Administered 2020-09-05: 40 mg via INTRA_ARTICULAR

## 2020-09-05 MED ORDER — LIDOCAINE HCL 1 % IJ SOLN
3.0000 mL | INTRAMUSCULAR | Status: AC | PRN
  Administered 2020-09-05: 3 mL

## 2020-09-05 NOTE — Progress Notes (Signed)
Office Visit Note   Patient: Daniel Barber           Date of Birth: [birth year]           MRN: [id number] Visit Date: 09/05/2020              Requested by: Ronnald Nian, DO No address on file PCP: No primary care provider on file.   Assessment & Plan: Visit Diagnoses:  1. Acute pain of right shoulder     Plan: Patient tolerated the subacromial injection today.  I discussed with him that I feel that he is at least partially ruptured his long head of the biceps tendon.  He will work on gentle range of motion of the shoulder exercises were shown.  Like to see him back in just 2 weeks to see how he is doing overall.  Questions encouraged and answered at length.  Follow-Up Instructions: Return in about 2 weeks (around 09/19/2020).   Orders:  Orders Placed This Encounter  Procedures   Large Joint Inj: R subacromial bursa   XR Shoulder Right   No orders of the defined types were placed in this encounter.     Procedures: Large Joint Inj: R subacromial bursa on 09/05/2020 1:54 PM Indications: pain Details: 22 G 1.5 in needle, superior approach  Arthrogram: No  Medications: 3 mL lidocaine 1 %; 40 mg methylPREDNISolone acetate 40 MG/ML Outcome: tolerated well, no immediate complications Procedure, treatment alternatives, risks and benefits explained, specific risks discussed. Consent was given by the patient. Immediately prior to procedure a time out was called to verify the correct patient, procedure, equipment, support staff and site/side marked as required. Patient was prepped and draped in the usual sterile fashion.      Clinical Data: No additional findings.   Subjective: Chief Complaint  Patient presents with   Right Shoulder - Pain    HPI Daniel Barber is a pleasant 71 year old male who is being seen for the first time for right shoulder pain.  He reports yesterday around 330 he was trying to start a gas powered blower and had extreme pain in his right  shoulder.  He feels as though something popped.  Said decreased range of motion since then.  He has been taking Aleve.  He has had some in the right shoulder previously.  He notes that when flyfishing he is unable to do a long cast and has to do short side arm cast.  No numbness tingling down the arm.  Patient is nondiabetic. Review of Systems Negative for fevers or chills.  Objective: Vital Signs: There were no vitals taken for this visit.  Physical Exam Constitutional:      Appearance: He is not ill-appearing or diaphoretic.  Pulmonary:     Effort: Pulmonary effort is normal.  Neurological:     Mental Status: He is alert and oriented to person, place, and time.  Psychiatric:        Mood and Affect: Mood normal.        Thought Content: Thought content normal.    Ortho Exam External and internal rotation bilateral shoulders against resistance reveals 5 out of 5 strengths.  Empty can test is negative on the left positive on the right.  Impingement testing positive on the right negative on the left.  He has tenderness to the right biceps muscle belly and up into the bicipital groove region.  Slight distalization of the right biceps compared to the left.  He has decreased overhead  motion of the right shoulder actively and passively secondary to pain.  There is no ecchymosis erythema about either shoulder. Specialty Comments:  No specialty comments available.  Imaging: XR Shoulder Right  Result Date: 09/05/2020 Right shoulder 3 views: No acute fractures.  Y-view shows the shoulder to be well located in the subacromial space to be well-maintained.  No significant osteophyte seen.  The AP and axillary view show decreased joint space unsure if this is projectional though.    PMFS History: Patient Active Problem List   Diagnosis Date Noted   Hyperlipidemia 12/15/2017   Arthritis 12/14/2016   Gastroesophageal reflux disease without esophagitis 12/14/2016   Medicare annual wellness visit,  initial 06/11/2015   Routine general medical examination at a health care facility 05/30/2014   Past Medical History:  Diagnosis Date   Arthritis    COPD, mild ([place])    External hemorrhoids    symptomatic   GERD (gastroesophageal reflux disease)    History of adenomatous polyp of colon followed by dr Watt Climes   tubular adenoma's 1995;  2000;  2006;  2016   History of malignant melanoma of skin 2017   excision posterior neck (localized)   History of prostate cancer followed by pcp   1999--  radical prostatectomy/  per pt last PSA less than 0.1 on May 2017   Nocturia    Wears glasses     Family History  Problem Relation Age of Onset   Colon cancer Mother    Heart attack Father    Healthy Maternal Grandmother    Healthy Maternal Grandfather    Healthy Paternal Grandmother    Healthy Paternal Grandfather     Past Surgical History:  Procedure Laterality Date   COLONOSCOPY  last one 07-27-2014  dr Watt Climes   ELBOW SURGERY Right 1989   HEMORRHOID SURGERY N/A 08/28/2016   Procedure: HEMORRHOIDECTOMY;  Surgeon: Clovis Riley, MD;  Location: [place];  Service: General;  Laterality: N/A;   KNEE ARTHROSCOPY Left 2002  approx.   MELANOMA EXCISION  2017   posterior neck (localized)   PROSTATECTOMY  1999   dr Risa Grill   Social History   Occupational History   Occupation: Financial risk analyst  Tobacco Use   Smoking status: Light Smoker    Types: Cigars    Last attempt to quit: 08/18/2006    Years since quitting: 14.0   Smokeless tobacco: Never   Tobacco comments:    smokes an occasional cigar 4-5 timees per year  Vaping Use   Vaping Use: Never used  Substance and Sexual Activity   Alcohol use: Yes    Comment: 2 DRINKS DAILY   Drug use: No   Sexual activity: Yes

## 2020-09-16 DIAGNOSIS — H40033 Anatomical narrow angle, bilateral: Secondary | ICD-10-CM | POA: Diagnosis not present

## 2020-09-16 DIAGNOSIS — H2513 Age-related nuclear cataract, bilateral: Secondary | ICD-10-CM | POA: Diagnosis not present

## 2020-09-16 DIAGNOSIS — H21541 Posterior synechiae (iris), right eye: Secondary | ICD-10-CM | POA: Diagnosis not present

## 2020-09-19 ENCOUNTER — Ambulatory Visit: Payer: Medicare Other | Admitting: Orthopaedic Surgery

## 2020-10-08 ENCOUNTER — Encounter: Payer: Self-pay | Admitting: Family Medicine

## 2020-10-08 ENCOUNTER — Ambulatory Visit (INDEPENDENT_AMBULATORY_CARE_PROVIDER_SITE_OTHER): Payer: Medicare Other

## 2020-10-08 ENCOUNTER — Other Ambulatory Visit: Payer: Self-pay

## 2020-10-08 DIAGNOSIS — Z23 Encounter for immunization: Secondary | ICD-10-CM

## 2020-10-08 NOTE — Progress Notes (Addendum)
After obtaining consent from patient, influenza vaccine given by [facility], IM in the right deltoid. Patient instructed to remain in clinic for 10 minutes afterwards, and to report any adverse reaction to me immediately. Patient tolerated injection well.

## 2020-11-04 ENCOUNTER — Other Ambulatory Visit: Payer: Self-pay

## 2020-11-04 DIAGNOSIS — M25511 Pain in right shoulder: Secondary | ICD-10-CM

## 2020-11-04 NOTE — Telephone Encounter (Signed)
MRI ordered

## 2020-11-11 ENCOUNTER — Ambulatory Visit (INDEPENDENT_AMBULATORY_CARE_PROVIDER_SITE_OTHER): Payer: Medicare Other | Admitting: Family Medicine

## 2020-11-11 ENCOUNTER — Other Ambulatory Visit: Payer: Self-pay

## 2020-11-11 ENCOUNTER — Encounter: Payer: Self-pay | Admitting: Family Medicine

## 2020-11-11 VITALS — BP 144/86 | HR 60 | Temp 97.4°F | Ht 72.0 in | Wt 258.4 lb

## 2020-11-11 DIAGNOSIS — Z8546 Personal history of malignant neoplasm of prostate: Secondary | ICD-10-CM | POA: Insufficient documentation

## 2020-11-11 DIAGNOSIS — Z8639 Personal history of other endocrine, nutritional and metabolic disease: Secondary | ICD-10-CM | POA: Diagnosis not present

## 2020-11-11 DIAGNOSIS — K219 Gastro-esophageal reflux disease without esophagitis: Secondary | ICD-10-CM | POA: Diagnosis not present

## 2020-11-11 DIAGNOSIS — M1990 Unspecified osteoarthritis, unspecified site: Secondary | ICD-10-CM

## 2020-11-11 DIAGNOSIS — M25511 Pain in right shoulder: Secondary | ICD-10-CM | POA: Diagnosis not present

## 2020-11-11 DIAGNOSIS — G8929 Other chronic pain: Secondary | ICD-10-CM

## 2020-11-11 NOTE — Progress Notes (Signed)
[facility] [address] [place] [address] Dept: [phone] Dept Fax: [phone]  Transfer of Care Office Visit  Subjective:    Patient ID: Daniel Barber, male    DOB: May 12, 1950, 70 y.o..   MRN: [id number]  Chief Complaint  Patient presents with   Establish Care    [facility]-  establish care.   No concerns.   UTD on vaccines.    History of Present Illness:  Patient is in today to establish care. Daniel Barber was born in [place], [place]. He moved to [place] from [place], [place] in 1988 for work. He attended college at [employer] and has a degree in Engineer, production. 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 He has been married for 25 years to his 2nd wife. They have three children between them: a son who is a Building control surveyor in [place], a son in [place], and a daughter in [place], [place]. They also have 2 grandsons. Daniel Barber smokes a cigar about 4-5 times a year. He drinks 1-2 alcoholic drinks each day. He denies any drug use.   Daniel Barber has a history of GERD. He manages this with OTC Prilosec 10 mg. He notes he takes one if he knows he will be eating a trigger food (such as [place]) or if he has heartburn.  Daniel Barber has a history of bilateral hand arthritis. He uses naproxen for management.  Daniel Barber is currently followed by orthopedics for a right shoulder impingement syndrome. He is having an MRI scan of the shoulder this weekend. He recently had a steroid injection and noted improvement, but this only last a couple of weeks.  Daniel Barber has a past history of hyperlipidemia, but he notes this has improved.  Daniel Barber has a prior history of prostate cancer. He underwent prostatectomy and notes he has been fine since.  Past Medical History: Patient Active Problem List   Diagnosis Date Noted   History of prostate cancer 11/11/2020   Right shoulder pain 09/02/2020   History of  hyperlipidemia, mixed 12/15/2017   Arthritis 12/14/2016   Gastroesophageal reflux disease without esophagitis 12/14/2016   Past Surgical History:  Procedure Laterality Date   COLONOSCOPY  last one 07-27-2014  dr Watt Climes   ELBOW SURGERY Right 1989   HEMORRHOID SURGERY N/A 08/28/2016   Procedure: HEMORRHOIDECTOMY;  Surgeon: Clovis Riley, MD;  Location: [facility];  Service: General;  Laterality: N/A;   KNEE ARTHROSCOPY Left 2002  approx.   MELANOMA EXCISION  2017   posterior neck (localized)   PROSTATECTOMY  1999   dr Risa Grill   Family History  Problem Relation Age of Onset   Colon cancer Mother    Heart attack Father    Colon cancer Maternal Uncle    Outpatient Medications Prior to Visit  Medication Sig Dispense Refill   Ibuprofen 200 MG CAPS      naproxen sodium (ANAPROX) 220 MG tablet Take 220 mg by mouth 2 (two) times daily with a meal.     omeprazole (PRILOSEC) 10 MG capsule Take 10 mg by mouth as needed.      No facility-administered medications prior to visit.   No Known Allergies    Objective:   Today's Vitals   11/11/20 1455  BP: (!) 144/86  Pulse: 60  Temp: (!) 97.4 F (36.3 C)  TempSrc: Temporal  SpO2: 98%  Weight: 258 lb 6.4 oz (117.2 kg)  Height: 6' (1.829 m)  Body mass index is 35.05 kg/m.   General: Well developed, well nourished. No acute distress. Psych: Alert and oriented. Normal mood and affect.  Health Maintenance Due  Topic Date Due   COVID-19 Vaccine (3 - Booster for Moderna series) 09/05/2020     Lab Results: Lab Results  Component Value Date   CHOL 164 10/26/2018   HDL 42.90 10/26/2018   LDLCALC 94 10/26/2018   TRIG 135.0 10/26/2018   CHOLHDL 4 10/26/2018   Assessment & Plan:   1. Gastroesophageal reflux disease without esophagitis Stable on PRN Prilosec.  2. History of hyperlipidemia, mixed Last lipids showed normal lipid values. Recommend rechecking this in 2025.  3. Arthritis Stable on PRN  naproxen.  4. Chronic right shoulder pain I reviewed consult notes and shoulder x-ray results. He will have an MRI scan this weekend. Further management will be based on these results.  Daniel Salter, MD

## 2020-11-12 ENCOUNTER — Ambulatory Visit (INDEPENDENT_AMBULATORY_CARE_PROVIDER_SITE_OTHER): Payer: Medicare Other | Admitting: *Deleted

## 2020-11-12 DIAGNOSIS — Z0000 Encounter for general adult medical examination without abnormal findings: Secondary | ICD-10-CM | POA: Diagnosis not present

## 2020-11-12 NOTE — Progress Notes (Signed)
Subjective:   Daniel Barber is a 70 y.o. male who presents for Medicare Annual/Subsequent preventive examination.  I connected with  Daniel Barber on 11/12/20 by a telephone enabled telemedicine application and verified that I am speaking with the correct person using two identifiers.   I discussed the limitations of evaluation and management by telemedicine. The patient expressed understanding and agreed to proceed.   Review of Systems     Cardiac Risk Factors include: advanced age (>67men, >66 women);male gender     Objective:    Today's Vitals   11/12/20 0948  PainSc: 4    There is no height or weight on file to calculate BMI.  Advanced Directives 11/12/2020 11/07/2019 12/15/2017 08/28/2016  Does Patient Have a Medical Advance Directive? Yes Yes No No  Type of Paramedic of [place];Living will - -  Copy of [place] in Chart? No - copy requested No - copy requested - -  Would patient like information on creating a medical advance directive? No - Patient declined - No - Patient declined Yes (MAU/Ambulatory/Procedural Areas - Information given)    Current Medications (verified) Outpatient Encounter Medications as of 11/12/2020  Medication Sig   Ibuprofen 200 MG CAPS    naproxen sodium (ANAPROX) 220 MG tablet Take 220 mg by mouth 2 (two) times daily with a meal.   omeprazole (PRILOSEC) 10 MG capsule Take 10 mg by mouth as needed.    No facility-administered encounter medications on file as of 11/12/2020.    Allergies (verified) Patient has no known allergies.   History: Past Medical History:  Diagnosis Date   Arthritis    COPD, mild (HCC)    External hemorrhoids    symptomatic   GERD (gastroesophageal reflux disease)    History of adenomatous polyp of colon followed by dr Watt Climes   tubular adenoma's 1995;  2000;  2006;  2016   History of malignant melanoma of skin 2017   excision  posterior neck (localized)   History of prostate cancer followed by pcp   1999--  radical prostatectomy/  per pt last PSA less than 0.1 on May 2017   Nocturia    Wears glasses    Past Surgical History:  Procedure Laterality Date   COLONOSCOPY  last one 07-27-2014  dr Watt Climes   ELBOW SURGERY Right 1989   HEMORRHOID SURGERY N/A 08/28/2016   Procedure: HEMORRHOIDECTOMY;  Surgeon: Clovis Riley, MD;  Location: [facility];  Service: General;  Laterality: N/A;   KNEE ARTHROSCOPY Left 2002  approx.   MELANOMA EXCISION  2017   posterior neck (localized)   PROSTATECTOMY  1999   dr Risa Grill   Family History  Problem Relation Age of Onset   Colon cancer Mother    Heart attack Father    Colon cancer Maternal Uncle    Social History   Socioeconomic History   Marital status: Married    Spouse name: Not on file   Number of children: 2   Years of education: 18   Highest education level: Not on file  Occupational History   Occupation: Financial risk analyst   Occupation: Retired  Tobacco Use   Smoking status: Light Smoker    Types: Cigars    Last attempt to quit: 08/18/2006    Years since quitting: 14.2   Smokeless tobacco: Never   Tobacco comments:    smokes an occasional cigar 4-5 timees per year  Vaping Use  Vaping Use: Never used  Substance and Sexual Activity   Alcohol use: Yes    Comment: 2 DRINKS DAILY   Drug use: No   Sexual activity: Yes  Other Topics Concern   Not on file  Social History Narrative   Not on file   Social Determinants of Health   Financial Resource Strain: Low Risk    Difficulty of Paying Living Expenses: Not hard at all  Food Insecurity: No Food Insecurity   Worried About Charity fundraiser in the Last Year: Never true   [place] in the Last Year: Never true  Transportation Needs: No Transportation Needs   Lack of Transportation (Medical): No   Lack of Transportation (Non-Medical): No  Physical Activity: Sufficiently Active    Days of Exercise per Week: 3 days   Minutes of Exercise per Session: 50 min  Stress: No Stress Concern Present   Feeling of Stress : Not at all  Social Connections: Socially Integrated   Frequency of Communication with Friends and Family: Three times a week   Frequency of Social Gatherings with Friends and Family: More than three times a week   Attends Religious Services: More than 4 times per year   Active Member of Clubs or Organizations: Yes   Attends Music therapist: More than 4 times per year   Marital Status: Married    Tobacco Counseling Ready to quit: Not Answered Counseling given: Not Answered Tobacco comments: smokes an occasional cigar 4-5 timees per year   Clinical Intake:  Pre-visit preparation completed: Yes  Pain : 0-10 Pain Score: 4  Pain Type: Chronic pain Pain Location: Shoulder Pain Orientation: Right Pain Descriptors / Indicators: Aching, Constant Pain Onset: 1 to 4 weeks ago Pain Frequency: Intermittent Pain Relieving Factors: ibuprofen  Pain Relieving Factors: ibuprofen  Nutritional Risks: None Diabetes: No  How often do you need to have someone help you when you read instructions, pamphlets, or other written materials from your doctor or pharmacy?: 1 - Never  Diabetic?  no  Interpreter Needed?: No  Information entered by :: Leroy Kennedy LPN   Activities of Daily Living In your present state of health, do you have any difficulty performing the following activities: 11/12/2020  Hearing? N  Vision? N  Difficulty concentrating or making decisions? N  Walking or climbing stairs? N  Dressing or bathing? N  Doing errands, shopping? N  Preparing Food and eating ? N  Using the Toilet? N  In the past six months, have you accidently leaked urine? N  Do you have problems with loss of bowel control? N  Managing your Medications? N  Managing your Finances? N  Housekeeping or managing your Housekeeping? N  Some recent data might be  hidden    Patient Care Team: Haydee Salter, MD as PCP - General (Family Medicine) Danella Sensing, MD as Consulting Physician (Dermatology) Warden Fillers, MD as Consulting Physician (Ophthalmology)  Indicate any recent Medical Services you may have received from other than [facility] providers in the past year (date may be approximate).     Assessment:   This is a routine wellness examination for Daniel Barber.  Hearing/Vision screen Hearing Screening - Comments:: No trouble hearing Vision Screening - Comments:: Up to date Dr. Katy Fitch  Dietary issues and exercise activities discussed: Current Exercise Habits: The patient does not participate in regular exercise at present   Goals Addressed             This Visit's Progress  Patient Stated   Not on track    I want to get back into the Silver Sneakers program and start to exercise in the [facility].     Patient Stated       Wants to start exercising again         Depression Screen PHQ 2/9 Scores 11/12/2020 11/11/2020 11/07/2019 12/15/2017 06/11/2015  PHQ - 2 Score 0 0 0 0 0    Fall Risk Fall Risk  11/11/2020 11/07/2019 12/15/2017 06/11/2015  Falls in the past year? 0 0 0 Yes  Number falls in past yr: 0 0 - 1  Injury with Fall? 0 0 - No  Risk Factor Category  - - - (No Data)  Comment - - - Fell while working.   Risk for fall due to : No Fall Risks - - -  Follow up Falls evaluation completed Falls prevention discussed - -    FALL RISK PREVENTION PERTAINING TO THE HOME:  Any stairs in or around the home? No  If so, are there any without handrails? No  Home free of loose throw rugs in walkways, pet beds, electrical cords, etc? Yes  Adequate lighting in your home to reduce risk of falls? Yes   ASSISTIVE DEVICES UTILIZED TO PREVENT FALLS:  Life alert? No  Use of a cane, walker or w/c? No  Grab bars in the bathroom? Yes  Shower chair or bench in shower? Yes  Elevated toilet seat or a handicapped toilet? Yes   TIMED UP  AND GO:  Was the test performed? No .    Cognitive Function:  Normal cognitive status assessed by direct observation by this Nurse Health Advisor. No abnormalities found.       6CIT Screen 11/07/2019  What Year? 0 points  What month? 0 points  What time? 0 points  Count back from 20 0 points  Months in reverse 0 points  Repeat phrase 0 points  Total Score 0    Immunizations Immunization History  Administered Date(s) Administered   Fluad Quad(high Dose 65+) 10/19/2018, 11/07/2019, 10/08/2020   Influenza, High Dose Seasonal PF 12/05/2014, 11/25/2015, 10/30/2016, 12/15/2017   Moderna SARS-COV2 Booster Vaccination 11/15/2019, 07/11/2020   Moderna Sars-Covid-2 Vaccination 02/24/2019, 03/29/2019   Pneumococcal Conjugate-13 12/19/2014   Pneumococcal Polysaccharide-23 06/18/2016   Td 05/30/2014   Zoster Recombinat (Shingrix) 12/08/2019, 02/19/2020   Zoster, Live 05/30/2014    TDAP status: Up to date  Flu Vaccine status: Up to date  Pneumococcal vaccine status: Up to date  Covid-19 vaccine status: Completed vaccines  Qualifies for Shingles Vaccine? No   Zostavax completed Yes   Shingrix Completed?: Yes  Screening Tests Health Maintenance  Topic Date Due   COVID-19 Vaccine (3 - Booster for Moderna series) 09/05/2020   TETANUS/TDAP  05/29/2024   COLONOSCOPY (Pts 45-23yrs Insurance coverage will need to be confirmed)  08/05/2024   Pneumonia Vaccine 47+ Years old  Completed   INFLUENZA VACCINE  Completed   Hepatitis C Screening  Completed   Zoster Vaccines- Shingrix  Completed   HPV VACCINES  Aged Out    Health Maintenance  Health Maintenance Due  Topic Date Due   COVID-19 Vaccine (3 - Booster for Moderna series) 09/05/2020   Colorectal cancer screening: Type of screening: Colonoscopy. Completed  . Repeat every 10 years  Lung Cancer Screening: (Low Dose CT Chest recommended if Age 26-80 years, 30 pack-year currently smoking OR have quit w/in 15years.) does not  qualify.   Lung Cancer Screening Referral:  Additional Screening:  Hepatitis C Screening: does not qualify; Completed 2016  Vision Screening: Recommended annual ophthalmology exams for early detection of glaucoma and other disorders of the eye. Is the patient up to date with their annual eye exam?  Yes  Who is the provider or what is the name of the office in which the patient attends annual eye exams? Dr. Katy Fitch If pt is not established with a provider, would they like to be referred to a provider to establish care? No .   Dental Screening: Recommended annual dental exams for proper oral hygiene  Community Resource Referral / Chronic Care Management: CRR required this visit?  No   CCM required this visit?  No      Plan:     I have personally reviewed and noted the following in the patient's chart:   Medical and social history Use of alcohol, tobacco or illicit drugs  Current medications and supplements including opioid prescriptions. Patient is not currently taking opioid prescriptions. Functional ability and status Nutritional status Physical activity Advanced directives List of other physicians Hospitalizations, surgeries, and ER visits in previous 12 months Vitals Screenings to include cognitive, depression, and falls Referrals and appointments  In addition, I have reviewed and discussed with patient certain preventive protocols, quality metrics, and best practice recommendations. A written personalized care plan for preventive services as well as general preventive health recommendations were provided to patient.     Leroy Kennedy, LPN   [id number]   Nurse Notes:

## 2020-11-12 NOTE — Patient Instructions (Addendum)
Daniel Barber , Thank you for taking time to come for your Medicare Wellness Visit. I appreciate your ongoing commitment to your health goals. Please review the following plan we discussed and let me know if I can assist you in the future.   Screening recommendations/referrals: Colonoscopy: up to date Recommended yearly ophthalmology/optometry visit for glaucoma screening and checkup Recommended yearly dental visit for hygiene and checkup  Vaccinations: Influenza vaccine: up to date Pneumococcal vaccine: up to date Tdap vaccine: up to date Shingles vaccine: up to date    Advanced directives: copy requested  Conditions/risks identified:     Preventive Care 33 Years and Older, Male Preventive care refers to lifestyle choices and visits with your health care provider that can promote health and wellness. What does preventive care include? A yearly physical exam. This is also called an annual well check. Dental exams once or twice a year. Routine eye exams. Ask your health care provider how often you should have your eyes checked. Personal lifestyle choices, including: Daily care of your teeth and gums. Regular physical activity. Eating a healthy diet. Avoiding tobacco and drug use. Limiting alcohol use. Practicing safe sex. Taking low doses of aspirin every day. Taking vitamin and mineral supplements as recommended by your health care provider. What happens during an annual well check? The services and screenings done by your health care provider during your annual well check will depend on your age, overall health, lifestyle risk factors, and family history of disease. Counseling  Your health care provider may ask you questions about your: Alcohol use. Tobacco use. Drug use. Emotional well-being. Home and relationship well-being. Sexual activity. Eating habits. History of falls. Memory and ability to understand (cognition). Work and work Statistician. Screening  You may  have the following tests or measurements: Height, weight, and BMI. Blood pressure. Lipid and cholesterol levels. These may be checked every 5 years, or more frequently if you are over 40 years old. Skin check. Lung cancer screening. You may have this screening every year starting at age 79 if you have a 30-pack-year history of smoking and currently smoke or have quit within the past 15 years. Fecal occult blood test (FOBT) of the stool. You may have this test every year starting at age 3. Flexible sigmoidoscopy or colonoscopy. You may have a sigmoidoscopy every 5 years or a colonoscopy every 10 years starting at age 41. Prostate cancer screening. Recommendations will vary depending on your family history and other risks. Hepatitis C blood test. Hepatitis B blood test. Sexually transmitted disease (STD) testing. Diabetes screening. This is done by checking your blood sugar (glucose) after you have not eaten for a while (fasting). You may have this done every 1-3 years. Abdominal aortic aneurysm (AAA) screening. You may need this if you are a current or former smoker. Osteoporosis. You may be screened starting at age 84 if you are at high risk. Talk with your health care provider about your test results, treatment options, and if necessary, the need for more tests. Vaccines  Your health care provider may recommend certain vaccines, such as: Influenza vaccine. This is recommended every year. Tetanus, diphtheria, and acellular pertussis (Tdap, Td) vaccine. You may need a Td booster every 10 years. Zoster vaccine. You may need this after age 92. Pneumococcal 13-valent conjugate (PCV13) vaccine. One dose is recommended after age 37. Pneumococcal polysaccharide (PPSV23) vaccine. One dose is recommended after age 70. Talk to your health care provider about which screenings and vaccines you need and how  often you need them. This information is not intended to replace advice given to you by your  health care provider. Make sure you discuss any questions you have with your health care provider. Document Released: 02/01/2015 Document Revised: 09/25/2015 Document Reviewed: 11/06/2014 Elsevier Interactive Patient Education  2017 [place] Prevention in the Home Falls can cause injuries. They can happen to people of all ages. There are many things you can do to make your home safe and to help prevent falls. What can I do on the outside of my home? Regularly fix the edges of walkways and driveways and fix any cracks. Remove anything that might make you trip as you walk through a door, such as a raised step or threshold. Trim any bushes or trees on the path to your home. Use bright outdoor lighting. Clear any walking paths of anything that might make someone trip, such as rocks or tools. Regularly check to see if handrails are loose or broken. Make sure that both sides of any steps have handrails. Any raised decks and porches should have guardrails on the edges. Have any leaves, snow, or ice cleared regularly. Use sand or salt on walking paths during winter. Clean up any spills in your garage right away. This includes oil or grease spills. What can I do in the bathroom? Use night lights. Install grab bars by the toilet and in the tub and shower. Do not use towel bars as grab bars. Use non-skid mats or decals in the tub or shower. If you need to sit down in the shower, use a plastic, non-slip stool. Keep the floor dry. Clean up any water that spills on the floor as soon as it happens. Remove soap buildup in the tub or shower regularly. Attach bath mats securely with double-sided non-slip rug tape. Do not have throw rugs and other things on the floor that can make you trip. What can I do in the bedroom? Use night lights. Make sure that you have a light by your bed that is easy to reach. Do not use any sheets or blankets that are too big for your bed. They should not hang down  onto the floor. Have a firm chair that has side arms. You can use this for support while you get dressed. Do not have throw rugs and other things on the floor that can make you trip. What can I do in the kitchen? Clean up any spills right away. Avoid walking on wet floors. Keep items that you use a lot in easy-to-reach places. If you need to reach something above you, use a strong step stool that has a grab bar. Keep electrical cords out of the way. Do not use floor polish or wax that makes floors slippery. If you must use wax, use non-skid floor wax. Do not have throw rugs and other things on the floor that can make you trip. What can I do with my stairs? Do not leave any items on the stairs. Make sure that there are handrails on both sides of the stairs and use them. Fix handrails that are broken or loose. Make sure that handrails are as long as the stairways. Check any carpeting to make sure that it is firmly attached to the stairs. Fix any carpet that is loose or worn. Avoid having throw rugs at the top or bottom of the stairs. If you do have throw rugs, attach them to the floor with carpet tape. Make sure that you have a  light switch at the top of the stairs and the bottom of the stairs. If you do not have them, ask someone to add them for you. What else can I do to help prevent falls? Wear shoes that: Do not have high heels. Have rubber bottoms. Are comfortable and fit you well. Are closed at the toe. Do not wear sandals. If you use a stepladder: Make sure that it is fully opened. Do not climb a closed stepladder. Make sure that both sides of the stepladder are locked into place. Ask someone to hold it for you, if possible. Clearly mark and make sure that you can see: Any grab bars or handrails. First and last steps. Where the edge of each step is. Use tools that help you move around (mobility aids) if they are needed. These include: Canes. Walkers. Scooters. Crutches. Turn  on the lights when you go into a dark area. Replace any light bulbs as soon as they burn out. Set up your furniture so you have a clear path. Avoid moving your furniture around. If any of your floors are uneven, fix them. If there are any pets around you, be aware of where they are. Review your medicines with your doctor. Some medicines can make you feel dizzy. This can increase your chance of falling. Ask your doctor what other things that you can do to help prevent falls. This information is not intended to replace advice given to you by your health care provider. Make sure you discuss any questions you have with your health care provider. Document Released: 11/01/2008 Document Revised: 06/13/2015 Document Reviewed: 02/09/2014 Elsevier Interactive Patient Education  2017 [employer].

## 2020-11-13 ENCOUNTER — Other Ambulatory Visit: Payer: Self-pay | Admitting: Physician Assistant

## 2020-11-13 MED ORDER — TRAMADOL HCL 50 MG PO TABS
50.0000 mg | ORAL_TABLET | Freq: Four times a day (QID) | ORAL | 0 refills | Status: DC | PRN
Start: 2020-11-13 — End: 2020-11-20

## 2020-11-17 ENCOUNTER — Other Ambulatory Visit: Payer: Self-pay

## 2020-11-17 ENCOUNTER — Ambulatory Visit
Admission: RE | Admit: 2020-11-17 | Discharge: 2020-11-17 | Disposition: A | Discharge status: Home / Self Care | Payer: Medicare Other | Source: Ambulatory Visit | Attending: Physician Assistant | Admitting: Physician Assistant

## 2020-11-17 DIAGNOSIS — M25511 Pain in right shoulder: Secondary | ICD-10-CM

## 2020-11-17 DIAGNOSIS — M7581 Other shoulder lesions, right shoulder: Secondary | ICD-10-CM | POA: Diagnosis not present

## 2020-11-17 DIAGNOSIS — M75121 Complete rotator cuff tear or rupture of right shoulder, not specified as traumatic: Secondary | ICD-10-CM | POA: Diagnosis not present

## 2020-11-20 ENCOUNTER — Ambulatory Visit (INDEPENDENT_AMBULATORY_CARE_PROVIDER_SITE_OTHER): Payer: Medicare Other | Admitting: Physician Assistant

## 2020-11-20 ENCOUNTER — Encounter: Payer: Self-pay | Admitting: Physician Assistant

## 2020-11-20 DIAGNOSIS — S46011D Strain of muscle(s) and tendon(s) of the rotator cuff of right shoulder, subsequent encounter: Secondary | ICD-10-CM

## 2020-11-20 MED ORDER — HYDROCODONE-ACETAMINOPHEN 5-325 MG PO TABS: 1.0000 | ORAL_TABLET | Freq: Four times a day (QID) | ORAL | 0 refills | Status: DC | PRN

## 2020-11-20 NOTE — Progress Notes (Signed)
HPI: [name] returns today to go over the MRI of his right shoulder.  He continues to have severe pain in the right shoulder with difficulty sleeping.  He has had no other injury to the shoulder outside of the incident on 09/04/2020, where he was cranking a gas powered blower.  He states the cortisone injection gave him some relief for a brief amount of time but then the pain came back and is now severe.  He has been taking tramadol for the pain states this is not controlling his pain at night he only sleeps about 2 hours in the past week uptake Tylenol.  MRI MRI of the right shoulder dated 11/19/2020 is reviewed with the patient.  Actual images shown to the patient.  MRI shows severe tendinosis of the supraspinatus tendon with a large full-thickness tear measuring 2 cm from anterior to posterior.  No atrophy.  Mild glenoid cartilage loss no full-thickness loss.  Biceps long head tendon is intact with moderate tendinosis.  Physical exam: General well-developed well-nourished male no acute distress.  Passively I can bring his arm fully overhead with some discomfort.  Impression: Right shoulder rotator cuff tear  Plan: Recommend right shoulder arthroscopy with extensive debridement and possible rotator cuff repair.  Explained to him that the cup may be nonrepairable and in that instance we would clean the shoulder out.  Did discuss postoperative protocol if the rotator cuff was able to be repaired.  Questions were encouraged and answered by Dr. Ninfa Linden and myself.  We will see him back 1 week postop.  He is given Norco for pain.

## 2020-11-26 ENCOUNTER — Other Ambulatory Visit: Payer: Self-pay | Admitting: Physician Assistant

## 2020-11-26 MED ORDER — HYDROCODONE-ACETAMINOPHEN 5-325 MG PO TABS: 1.0000 | ORAL_TABLET | Freq: Four times a day (QID) | ORAL | 0 refills | Status: DC | PRN

## 2020-11-26 NOTE — Telephone Encounter (Signed)
See message f rom patient

## 2020-12-05 ENCOUNTER — Other Ambulatory Visit: Payer: Self-pay | Admitting: Orthopaedic Surgery

## 2020-12-05 DIAGNOSIS — W293XXA Contact with powered garden and outdoor hand tools and machinery, initial encounter: Secondary | ICD-10-CM | POA: Diagnosis not present

## 2020-12-05 DIAGNOSIS — S46011D Strain of muscle(s) and tendon(s) of the rotator cuff of right shoulder, subsequent encounter: Secondary | ICD-10-CM | POA: Diagnosis not present

## 2020-12-05 DIAGNOSIS — G8918 Other acute postprocedural pain: Secondary | ICD-10-CM | POA: Diagnosis not present

## 2020-12-05 DIAGNOSIS — S46011A Strain of muscle(s) and tendon(s) of the rotator cuff of right shoulder, initial encounter: Secondary | ICD-10-CM | POA: Diagnosis not present

## 2020-12-05 DIAGNOSIS — M659 Synovitis and tenosynovitis, unspecified: Secondary | ICD-10-CM | POA: Diagnosis not present

## 2020-12-05 MED ORDER — OXYCODONE HCL 5 MG PO TABS: 5.0000 mg | ORAL_TABLET | ORAL | 0 refills | Status: DC | PRN

## 2020-12-11 ENCOUNTER — Encounter: Payer: Self-pay | Admitting: Orthopaedic Surgery

## 2020-12-11 ENCOUNTER — Ambulatory Visit (INDEPENDENT_AMBULATORY_CARE_PROVIDER_SITE_OTHER): Payer: Medicare Other | Admitting: Orthopaedic Surgery

## 2020-12-11 ENCOUNTER — Other Ambulatory Visit: Payer: Self-pay

## 2020-12-11 DIAGNOSIS — Z9889 Other specified postprocedural states: Secondary | ICD-10-CM

## 2020-12-11 DIAGNOSIS — S46011D Strain of muscle(s) and tendon(s) of the rotator cuff of right shoulder, subsequent encounter: Secondary | ICD-10-CM

## 2020-12-11 MED ORDER — PREDNISONE 50 MG PO TABS: ORAL_TABLET | ORAL | 0 refills | Status: DC

## 2020-12-11 MED ORDER — OXYCODONE HCL 5 MG PO TABS: 5.0000 mg | ORAL_TABLET | Freq: Four times a day (QID) | ORAL | 0 refills | Status: DC | PRN

## 2020-12-11 NOTE — Progress Notes (Signed)
The patient is 1 week status post a right shoulder arthroscopy with rotator cuff repair.  He is 70 years old.  This was an acute tear from several months ago.  He developed significant synovitis in the glenohumeral joint.  We found red inflamed tissue everywhere.  The rotator cuff was a very difficult repair to get it repaired down.  He has been compliant with wearing a sling.  He is having significant pain and was on pain medication prior to surgery due to the severity of his pain.  He is needing a refill of oxycodone.  I did remove the sutures from his right shoulder today.  I went over the arthroscopy pictures with him showing him the extent of the repair of his rotator cuff.  I will have him still wear the sling for another 3 months with coming in and out of it for therapy and for comfort with no overhead activities and no heavy lifting with the right arm.  We will set him up for outpatient physical therapy and allow the therapist to go very slow with him given the nature of his very tenuous repair.  I will see him back in anywhere from 4 to 6 weeks to see how he is doing overall.  All questions and concerns were answered and addressed.  I am also going to put him on 5 days of prednisone to see if this will calm down inflammation within the shoulder.

## 2020-12-12 ENCOUNTER — Encounter: Payer: Self-pay | Admitting: Orthopaedic Surgery

## 2020-12-14 ENCOUNTER — Encounter: Payer: Self-pay | Admitting: Orthopaedic Surgery

## 2020-12-16 ENCOUNTER — Other Ambulatory Visit: Payer: Self-pay

## 2020-12-16 DIAGNOSIS — Z9889 Other specified postprocedural states: Secondary | ICD-10-CM

## 2020-12-16 DIAGNOSIS — S46011D Strain of muscle(s) and tendon(s) of the rotator cuff of right shoulder, subsequent encounter: Secondary | ICD-10-CM

## 2020-12-16 DIAGNOSIS — M25511 Pain in right shoulder: Secondary | ICD-10-CM

## 2020-12-18 ENCOUNTER — Other Ambulatory Visit: Payer: Self-pay | Admitting: Orthopaedic Surgery

## 2020-12-18 ENCOUNTER — Encounter: Payer: Self-pay | Admitting: Physical Therapy

## 2020-12-18 ENCOUNTER — Ambulatory Visit (INDEPENDENT_AMBULATORY_CARE_PROVIDER_SITE_OTHER): Payer: Medicare Other | Admitting: Physical Therapy

## 2020-12-18 ENCOUNTER — Other Ambulatory Visit: Payer: Self-pay

## 2020-12-18 DIAGNOSIS — M25512 Pain in left shoulder: Secondary | ICD-10-CM | POA: Diagnosis not present

## 2020-12-18 DIAGNOSIS — M25612 Stiffness of left shoulder, not elsewhere classified: Secondary | ICD-10-CM | POA: Diagnosis not present

## 2020-12-18 DIAGNOSIS — R600 Localized edema: Secondary | ICD-10-CM

## 2020-12-18 DIAGNOSIS — M25511 Pain in right shoulder: Secondary | ICD-10-CM

## 2020-12-18 DIAGNOSIS — M6281 Muscle weakness (generalized): Secondary | ICD-10-CM

## 2020-12-18 DIAGNOSIS — M25611 Stiffness of right shoulder, not elsewhere classified: Secondary | ICD-10-CM

## 2020-12-18 MED ORDER — PREDNISONE 50 MG PO TABS
ORAL_TABLET | ORAL | 0 refills | Status: DC
Start: 2020-12-18 — End: 2021-11-17

## 2020-12-18 NOTE — Therapy (Addendum)
[facility] [address] [place], [place], [address] Phone: [phone]   Fax:  [phone]  Physical Therapy Evaluation  Patient Details  Name: Daniel Barber MRN: [id number] Date of Birth: September 09, 1950 Referring Provider (PT): Jean Rosenthal MD   Encounter Date: 12/18/2020   PT End of Session - 12/18/20 1241     Visit Number 1    Number of Visits 17    Date for PT Re-Evaluation 02/14/21    Authorization Type KX modifier after visit 15    Progress Note Due on Visit 10    PT Start Time 1100    PT Stop Time 1150    PT Time Calculation (min) 50 min    Activity Tolerance Patient tolerated treatment well;Patient limited by pain    Behavior During Therapy [facility] for tasks assessed/performed             Past Medical History:  Diagnosis Date   Arthritis    COPD, mild (HCC)    External hemorrhoids    symptomatic   GERD (gastroesophageal reflux disease)    History of adenomatous polyp of colon followed by dr Watt Climes   tubular adenoma's 1995;  2000;  2006;  2016   History of malignant melanoma of skin 2017   excision posterior neck (localized)   History of prostate cancer followed by pcp   1999--  radical prostatectomy/  per pt last PSA less than 0.1 on May 2017   Nocturia    Wears glasses     Past Surgical History:  Procedure Laterality Date   COLONOSCOPY  last one 07-27-2014  dr Watt Climes   ELBOW SURGERY Right 1989   HEMORRHOID SURGERY N/A 08/28/2016   Procedure: HEMORRHOIDECTOMY;  Surgeon: Clovis Riley, MD;  Location: [place];  Service: General;  Laterality: N/A;   KNEE ARTHROSCOPY Left 2002  approx.   MELANOMA EXCISION  2017   posterior neck (localized)   PROSTATECTOMY  1999   dr Risa Grill    There were no vitals filed for this visit.    Subjective Assessment - 12/18/20 1227     Subjective Pt arriving today s/p traumatic right  rotator cuff repair on 12/05/2020. Pt reporting injury accured on  09/04/2020 when trying to start a pull start motor. Pt wearing a sling. Pt reporting 5/10 pain.    Pertinent History Prostate Cancer, GERD, Arthritis, COPD, knee arthroscopy left 2002    Limitations House hold activities;Other (comment)    Diagnostic tests Pre images:   1. Severe tendinosis of the supraspinatus tendon with a large  full-thickness tear measuring 2 cm in anterior-posterior dimension.  2. Moderate tendinosis of the infraspinatus tendon.  3. Moderate tendinosis of the intra-articular portion of the long  head of the biceps tendon.  4. Mild partial-thickness cartilage loss of the posterior glenoid  with subchondral reactive marrow changes.    Patient Stated Goals get back to fishing, working on old cars, gardening and shooting    Currently in Pain? Yes    Pain Score 5     Pain Location Shoulder    Pain Orientation Right    Pain Descriptors / Indicators Aching;Sore    Pain Type Surgical pain;Acute pain    Pain Onset 1 to 4 weeks ago    Pain Frequency Constant    Aggravating Factors  any movements    Pain Relieving Factors ice, pain medications, resting    Effect of Pain on Daily Activities currently in sling and limited with all ADL's  and household activities                [facility] PT Assessment - 12/18/20 0001       Assessment   Medical Diagnosis Z46.011D traumatic complete tear of rotator cuff, Z98.890 s/p right rotator cuff repair, M25.511 pain in right shoulder    Referring Provider (PT) Jean Rosenthal MD    Onset Date/Surgical Date 12/05/20    Hand Dominance Right    Next MD Visit 3-4 weeks from 12/11/2020    Prior Therapy yes, for left knee for arthroscopic surgery with torn meniscus in 2001      Precautions   Precaution Comments slow progression of range of motion per rotator cuff protocol, per Dr. Roel Cluck office visit on 12/11/2020      Restrictions   Other Position/Activity Restrictions sling until 01/01/2021      Balance Screen   Has the patient  fallen in the past 6 months No    Is the patient reluctant to leave their home because of a fear of falling?  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[employer], garden, work on old cars, shooting      Cognition   Overall Cognitive Status Within Functional Limits for tasks assessed      Observation/Other Assessments   Focus on Therapeutic Outcomes (FOTO)  30%, (predicted 63%)      Posture/Postural Control   Posture/Postural Control Postural limitations    Postural Limitations Rounded Shoulders;Forward head;Decreased lumbar lordosis      ROM / Strength   AROM / PROM / Strength PROM      PROM   Overall PROM Comments all measurements in supine    PROM Assessment Site Shoulder    Right/Left Shoulder Right;Left    Right Shoulder Flexion 58 Degrees   tightness at end range and increased pain   Right Shoulder ABduction 65 Degrees    Right Shoulder Internal Rotation 45 Degrees   shoulder abd 45 degrees   Right Shoulder External Rotation 30 Degrees   shoulder abd 45 degrees   Left Shoulder Extension 35 Degrees    Left Shoulder Flexion 150 Degrees    Left Shoulder ABduction 115 Degrees    Left Shoulder Internal Rotation 65 Degrees   shoulder at 45 degrees abducted   Left Shoulder External Rotation 70 Degrees   shoulder 45 degrees abducted     Strength   Overall Strength Comments not tested due to precuations      Palpation   Palpation comment Tenderness noted along incision sites, upper trap tenderness on the right      Transfers   Five time sit to stand comments  WNL      Ambulation/Gait   Gait Comments amb with right arm in  sling with step through gait pattern                        Objective measurements completed on examination: See above findings.       [facility] PT Treatment/Exercise - 12/18/20 0001  Exercises   Exercises Shoulder      Shoulder Exercises: Seated   Other Seated Exercises ball squeezes x 10    Other Seated Exercises elbow flexion x 10, brachioradialis flexion x 10      Shoulder Exercises: ROM/Strengthening   Pendulum circles: clockwise and counter clockwise, forward/back and side to side    Other ROM/Strengthening Exercises Table slides flexion/scaption x 5 holding 5 seconds each      Modalities   Modalities Vasopneumatic      Vasopneumatic   Number Minutes Vasopneumatic  10 minutes    Vasopnuematic Location  Shoulder    Vasopneumatic Pressure Low    Vasopneumatic Temperature  34                     PT Education - 12/18/20 1113     Education Details PT POC, HEP    Person(s) Educated Patient    Methods Explanation    Comprehension Verbalized understanding              PT Short Term Goals - 12/18/20 1213       PT SHORT TERM GOAL #1   Title Pt will be independent in his initial HEP.    Time 4    Period Weeks    Status New    Target Date 01/17/21      PT SHORT TERM GOAL #2   Title Pt will be able to improve his passive right shoulder flexion to 90 degrees with pain </= 4/10.    Time 4    Period Weeks    Status New               PT Long Term Goals - 12/18/20 1215       PT LONG TERM GOAL #1   Title Pt will improve his FOTO to >/= 63%    Baseline 30% on 12/18/2020    Time 8    Period Weeks    Status New    Target Date 02/14/21      PT LONG TERM GOAL #2   Title Pt will  improve his right shoulder flexion to >/= 140 degrees.    Time 8    Period Weeks    Status New    Target Date 02/14/21      PT LONG TERM GOAL #3   Title Pt will improve right passive ER to >/= 65 degrees.    Time 8    Period Weeks     Target Date 02/14/21      PT LONG TERM GOAL #4   Title Pt will be independent in his advanced HEP following protocol progression.    Time 8    Period Weeks    Target Date 02/14/21      PT LONG TERM GOAL #5   Title Pt will be able to report improved sleep by 50%.    Time 8    Period Weeks    Status New    Target Date 02/14/21                    Plan - 12/18/20 1134     Clinical Impression Statement Pt is s/p traumatic complete tear of his right rotator cuff with repair performed on 12/05/2020. Pt is currently 2 weeks post op and will be in sling until 01/01/2021. Pt with limited passive ROM and reporting pain of 5/10 at rest and increased pain to 7-8/10 with movements. Per Dr. Trevor Mace orders  slow progression of ROM with therapy. Skilled PT needed to address the above impairments with below interventions.    Personal Factors and Comorbidities Comorbidity 3+    Comorbidities prostate CA, COPD, arthritis, GERD, left knee surgery    Examination-Activity Limitations Lift;Dressing;Carry;Transfers;Other;Bathing;Sleep    Stability/Clinical Decision Making Stable/Uncomplicated    Clinical Decision Making Low    Rehab Potential Good    PT Frequency 2x / week    PT Duration 8 weeks    PT Treatment/Interventions ADLs/Self Care Home Management;Cryotherapy;Electrical Stimulation;Iontophoresis 4mg /ml Dexamethasone;Ultrasound;Balance training;Therapeutic exercise;Therapeutic activities;Functional mobility training;Stair training;Gait training;Neuromuscular re-education;Patient/family education;Manual techniques;Passive range of motion;Dry needling;Taping;Vasopneumatic Device;Joint Manipulations;Moist Heat    PT Next Visit Plan Slow progression of rotator cuff protocol per Dr. Ninfa Linden, PROM, Vasopneumatic    PT Home Exercise Plan Access Code: [email or web address]  URL: [email or web address]  Date: 12/18/2020  Prepared by: Kearney Hard    Exercises  Circular Shoulder Pendulum with Table  Support - 3-4 x daily - 7 x weekly  Flexion-Extension Shoulder Pendulum with Table Support - 3-4 x daily - 7 x weekly  Horizontal Shoulder Pendulum with Table Support - 3-4 x daily - 7 x weekly  Forearm Strengthening with Ball Squeeze - 3-4 x daily - 7 x weekly - 2 sets - 10 reps  Seated Biceps Curl - 3-4 x daily - 7 x weekly - 2 sets - 10 reps  Seated Shoulder Flexion Towel Slide at Table Top - 3-4 x daily - 7 x weekly - 2 sets - 10 reps    Consulted and Agree with Plan of Care Patient             Patient will benefit from skilled therapeutic intervention in order to improve the following deficits and impairments:  Pain, Postural dysfunction, Decreased strength, Increased edema, Other (comment), Decreased activity tolerance, Impaired UE functional use, Decreased range of motion, Impaired flexibility  Visit Diagnosis:  Acute pain of right shoulder  (primary encounter diagnosis)   Localized edema  Plan: PT plan of care cert/re-cert   Muscle weakness (generalized)  Plan: PT plan of care cert/re-cert   Stiffness of right shoulder, not elsewhere classified M25.611      Problem List Patient Active Problem List   Diagnosis Date Noted   History of prostate cancer 11/11/2020   Right shoulder pain 09/02/2020   History of hyperlipidemia, mixed 12/15/2017   Arthritis 12/14/2016   Gastroesophageal reflux disease without esophagitis 12/14/2016    Oretha Caprice, PT, MPT 12/18/2020, 12:42 PM  [facility] [address] [place], [place], [address] Phone: [phone]   Fax:  [phone]  Name: Daniel Barber MRN: [id number] Date of Birth: 12-Aug-1950

## 2020-12-18 NOTE — Patient Instructions (Signed)
Pre images:  1. Severe tendinosis of the supraspinatus tendon with a large full-thickness tear measuring 2 cm in anterior-posterior dimension. 2. Moderate tendinosis of the infraspinatus tendon. 3. Moderate tendinosis of the intra-articular portion of the long head of the biceps tendon. 4. Mild partial-thickness cartilage loss of the posterior glenoid with subchondral reactive marrow changes.

## 2020-12-19 ENCOUNTER — Ambulatory Visit (INDEPENDENT_AMBULATORY_CARE_PROVIDER_SITE_OTHER): Payer: Medicare Other | Admitting: Physical Therapy

## 2020-12-19 ENCOUNTER — Encounter: Payer: Self-pay | Admitting: Physical Therapy

## 2020-12-19 DIAGNOSIS — M6281 Muscle weakness (generalized): Secondary | ICD-10-CM

## 2020-12-19 DIAGNOSIS — M25612 Stiffness of left shoulder, not elsewhere classified: Secondary | ICD-10-CM | POA: Diagnosis not present

## 2020-12-19 DIAGNOSIS — M25611 Stiffness of right shoulder, not elsewhere classified: Secondary | ICD-10-CM

## 2020-12-19 DIAGNOSIS — M25512 Pain in left shoulder: Secondary | ICD-10-CM | POA: Diagnosis not present

## 2020-12-19 DIAGNOSIS — M25511 Pain in right shoulder: Secondary | ICD-10-CM

## 2020-12-19 DIAGNOSIS — R600 Localized edema: Secondary | ICD-10-CM

## 2020-12-19 NOTE — Therapy (Addendum)
[facility] [address] [place], [place], [address] Phone: [phone]   Fax:  [phone]  Physical Therapy Treatment  Patient Details  Name: Daniel Barber MRN: [id number] Date of Birth: 1950/08/31 Referring Provider (PT): Jean Rosenthal MD   Encounter Date: 12/19/2020   PT End of Session - 12/19/20 1443     Visit Number 2    Number of Visits 17    Date for PT Re-Evaluation 02/14/21    Authorization Type KX modifier after visit 15    Progress Note Due on Visit 10    PT Start Time 1415    PT Stop Time 6294    PT Time Calculation (min) 38 min    Activity Tolerance Patient tolerated treatment well;Patient limited by pain    Behavior During Therapy [facility] for tasks assessed/performed             Past Medical History:  Diagnosis Date   Arthritis    COPD, mild (HCC)    External hemorrhoids    symptomatic   GERD (gastroesophageal reflux disease)    History of adenomatous polyp of colon followed by dr Watt Climes   tubular adenoma's 1995;  2000;  2006;  2016   History of malignant melanoma of skin 2017   excision posterior neck (localized)   History of prostate cancer followed by pcp   1999--  radical prostatectomy/  per pt last PSA less than 0.1 on May 2017   Nocturia    Wears glasses     Past Surgical History:  Procedure Laterality Date   COLONOSCOPY  last one 07-27-2014  dr Watt Climes   ELBOW SURGERY Right 1989   HEMORRHOID SURGERY N/A 08/28/2016   Procedure: HEMORRHOIDECTOMY;  Surgeon: Clovis Riley, MD;  Location: [place];  Service: General;  Laterality: N/A;   KNEE ARTHROSCOPY Left 2002  approx.   MELANOMA EXCISION  2017   posterior neck (localized)   PROSTATECTOMY  1999   dr Risa Grill    There were no vitals filed for this visit.   Subjective Assessment - 12/19/20 1408     Subjective "I was doing really well until yesterday."  Reports PT was painful but understands the importance of PT.     Pertinent History Prostate Cancer, GERD, Arthritis, COPD, knee arthroscopy left 2002    Limitations House hold activities;Other (comment)    Diagnostic tests Pre images:   1. Severe tendinosis of the supraspinatus tendon with a large  full-thickness tear measuring 2 cm in anterior-posterior dimension.  2. Moderate tendinosis of the infraspinatus tendon.  3. Moderate tendinosis of the intra-articular portion of the long  head of the biceps tendon.  4. Mild partial-thickness cartilage loss of the posterior glenoid  with subchondral reactive marrow changes.    Patient Stated Goals get back to fishing, working on old cars, gardening and shooting    Currently in Pain? Yes    Pain Score 5     Pain Location Shoulder    Pain Orientation Right    Pain Descriptors / Indicators Aching;Sore    Pain Type Acute pain;Surgical pain    Pain Onset 1 to 4 weeks ago    Pain Frequency Constant    Aggravating Factors  any movements    Pain Relieving Factors ice, pain meds, resting                               [facility] Adult PT Treatment/Exercise -  12/19/20 1415       Shoulder Exercises: Seated   Other Seated Exercises ball squeezes x 10    Other Seated Exercises elbow flexion x 20, brachioradialis flexion x 20      Shoulder Exercises: ROM/Strengthening   Pendulum circles: clockwise and counter clockwise, forward/back and side to side x 1 min each      Modalities   Modalities Vasopneumatic      Vasopneumatic   Number Minutes Vasopneumatic  10 minutes    Vasopnuematic Location  Shoulder    Vasopneumatic Pressure Low    Vasopneumatic Temperature  34      Manual Therapy   Manual therapy comments Rt shoulder PROM to tolerance                       PT Short Term Goals - 12/19/20 1444       PT SHORT TERM GOAL #1   Title Pt will be independent in his initial HEP.    Time 4    Period Weeks    Status Achieved    Target Date 01/17/21      PT SHORT TERM GOAL #2    Title Pt will be able to improve his passive right shoulder flexion to 90 degrees with pain </= 4/10.    Time 4    Period Weeks    Status On-going    Target Date 01/17/21               PT Long Term Goals - 12/18/20 1215       PT LONG TERM GOAL #1   Title Pt will improve his FOTO to >/= 63%    Baseline 30% on 12/18/2020    Time 8    Period Weeks    Status New    Target Date 02/14/21      PT LONG TERM GOAL #2   Title Pt will  improve his right shoulder flexion to >/= 140 degrees.    Time 8    Period Weeks    Status New    Target Date 02/14/21      PT LONG TERM GOAL #3   Title Pt will improve right passive ER to >/= 65 degrees.    Time 8    Period Weeks    Target Date 02/14/21      PT LONG TERM GOAL #4   Title Pt will be independent in his advanced HEP following protocol progression.    Time 8    Period Weeks    Target Date 02/14/21      PT LONG TERM GOAL #5   Title Pt will be able to report improved sleep by 50%.    Time 8    Period Weeks    Status New    Target Date 02/14/21                   Plan - 12/19/20 1444     Clinical Impression Statement Pt demonstrates good understanding of current HEP meeting STG #1 at this time, and unable to progress HEP until MD allows for AA/AROM.  Will continue to benefit from PT to maximize function.  Still has pain and tightness in shoulder so PROM still limited as expected.    Personal Factors and Comorbidities Comorbidity 3+    Comorbidities prostate CA, COPD, arthritis, GERD, left knee surgery    Examination-Activity Limitations Lift;Dressing;Carry;Transfers;Other;Bathing;Sleep    Stability/Clinical Decision Making Stable/Uncomplicated    Rehab Potential  Good    PT Frequency 2x / week    PT Duration 8 weeks    PT Treatment/Interventions ADLs/Self Care Home Management;Cryotherapy;Electrical Stimulation;Iontophoresis 4mg /ml Dexamethasone;Ultrasound;Balance training;Therapeutic exercise;Therapeutic  activities;Functional mobility training;Stair training;Gait training;Neuromuscular re-education;Patient/family education;Manual techniques;Passive range of motion;Dry needling;Taping;Vasopneumatic Device;Joint Manipulations;Moist Heat    PT Next Visit Plan Slow progression of rotator cuff protocol per Dr. Ninfa Linden, PROM, Vasopneumatic    PT Home Exercise Plan Access Code: [email or web address]  URL: [email or web address]  Date: 12/18/2020  Prepared by: Kearney Hard    Exercises  Circular Shoulder Pendulum with Table Support - 3-4 x daily - 7 x weekly  Flexion-Extension Shoulder Pendulum with Table Support - 3-4 x daily - 7 x weekly  Horizontal Shoulder Pendulum with Table Support - 3-4 x daily - 7 x weekly  Forearm Strengthening with Ball Squeeze - 3-4 x daily - 7 x weekly - 2 sets - 10 reps  Seated Biceps Curl - 3-4 x daily - 7 x weekly - 2 sets - 10 reps  Seated Shoulder Flexion Towel Slide at Table Top - 3-4 x daily - 7 x weekly - 2 sets - 10 reps    Consulted and Agree with Plan of Care Patient             Patient will benefit from skilled therapeutic intervention in order to improve the following deficits and impairments:  Pain, Postural dysfunction, Decreased strength, Increased edema, Other (comment), Decreased activity tolerance, Impaired UE functional use, Decreased range of motion, Impaired flexibility  Visit Diagnosis: Acute pain of right shoulder  (primary encounter diagnosis)   Localized edema   Muscle weakness (generalized)   Stiffness of right shoulder, not elsewhere classified     Problem List Patient Active Problem List   Diagnosis Date Noted   History of prostate cancer 11/11/2020   Right shoulder pain 09/02/2020   History of hyperlipidemia, mixed 12/15/2017   Arthritis 12/14/2016   Gastroesophageal reflux disease without esophagitis 12/14/2016      Laureen Abrahams, PT, DPT 12/19/20 2:47 PM     [facility] [address] [place], [place], [address] Phone: [phone]   Fax:  [phone]  Name: CASSIUS CULLINANE MRN: [id number] Date of Birth: [birth year]

## 2020-12-23 DIAGNOSIS — Z23 Encounter for immunization: Secondary | ICD-10-CM | POA: Diagnosis not present

## 2020-12-24 ENCOUNTER — Encounter: Payer: Self-pay | Admitting: Physical Therapy

## 2020-12-24 ENCOUNTER — Other Ambulatory Visit: Payer: Self-pay

## 2020-12-24 ENCOUNTER — Ambulatory Visit (INDEPENDENT_AMBULATORY_CARE_PROVIDER_SITE_OTHER): Payer: Medicare Other | Admitting: Physical Therapy

## 2020-12-24 DIAGNOSIS — R600 Localized edema: Secondary | ICD-10-CM | POA: Diagnosis not present

## 2020-12-24 DIAGNOSIS — M25611 Stiffness of right shoulder, not elsewhere classified: Secondary | ICD-10-CM | POA: Diagnosis not present

## 2020-12-24 DIAGNOSIS — M6281 Muscle weakness (generalized): Secondary | ICD-10-CM

## 2020-12-24 DIAGNOSIS — M25511 Pain in right shoulder: Secondary | ICD-10-CM

## 2020-12-24 NOTE — Addendum Note (Signed)
Addended by: Kearney Hard R on: 12/24/2020 12:36 PM   Modules accepted: Orders

## 2020-12-24 NOTE — Therapy (Signed)
[facility] Physical Therapy [address] [place], [place], [address] Phone: [phone]   Fax:  [phone]  Physical Therapy Treatment  Patient Details  Name: Daniel Barber MRN: [id number] Date of Birth: Jun 04, 1950 Referring Provider (PT): Jean Rosenthal MD   Encounter Date: 12/24/2020   PT End of Session - 12/24/20 1505     Visit Number 3    Number of Visits 17    Date for PT Re-Evaluation 02/14/21    Authorization Type KX modifier after visit 15    Progress Note Due on Visit 10    PT Start Time 1430    PT Stop Time 2542    PT Time Calculation (min) 45 min    Activity Tolerance Patient tolerated treatment well;Patient limited by pain    Behavior During Therapy [facility] for tasks assessed/performed             Past Medical History:  Diagnosis Date   Arthritis    COPD, mild (HCC)    External hemorrhoids    symptomatic   GERD (gastroesophageal reflux disease)    History of adenomatous polyp of colon followed by dr Watt Climes   tubular adenoma's 1995;  2000;  2006;  2016   History of malignant melanoma of skin 2017   excision posterior neck (localized)   History of prostate cancer followed by pcp   1999--  radical prostatectomy/  per pt last PSA less than 0.1 on May 2017   Nocturia    Wears glasses     Past Surgical History:  Procedure Laterality Date   COLONOSCOPY  last one 07-27-2014  dr Watt Climes   ELBOW SURGERY Right 1989   HEMORRHOID SURGERY N/A 08/28/2016   Procedure: HEMORRHOIDECTOMY;  Surgeon: Clovis Riley, MD;  Location: [place];  Service: General;  Laterality: N/A;   KNEE ARTHROSCOPY Left 2002  approx.   MELANOMA EXCISION  2017   posterior neck (localized)   PROSTATECTOMY  1999   dr Risa Grill    There were no vitals filed for this visit.   Subjective Assessment - 12/24/20 1505     Subjective relays a little more sore overall today, has been doing his HEP.    Pertinent History Prostate Cancer, GERD,  Arthritis, COPD, knee arthroscopy left 2002    Limitations House hold activities;Other (comment)    Diagnostic tests Pre images:   1. Severe tendinosis of the supraspinatus tendon with a large  full-thickness tear measuring 2 cm in anterior-posterior dimension.  2. Moderate tendinosis of the infraspinatus tendon.  3. Moderate tendinosis of the intra-articular portion of the long  head of the biceps tendon.  4. Mild partial-thickness cartilage loss of the posterior glenoid  with subchondral reactive marrow changes.    Patient Stated Goals get back to fishing, working on old cars, gardening and shooting    Pain Onset 1 to 4 weeks ago              [facility] Adult PT Treatment/Exercise - 12/24/20 0001       Shoulder Exercises: Seated   Retraction Both;20 reps    Retraction Limitations with hands supported in lap    Other Seated Exercises green digi grip 20 X2    Other Seated Exercises seated wrist flexion, extension, and supination to pronation all with 3# weight X 20 ea with shoulder/elbow supported on table      Shoulder Exercises: Standing   Other Standing Exercises pendulums X 20 circles CW and CCW    Other Standing  Exercises elbow AROM X20 on Rt      Vasopneumatic   Number Minutes Vasopneumatic  10 minutes    Vasopnuematic Location  Shoulder    Vasopneumatic Pressure Medium    Vasopneumatic Temperature  34      Manual Therapy   Manual therapy comments Rt shoulder PROM to tolerance                       PT Short Term Goals - 12/19/20 1444       PT SHORT TERM GOAL #1   Title Pt will be independent in his initial HEP.    Time 4    Period Weeks    Status Achieved    Target Date 01/17/21      PT SHORT TERM GOAL #2   Title Pt will be able to improve his passive right shoulder flexion to 90 degrees with pain </= 4/10.    Time 4    Period Weeks    Status On-going    Target Date 01/17/21               PT Long Term Goals - 12/18/20 1215       PT LONG TERM  GOAL #1   Title Pt will improve his FOTO to >/= 63%    Baseline 30% on 12/18/2020    Time 8    Period Weeks    Status New    Target Date 02/14/21      PT LONG TERM GOAL #2   Title Pt will  improve his right shoulder flexion to >/= 140 degrees.    Time 8    Period Weeks    Status New    Target Date 02/14/21      PT LONG TERM GOAL #3   Title Pt will improve right passive ER to >/= 65 degrees.    Time 8    Period Weeks    Target Date 02/14/21      PT LONG TERM GOAL #4   Title Pt will be independent in his advanced HEP following protocol progression.    Time 8    Period Weeks    Target Date 02/14/21      PT LONG TERM GOAL #5   Title Pt will be able to report improved sleep by 50%.    Time 8    Period Weeks    Status New    Target Date 02/14/21                   Plan - 12/24/20 1506     Clinical Impression Statement Session focused on PROM only for his Rt shoulder per post op restrictions. We did add a little more for wrist strengthening with his shoulder supported on table. He had good overall tolerance within session today. Continue POC    Personal Factors and Comorbidities Comorbidity 3+    Comorbidities prostate CA, COPD, arthritis, GERD, left knee surgery    Examination-Activity Limitations Lift;Dressing;Carry;Transfers;Other;Bathing;Sleep    Stability/Clinical Decision Making Stable/Uncomplicated    Rehab Potential Good    PT Frequency 2x / week    PT Duration 8 weeks    PT Treatment/Interventions ADLs/Self Care Home Management;Cryotherapy;Electrical Stimulation;Iontophoresis 4mg /ml Dexamethasone;Ultrasound;Balance training;Therapeutic exercise;Therapeutic activities;Functional mobility training;Stair training;Gait training;Neuromuscular re-education;Patient/family education;Manual techniques;Passive range of motion;Dry needling;Taping;Vasopneumatic Device;Joint Manipulations;Moist Heat    PT Next Visit Plan Slow progression of rotator cuff protocol per Dr.  Ninfa Linden, PROM, Vasopneumatic    PT Home Exercise Plan Access  Code: [email or web address]  URL: [email or web address]  Date: 12/18/2020  Prepared by: Kearney Hard    Exercises  Circular Shoulder Pendulum with Table Support - 3-4 x daily - 7 x weekly  Flexion-Extension Shoulder Pendulum with Table Support - 3-4 x daily - 7 x weekly  Horizontal Shoulder Pendulum with Table Support - 3-4 x daily - 7 x weekly  Forearm Strengthening with Ball Squeeze - 3-4 x daily - 7 x weekly - 2 sets - 10 reps  Seated Biceps Curl - 3-4 x daily - 7 x weekly - 2 sets - 10 reps  Seated Shoulder Flexion Towel Slide at Table Top - 3-4 x daily - 7 x weekly - 2 sets - 10 reps    Consulted and Agree with Plan of Care Patient             Patient will benefit from skilled therapeutic intervention in order to improve the following deficits and impairments:  Pain, Postural dysfunction, Decreased strength, Increased edema, Other (comment), Decreased activity tolerance, Impaired UE functional use, Decreased range of motion, Impaired flexibility  Visit Diagnosis: Localized edema  Muscle weakness (generalized)  Acute pain of right shoulder  Stiffness of right shoulder, not elsewhere classified     Problem List Patient Active Problem List   Diagnosis Date Noted   History of prostate cancer 11/11/2020   Right shoulder pain 09/02/2020   History of hyperlipidemia, mixed 12/15/2017   Arthritis 12/14/2016   Gastroesophageal reflux disease without esophagitis 12/14/2016    Debbe Odea, PT,DPT 12/24/2020, 3:10 PM  [facility] Physical Therapy [address] [place], [place], [address] Phone: [phone]   Fax:  [phone]  Name: Daniel Barber MRN: [id number] Date of Birth: 02-06-1950

## 2020-12-26 ENCOUNTER — Ambulatory Visit (INDEPENDENT_AMBULATORY_CARE_PROVIDER_SITE_OTHER): Payer: Medicare Other | Admitting: Physical Therapy

## 2020-12-26 ENCOUNTER — Encounter: Payer: Self-pay | Admitting: Physical Therapy

## 2020-12-26 ENCOUNTER — Other Ambulatory Visit: Payer: Self-pay

## 2020-12-26 DIAGNOSIS — R600 Localized edema: Secondary | ICD-10-CM | POA: Diagnosis not present

## 2020-12-26 DIAGNOSIS — M6281 Muscle weakness (generalized): Secondary | ICD-10-CM | POA: Diagnosis not present

## 2020-12-26 DIAGNOSIS — M25611 Stiffness of right shoulder, not elsewhere classified: Secondary | ICD-10-CM

## 2020-12-26 DIAGNOSIS — M25511 Pain in right shoulder: Secondary | ICD-10-CM

## 2020-12-26 NOTE — Therapy (Signed)
[facility] Physical Therapy [address] [place], [place], [address] Phone: [phone]   Fax:  [phone]  Physical Therapy Treatment  Patient Details  Name: Daniel Barber MRN: [id number] Date of Birth: August 12, 1950 Referring Provider (PT): Jean Rosenthal MD   Encounter Date: 12/26/2020   PT End of Session - 12/26/20 1455     Visit Number 4    Number of Visits 17    Date for PT Re-Evaluation 02/14/21    Authorization Type KX modifier after visit 15    Progress Note Due on Visit 10    PT Start Time 6761    PT Stop Time 1505    PT Time Calculation (min) 40 min    Activity Tolerance Patient tolerated treatment well    Behavior During Therapy [facility] for tasks assessed/performed             Past Medical History:  Diagnosis Date   Arthritis    COPD, mild ([place])    External hemorrhoids    symptomatic   GERD (gastroesophageal reflux disease)    History of adenomatous polyp of colon followed by dr Watt Climes   tubular adenoma's 1995;  2000;  2006;  2016   History of malignant melanoma of skin 2017   excision posterior neck (localized)   History of prostate cancer followed by pcp   1999--  radical prostatectomy/  per pt last PSA less than 0.1 on May 2017   Nocturia    Wears glasses     Past Surgical History:  Procedure Laterality Date   COLONOSCOPY  last one 07-27-2014  dr Watt Climes   ELBOW SURGERY Right 1989   HEMORRHOID SURGERY N/A 08/28/2016   Procedure: HEMORRHOIDECTOMY;  Surgeon: Clovis Riley, MD;  Location: [place];  Service: General;  Laterality: N/A;   KNEE ARTHROSCOPY Left 2002  approx.   MELANOMA EXCISION  2017   posterior neck (localized)   PROSTATECTOMY  1999   dr Risa Grill    There were no vitals filed for this visit.   Subjective Assessment - 12/26/20 1452     Subjective his shoulder feels about the same as last time, not too much increased sorness from last visit.    Pertinent History Prostate Cancer, GERD,  Arthritis, COPD, knee arthroscopy left 2002    Limitations House hold activities;Other (comment)    Diagnostic tests Pre images:   1. Severe tendinosis of the supraspinatus tendon with a large  full-thickness tear measuring 2 cm in anterior-posterior dimension.  2. Moderate tendinosis of the infraspinatus tendon.  3. Moderate tendinosis of the intra-articular portion of the long  head of the biceps tendon.  4. Mild partial-thickness cartilage loss of the posterior glenoid  with subchondral reactive marrow changes.    Patient Stated Goals get back to fishing, working on old cars, gardening and shooting    Pain Onset 1 to 4 weeks ago               [facility] Adult PT Treatment/Exercise - 12/26/20 0001       Shoulder Exercises: Seated   Retraction Both;20 reps    Retraction Limitations with hands supported in lap    Other Seated Exercises blue digi grip 20 X2    Other Seated Exercises seated wrist flexion, extension, and supination to pronation all with 3# weight X 20 ea with shoulder/elbow supported on table      Shoulder Exercises: Standing   Other Standing Exercises pendulums X 20 circles CW and CCW  Other Standing Exercises elbow AROM X20 on Rt      Vasopneumatic   Number Minutes Vasopneumatic  10 minutes    Vasopnuematic Location  Shoulder    Vasopneumatic Pressure Medium    Vasopneumatic Temperature  34      Manual Therapy   Manual therapy comments Rt shoulder PROM to tolerance                       PT Short Term Goals - 12/19/20 1444       PT SHORT TERM GOAL #1   Title Pt will be independent in his initial HEP.    Time 4    Period Weeks    Status Achieved    Target Date 01/17/21      PT SHORT TERM GOAL #2   Title Pt will be able to improve his passive right shoulder flexion to 90 degrees with pain </= 4/10.    Time 4    Period Weeks    Status On-going    Target Date 01/17/21               PT Long Term Goals - 12/18/20 1215       PT LONG  TERM GOAL #1   Title Pt will improve his FOTO to >/= 63%    Baseline 30% on 12/18/2020    Time 8    Period Weeks    Status New    Target Date 02/14/21      PT LONG TERM GOAL #2   Title Pt will  improve his right shoulder flexion to >/= 140 degrees.    Time 8    Period Weeks    Status New    Target Date 02/14/21      PT LONG TERM GOAL #3   Title Pt will improve right passive ER to >/= 65 degrees.    Time 8    Period Weeks    Target Date 02/14/21      PT LONG TERM GOAL #4   Title Pt will be independent in his advanced HEP following protocol progression.    Time 8    Period Weeks    Target Date 02/14/21      PT LONG TERM GOAL #5   Title Pt will be able to report improved sleep by 50%.    Time 8    Period Weeks    Status New    Target Date 02/14/21                   Plan - 12/26/20 1500     Clinical Impression Statement Continued with PROM to maintain post op restrictions and he shows good understanding of this. He had good overall tolerance to session but does have more pain and guarding trying to move into flexion. He will miss next week due to vacation to I added a few more exercises into HEP for him to perform while he is gone. He shows good understanding of these.    Personal Factors and Comorbidities Comorbidity 3+    Comorbidities prostate CA, COPD, arthritis, GERD, left knee surgery    Examination-Activity Limitations Lift;Dressing;Carry;Transfers;Other;Bathing;Sleep    Stability/Clinical Decision Making Stable/Uncomplicated    Rehab Potential Good    PT Frequency 2x / week    PT Duration 8 weeks    PT Treatment/Interventions ADLs/Self Care Home Management;Cryotherapy;Electrical Stimulation;Iontophoresis 4mg /ml Dexamethasone;Ultrasound;Balance training;Therapeutic exercise;Therapeutic activities;Functional mobility training;Stair training;Gait training;Neuromuscular re-education;Patient/family education;Manual techniques;Passive range of motion;Dry  needling;Taping;Vasopneumatic  Device;Joint Manipulations;Moist Heat    PT Next Visit Plan Slow progression of rotator cuff protocol per Dr. Ninfa Linden, PROM, Vasopneumatic    PT Home Exercise Plan Access Code: [email or web address]  URL: [email or web address]  Date: 12/18/2020  Prepared by: Kearney Hard    Exercises  Circular Shoulder Pendulum with Table Support - 3-4 x daily - 7 x weekly  Flexion-Extension Shoulder Pendulum with Table Support - 3-4 x daily - 7 x weekly  Horizontal Shoulder Pendulum with Table Support - 3-4 x daily - 7 x weekly  Forearm Strengthening with Ball Squeeze - 3-4 x daily - 7 x weekly - 2 sets - 10 reps  Seated Biceps Curl - 3-4 x daily - 7 x weekly - 2 sets - 10 reps  Seated Shoulder Flexion Towel Slide at Table Top - 3-4 x daily - 7 x weekly - 2 sets - 10 reps. Added scap retraction X20, wrist supination/pronation with soup can X20, table PROM ER stretch 5 sec X10    Consulted and Agree with Plan of Care Patient             Patient will benefit from skilled therapeutic intervention in order to improve the following deficits and impairments:  Pain, Postural dysfunction, Decreased strength, Increased edema, Other (comment), Decreased activity tolerance, Impaired UE functional use, Decreased range of motion, Impaired flexibility  Visit Diagnosis: Localized edema  Muscle weakness (generalized)  Acute pain of right shoulder  Stiffness of right shoulder, not elsewhere classified     Problem List Patient Active Problem List   Diagnosis Date Noted   History of prostate cancer 11/11/2020   Right shoulder pain 09/02/2020   History of hyperlipidemia, mixed 12/15/2017   Arthritis 12/14/2016   Gastroesophageal reflux disease without esophagitis 12/14/2016    Debbe Odea, PT,DPT 12/26/2020, 3:03 PM  [facility] Physical Therapy [address] [place], [place], [address] Phone: [phone]   Fax:  [phone]  Name: Daniel Barber MRN: [id number] Date of Birth: 1950/12/21

## 2021-01-03 ENCOUNTER — Telehealth: Payer: Self-pay | Admitting: Orthopaedic Surgery

## 2021-01-03 NOTE — Telephone Encounter (Signed)
Called pt to r/s appt with blackman

## 2021-01-07 ENCOUNTER — Ambulatory Visit (INDEPENDENT_AMBULATORY_CARE_PROVIDER_SITE_OTHER): Payer: Medicare Other | Admitting: Physical Therapy

## 2021-01-07 ENCOUNTER — Encounter: Payer: Self-pay | Admitting: Physical Therapy

## 2021-01-07 ENCOUNTER — Other Ambulatory Visit: Payer: Self-pay

## 2021-01-07 DIAGNOSIS — R600 Localized edema: Secondary | ICD-10-CM

## 2021-01-07 DIAGNOSIS — M25611 Stiffness of right shoulder, not elsewhere classified: Secondary | ICD-10-CM

## 2021-01-07 DIAGNOSIS — M25511 Pain in right shoulder: Secondary | ICD-10-CM

## 2021-01-07 DIAGNOSIS — M6281 Muscle weakness (generalized): Secondary | ICD-10-CM | POA: Diagnosis not present

## 2021-01-07 NOTE — Therapy (Signed)
[facility] Physical Therapy [address] [place], [place], [address] Phone: [phone]   Fax:  [phone]  Physical Therapy Treatment  Patient Details  Name: Daniel Barber MRN: [id number] Date of Birth: 1950-02-26 Referring Provider (PT): Jean Rosenthal MD   Encounter Date: 01/07/2021   PT End of Session - 01/07/21 1357     Visit Number 5    Number of Visits 17    Date for PT Re-Evaluation 02/14/21    Authorization Type KX modifier after visit 15    Progress Note Due on Visit 10    PT Start Time 4235    PT Stop Time 1430    PT Time Calculation (min) 42 min    Activity Tolerance Patient tolerated treatment well    Behavior During Therapy [facility] for tasks assessed/performed             Past Medical History:  Diagnosis Date   Arthritis    COPD, mild (HCC)    External hemorrhoids    symptomatic   GERD (gastroesophageal reflux disease)    History of adenomatous polyp of colon followed by dr Watt Climes   tubular adenoma's 1995;  2000;  2006;  2016   History of malignant melanoma of skin 2017   excision posterior neck (localized)   History of prostate cancer followed by pcp   1999--  radical prostatectomy/  per pt last PSA less than 0.1 on May 2017   Nocturia    Wears glasses     Past Surgical History:  Procedure Laterality Date   COLONOSCOPY  last one 07-27-2014  dr Watt Climes   ELBOW SURGERY Right 1989   HEMORRHOID SURGERY N/A 08/28/2016   Procedure: HEMORRHOIDECTOMY;  Surgeon: Clovis Riley, MD;  Location: [place];  Service: General;  Laterality: N/A;   KNEE ARTHROSCOPY Left 2002  approx.   MELANOMA EXCISION  2017   posterior neck (localized)   PROSTATECTOMY  1999   dr Risa Grill    There were no vitals filed for this visit.   Subjective Assessment - 01/07/21 1356     Subjective Pt arriving today reporting 1-2/10 pain. Pt stating it's more irritating, but he can get a snag from time to time.    Pertinent History  Prostate Cancer, GERD, Arthritis, COPD, knee arthroscopy left 2002    Diagnostic tests Pre images:   1. Severe tendinosis of the supraspinatus tendon with a large  full-thickness tear measuring 2 cm in anterior-posterior dimension.  2. Moderate tendinosis of the infraspinatus tendon.  3. Moderate tendinosis of the intra-articular portion of the long  head of the biceps tendon.  4. Mild partial-thickness cartilage loss of the posterior glenoid  with subchondral reactive marrow changes.    Currently in Pain? Yes    Pain Score 2     Pain Location Shoulder    Pain Orientation Right    Pain Descriptors / Indicators Aching;Sore    Pain Type Surgical pain    Pain Onset 1 to 4 weeks ago                [facility] PT Assessment - 01/07/21 0001       PROM   Overall PROM Comments measurements in supine    Right/Left Shoulder Right    Right Shoulder Flexion 80 Degrees    Right Shoulder ABduction 75 Degrees    Right Shoulder Internal Rotation 55 Degrees    Right Shoulder External Rotation 45 Degrees  [place] Adult PT Treatment/Exercise - 01/07/21 0001       Shoulder Exercises: Seated   Retraction Both;20 reps    Retraction Limitations with hands supported in lap    Other Seated Exercises blue digi grip 20 X2    Other Seated Exercises seated wrist flexion, extension, and supination to pronation all with 3# weight X 20 ea with shoulder/elbow supported on table      Shoulder Exercises: Standing   Other Standing Exercises pendulums X 20 circles CW and CCW    Other Standing Exercises elbow AROM X20 on Rt      Modalities   Modalities Vasopneumatic      Vasopneumatic   Number Minutes Vasopneumatic  10 minutes    Vasopnuematic Location  Shoulder    Vasopneumatic Pressure Medium    Vasopneumatic Temperature  34      Manual Therapy   Manual therapy comments Rt shoulder PROM to tolerance                       PT Short Term Goals -  01/07/21 1417       PT SHORT TERM GOAL #1   Title Pt will be independent in his initial HEP.    Status Achieved      PT SHORT TERM GOAL #2   Title Pt will be able to improve his passive right shoulder flexion to 90 degrees with pain </= 4/10.    Baseline 80 degrrees with increasd pain on 01/07/2021 passively               PT Long Term Goals - 12/18/20 1215       PT LONG TERM GOAL #1   Title Pt will improve his FOTO to >/= 63%    Baseline 30% on 12/18/2020    Time 8    Period Weeks    Status New    Target Date 02/14/21      PT LONG TERM GOAL #2   Title Pt will  improve his right shoulder flexion to >/= 140 degrees.    Time 8    Period Weeks    Status New    Target Date 02/14/21      PT LONG TERM GOAL #3   Title Pt will improve right passive ER to >/= 65 degrees.    Time 8    Period Weeks    Target Date 02/14/21      PT LONG TERM GOAL #4   Title Pt will be independent in his advanced HEP following protocol progression.    Time 8    Period Weeks    Target Date 02/14/21      PT LONG TERM GOAL #5   Title Pt will be able to report improved sleep by 50%.    Time 8    Period Weeks    Status New    Target Date 02/14/21                   Plan - 01/07/21 1415     Clinical Impression Statement Pt tolreating passive exercises well. Pt still guarding with end ranges.  Pt stating Dr. Ninfa Linden discontinued the use of his sling. Pt reporting compiance in his HEP. Continue skilled PT per protocol.    Personal Factors and Comorbidities Comorbidity 3+    Examination-Activity Limitations Lift;Dressing;Carry;Transfers;Other;Bathing;Sleep    Stability/Clinical Decision Making Stable/Uncomplicated    PT Frequency 2x / week    PT Duration 8 weeks  PT Treatment/Interventions ADLs/Self Care Home Management;Cryotherapy;Electrical Stimulation;Iontophoresis 4mg /ml Dexamethasone;Ultrasound;Balance training;Therapeutic exercise;Therapeutic activities;Functional mobility  training;Stair training;Gait training;Neuromuscular re-education;Patient/family education;Manual techniques;Passive range of motion;Dry needling;Taping;Vasopneumatic Device;Joint Manipulations;Moist Heat    PT Next Visit Plan Slow progression of rotator cuff protocol per Dr. Ninfa Linden, PROM, Vasopneumatic    PT Home Exercise Plan Access Code: [email or web address]  URL: [email or web address]  Date: 12/18/2020  Prepared by: Kearney Hard    Exercises  Circular Shoulder Pendulum with Table Support - 3-4 x daily - 7 x weekly  Flexion-Extension Shoulder Pendulum with Table Support - 3-4 x daily - 7 x weekly  Horizontal Shoulder Pendulum with Table Support - 3-4 x daily - 7 x weekly  Forearm Strengthening with Ball Squeeze - 3-4 x daily - 7 x weekly - 2 sets - 10 reps  Seated Biceps Curl - 3-4 x daily - 7 x weekly - 2 sets - 10 reps  Seated Shoulder Flexion Towel Slide at Table Top - 3-4 x daily - 7 x weekly - 2 sets - 10 reps. Added scap retraction X20, wrist supination/pronation with soup can X20, table PROM ER stretch 5 sec X10    Consulted and Agree with Plan of Care Patient             Patient will benefit from skilled therapeutic intervention in order to improve the following deficits and impairments:  Pain, Postural dysfunction, Decreased strength, Increased edema, Other (comment), Decreased activity tolerance, Impaired UE functional use, Decreased range of motion, Impaired flexibility  Visit Diagnosis: Localized edema  Muscle weakness (generalized)  Acute pain of right shoulder  Stiffness of right shoulder, not elsewhere classified     Problem List Patient Active Problem List   Diagnosis Date Noted   History of prostate cancer 11/11/2020   Right shoulder pain 09/02/2020   History of hyperlipidemia, mixed 12/15/2017   Arthritis 12/14/2016   Gastroesophageal reflux disease without esophagitis 12/14/2016    Oretha Caprice, PT, MPT 01/07/2021, 2:18 PM  [facility]  Physical Therapy [address] [place], [place], [address] Phone: [phone]   Fax:  [phone]  Name: Daniel Barber MRN: [id number] Date of Birth: [birth year]

## 2021-01-09 ENCOUNTER — Other Ambulatory Visit: Payer: Self-pay

## 2021-01-09 ENCOUNTER — Ambulatory Visit (INDEPENDENT_AMBULATORY_CARE_PROVIDER_SITE_OTHER): Payer: Medicare Other | Admitting: Rehabilitative and Restorative Service Providers"

## 2021-01-09 ENCOUNTER — Encounter: Payer: Self-pay | Admitting: Rehabilitative and Restorative Service Providers"

## 2021-01-09 DIAGNOSIS — M25611 Stiffness of right shoulder, not elsewhere classified: Secondary | ICD-10-CM

## 2021-01-09 DIAGNOSIS — M6281 Muscle weakness (generalized): Secondary | ICD-10-CM | POA: Diagnosis not present

## 2021-01-09 DIAGNOSIS — R600 Localized edema: Secondary | ICD-10-CM

## 2021-01-09 DIAGNOSIS — M25511 Pain in right shoulder: Secondary | ICD-10-CM | POA: Diagnosis not present

## 2021-01-09 NOTE — Therapy (Signed)
[facility] [address] [place], [place], [address] Phone: [phone]   Fax:  [phone]  Physical Therapy Treatment  Patient Details  Name: Daniel Barber MRN: [id number] Date of Birth: 01-13-1951 Referring Provider (PT): Jean Rosenthal MD   Encounter Date: 01/09/2021   PT End of Session - 01/09/21 1217     Visit Number 6    Number of Visits 17    Date for PT Re-Evaluation 02/14/21    Authorization Type KX modifier after visit 15    Progress Note Due on Visit 10    PT Start Time 1104    PT Stop Time 9163    PT Time Calculation (min) 50 min    Activity Tolerance Patient tolerated treatment well    Behavior During Therapy [facility] for tasks assessed/performed             Past Medical History:  Diagnosis Date   Arthritis    COPD, mild (HCC)    External hemorrhoids    symptomatic   GERD (gastroesophageal reflux disease)    History of adenomatous polyp of colon followed by dr Watt Climes   tubular adenoma's 1995;  2000;  2006;  2016   History of malignant melanoma of skin 2017   excision posterior neck (localized)   History of prostate cancer followed by pcp   1999--  radical prostatectomy/  per pt last PSA less than 0.1 on May 2017   Nocturia    Wears glasses     Past Surgical History:  Procedure Laterality Date   COLONOSCOPY  last one 07-27-2014  dr Watt Climes   ELBOW SURGERY Right 1989   HEMORRHOID SURGERY N/A 08/28/2016   Procedure: HEMORRHOIDECTOMY;  Surgeon: Clovis Riley, MD;  Location: [place];  Service: General;  Laterality: N/A;   KNEE ARTHROSCOPY Left 2002  approx.   MELANOMA EXCISION  2017   posterior neck (localized)   PROSTATECTOMY  1999   dr Risa Grill    There were no vitals filed for this visit.   Subjective Assessment - 01/09/21 1115     Subjective Daniel Barber reports over the counter pain medication 2X/day.  No ice, heat or prescription pain meds.    Pertinent History Prostate Cancer, GERD,  Arthritis, COPD, knee arthroscopy left 2002    Diagnostic tests Pre images:   1. Severe tendinosis of the supraspinatus tendon with a large  full-thickness tear measuring 2 cm in anterior-posterior dimension.  2. Moderate tendinosis of the infraspinatus tendon.  3. Moderate tendinosis of the intra-articular portion of the long  head of the biceps tendon.  4. Mild partial-thickness cartilage loss of the posterior glenoid  with subchondral reactive marrow changes.    Patient Stated Goals get back to fishing, working on old cars, gardening and shooting    Currently in Pain? Yes    Pain Score 3     Pain Location Shoulder    Pain Orientation Right    Pain Descriptors / Indicators Aching;Sore;Tightness    Pain Type Surgical pain    Pain Radiating Towards Anterior and lateral arm    Pain Onset More than a month ago    Pain Frequency Intermittent    Aggravating Factors  Sleeping and overuse    Pain Relieving Factors Ice and Aleve    Effect of Pain on Daily Activities Unable to fish, garden and shoot    Multiple Pain Sites No  [facility] Adult PT Treatment/Exercise - 01/09/21 0001       Shoulder Exercises: Seated   Retraction Both;20 reps    Retraction Limitations 5 seconds    Other Seated Exercises blue digi grip 20 X2      Shoulder Exercises: Standing   Other Standing Exercises pendulums X 20 circles CW and CCW    Other Standing Exercises elbow AROM X20 on Rt      Modalities   Modalities Vasopneumatic      Vasopneumatic   Number Minutes Vasopneumatic  10 minutes    Vasopnuematic Location  Shoulder    Vasopneumatic Pressure Medium    Vasopneumatic Temperature  34      Manual Therapy   Manual therapy comments Rt shoulder PROM to comfort                     PT Education - 01/09/21 1216     Education Details Reviewed HEP including some exercises that we did not have time for in the clinic.    Person(s) Educated Patient     Methods Explanation;Demonstration;Verbal cues;Tactile cues    Comprehension Verbalized understanding;Tactile cues required;Returned demonstration;Need further instruction;Verbal cues required              PT Short Term Goals - 01/07/21 1417       PT SHORT TERM GOAL #1   Title Pt will be independent in his initial HEP.    Status Achieved      PT SHORT TERM GOAL #2   Title Pt will be able to improve his passive right shoulder flexion to 90 degrees with pain </= 4/10.    Baseline 80 degrrees with increasd pain on 01/07/2021 passively               PT Long Term Goals - 12/18/20 1215       PT LONG TERM GOAL #1   Title Pt will improve his FOTO to >/= 63%    Baseline 30% on 12/18/2020    Time 8    Period Weeks    Status New    Target Date 02/14/21      PT LONG TERM GOAL #2   Title Pt will  improve his right shoulder flexion to >/= 140 degrees.    Time 8    Period Weeks    Status New    Target Date 02/14/21      PT LONG TERM GOAL #3   Title Pt will improve right passive ER to >/= 65 degrees.    Time 8    Period Weeks    Target Date 02/14/21      PT LONG TERM GOAL #4   Title Pt will be independent in his advanced HEP following protocol progression.    Time 8    Period Weeks    Target Date 02/14/21      PT LONG TERM GOAL #5   Title Pt will be able to report improved sleep by 50%.    Time 8    Period Weeks    Status New    Target Date 02/14/21                   Plan - 01/09/21 1217     Clinical Impression Statement Daniel Barber reports good HEP compliance and adequate pain management.  Guarding is noted with PROM and we stayed within comfortable ranges although he was encouraged to try to get as much range on his own and with  his HEP as possible to reduce the risk of future adhesive capsulitis.    Personal Factors and Comorbidities Comorbidity 3+    Comorbidities prostate CA, COPD, arthritis, GERD, left knee surgery    Examination-Activity Limitations  Lift;Dressing;Carry;Transfers;Other;Bathing;Sleep    Stability/Clinical Decision Making Stable/Uncomplicated    Rehab Potential Good    PT Frequency 2x / week    PT Duration 8 weeks    PT Treatment/Interventions ADLs/Self Care Home Management;Cryotherapy;Electrical Stimulation;Iontophoresis 4mg /ml Dexamethasone;Ultrasound;Balance training;Therapeutic exercise;Therapeutic activities;Functional mobility training;Stair training;Gait training;Neuromuscular re-education;Patient/family education;Manual techniques;Passive range of motion;Dry needling;Taping;Vasopneumatic Device;Joint Manipulations;Moist Heat    PT Next Visit Plan Slow progression of rotator cuff protocol per Dr. Ninfa Linden, PROM, Vasopneumatic    PT Home Exercise Plan Access Code: [email or web address]  URL: [email or web address]  Date: 12/18/2020  Prepared by: Kearney Hard    Exercises  Circular Shoulder Pendulum with Table Support - 3-4 x daily - 7 x weekly  Flexion-Extension Shoulder Pendulum with Table Support - 3-4 x daily - 7 x weekly  Horizontal Shoulder Pendulum with Table Support - 3-4 x daily - 7 x weekly  Forearm Strengthening with Ball Squeeze - 3-4 x daily - 7 x weekly - 2 sets - 10 reps  Seated Biceps Curl - 3-4 x daily - 7 x weekly - 2 sets - 10 reps  Seated Shoulder Flexion Towel Slide at Table Top - 3-4 x daily - 7 x weekly - 2 sets - 10 reps. Added scap retraction X20, wrist supination/pronation with soup can X20, table PROM ER stretch 5 sec X10    Consulted and Agree with Plan of Care Patient             Patient will benefit from skilled therapeutic intervention in order to improve the following deficits and impairments:  Pain, Postural dysfunction, Decreased strength, Increased edema, Other (comment), Decreased activity tolerance, Impaired UE functional use, Decreased range of motion, Impaired flexibility  Visit Diagnosis: Stiffness of right shoulder, not elsewhere classified  Muscle weakness  (generalized)  Localized edema  Acute pain of right shoulder     Problem List Patient Active Problem List   Diagnosis Date Noted   History of prostate cancer 11/11/2020   Right shoulder pain 09/02/2020   History of hyperlipidemia, mixed 12/15/2017   Arthritis 12/14/2016   Gastroesophageal reflux disease without esophagitis 12/14/2016    Farley Ly, PT, MPT 01/09/2021, 12:20 PM  [facility] Physical Therapy [address] [place], [place], [address] Phone: [phone]   Fax:  [phone]  Name: Daniel Barber MRN: [id number] Date of Birth: [birth year]

## 2021-01-09 NOTE — Patient Instructions (Signed)
Access Code: [email or web address] URL: [email or web address] Date: 01/09/2021 Prepared by: Vista Mink  Exercises Circular Shoulder Pendulum with Table Support - 3-4 x daily - 7 x weekly Flexion-Extension Shoulder Pendulum with Table Support - 3-4 x daily - 7 x weekly Horizontal (limited range) Shoulder Pendulum with Table Support - 3-4 x daily - 7 x weekly Forearm Strengthening with Ball Squeeze - 3-4 x daily - 7 x weekly - 2 sets - 10 reps Seated Biceps Curl - 3-4 x daily - 7 x weekly - 2 sets - 10 reps Seated Shoulder Flexion Towel Slide at Table Top - 3-4 x daily - 7 x weekly - 2 sets - 10 reps Seated Shoulder External Rotation PROM on Table - 2-3 x daily - 6 x weekly - 1-2 sets - 10 reps - 5 hold Seated Scapular Retraction - 2 x daily - 6 x weekly - 1 sets - 20 reps Seated Wrist Supination Pronation with Can - 2 x daily - 6 x weekly - 1 sets - 20 reps

## 2021-01-16 ENCOUNTER — Encounter: Payer: Self-pay | Admitting: Rehabilitative and Restorative Service Providers"

## 2021-01-16 ENCOUNTER — Ambulatory Visit (INDEPENDENT_AMBULATORY_CARE_PROVIDER_SITE_OTHER): Payer: Medicare Other | Admitting: Rehabilitative and Restorative Service Providers"

## 2021-01-16 ENCOUNTER — Other Ambulatory Visit: Payer: Self-pay

## 2021-01-16 DIAGNOSIS — R600 Localized edema: Secondary | ICD-10-CM

## 2021-01-16 DIAGNOSIS — M25611 Stiffness of right shoulder, not elsewhere classified: Secondary | ICD-10-CM | POA: Diagnosis not present

## 2021-01-16 DIAGNOSIS — M6281 Muscle weakness (generalized): Secondary | ICD-10-CM | POA: Diagnosis not present

## 2021-01-16 DIAGNOSIS — M25511 Pain in right shoulder: Secondary | ICD-10-CM

## 2021-01-16 NOTE — Therapy (Signed)
[facility] Therapy [address] [place], [place], [address] Phone: [phone]   Fax:  [phone]  Physical Therapy Treatment/Progress Note  Patient Details  Name: Daniel Barber MRN: [id number] Date of Birth: Apr 10, 1950 Referring Provider (PT): Jean Rosenthal MD  Progress Note Reporting Period 12/18/2020 to 01/16/2021  See note below for Objective Data and Assessment of Progress/Goals.     Encounter Date: 01/16/2021   PT End of Session - 01/16/21 1314     Visit Number 7    Number of Visits 17    Date for PT Re-Evaluation 02/14/21    Authorization Type KX modifier after visit 15    Progress Note Due on Visit 17    PT Start Time 1100    PT Stop Time 1145    PT Time Calculation (min) 45 min    Activity Tolerance Patient tolerated treatment well;No increased pain    Behavior During Therapy [facility] for tasks assessed/performed             Past Medical History:  Diagnosis Date   Arthritis    COPD, mild (HCC)    External hemorrhoids    symptomatic   GERD (gastroesophageal reflux disease)    History of adenomatous polyp of colon followed by dr Watt Climes   tubular adenoma's 1995;  2000;  2006;  2016   History of malignant melanoma of skin 2017   excision posterior neck (localized)   History of prostate cancer followed by pcp   1999--  radical prostatectomy/  per pt last PSA less than 0.1 on May 2017   Nocturia    Wears glasses     Past Surgical History:  Procedure Laterality Date   COLONOSCOPY  last one 07-27-2014  dr Watt Climes   ELBOW SURGERY Right 1989   HEMORRHOID SURGERY N/A 08/28/2016   Procedure: HEMORRHOIDECTOMY;  Surgeon: Clovis Riley, MD;  Location: [place];  Service: General;  Laterality: N/A;   KNEE ARTHROSCOPY Left 2002  approx.   MELANOMA EXCISION  2017   posterior neck (localized)   PROSTATECTOMY  1999   dr Risa Grill    There were no vitals filed for this visit.   Subjective Assessment -  01/16/21 1102     Subjective Daniel Barber reports over the counter pain medication (Aleve) 2X/day.  No ice, heat or prescription pain meds.  He is now able to sleep through the night.    Pertinent History Prostate Cancer, GERD, Arthritis, COPD, knee arthroscopy left 2002    Diagnostic tests Pre images:   1. Severe tendinosis of the supraspinatus tendon with a large  full-thickness tear measuring 2 cm in anterior-posterior dimension.  2. Moderate tendinosis of the infraspinatus tendon.  3. Moderate tendinosis of the intra-articular portion of the long  head of the biceps tendon.  4. Mild partial-thickness cartilage loss of the posterior glenoid  with subchondral reactive marrow changes.    Patient Stated Goals get back to fishing, working on old cars, gardening and shooting    Currently in Pain? Yes    Pain Score 2     Pain Location Shoulder    Pain Orientation Right    Pain Descriptors / Indicators Aching    Pain Type Surgical pain    Pain Radiating Towards Anterior and lateral arm    Pain Onset More than a month ago    Pain Frequency Occasional    Aggravating Factors  Reaching    Pain Relieving Factors Aleve    Effect of Pain on  Daily Activities Unable to fish, garden and shoot    Multiple Pain Sites No                [facility] PT Assessment - 01/16/21 0001       ROM / Strength   AROM / PROM / Strength PROM      PROM   Overall PROM Comments Measured in supine    PROM Assessment Site Shoulder    Right/Left Shoulder Right    Right Shoulder Flexion 95 Degrees    Right Shoulder Internal Rotation 45 Degrees    Right Shoulder External Rotation 70 Degrees                           [facility] Adult PT Treatment/Exercise - 01/16/21 0001       Exercises   Exercises Shoulder      Shoulder Exercises: Supine   Protraction AAROM;Right;10 reps;Limitations    Protraction Limitations 3 seconds, Reach up towards ceiling palm in with L side helping    External Rotation AAROM;Right;10  reps;Limitations    External Rotation Limitations 10 seconds at 70 degrees abduction and 3 pillows under elbow in supine    Internal Rotation AAROM;Right;10 reps;Limitations    Internal Rotation Limitations 10 seconds at 70 degrees abduction elbow on 3 pillows    Flexion AAROM;Right;20 reps;Limitations    Flexion Limitations L side helps, protract 1st (in comfortable range)      Shoulder Exercises: Seated   Retraction Strengthening;Both;10 reps;Limitations    Retraction Limitations 5 seconds      Shoulder Exercises: Standing   Other Standing Exercises pendulums X 20 circles CW and CCW    Other Standing Exercises elbow AROM X20 on Rt      Shoulder Exercises: Pulleys   Flexion Limitations    Flexion Limitations Palm in, protract 1st in painfree range 10X 5 seconds      Modalities   Modalities Cryotherapy      Cryotherapy   Number Minutes Cryotherapy 5 Minutes    Cryotherapy Location Shoulder    Type of Cryotherapy Ice pack      Manual Therapy   Manual therapy comments Rt shoulder PROM to comfort (IR, ER, flexion)                     PT Education - 01/16/21 1313     Education Details Reviewed and updated HEP with addion of supine arm raises (AAROM) and supine IR/ER AAROM (gravity assisted).    Person(s) Educated Patient    Methods Explanation;Demonstration;Tactile cues;Verbal cues;Handout    Comprehension Verbalized understanding;Tactile cues required;Need further instruction;Returned demonstration;Verbal cues required              PT Short Term Goals - 01/16/21 1315       PT SHORT TERM GOAL #1   Title Pt will be independent in his initial HEP.    Status Achieved      PT SHORT TERM GOAL #2   Title Pt will be able to improve his passive right shoulder flexion to 90 degrees with pain </= 4/10.    Baseline Met    Status Achieved               PT Long Term Goals - 01/16/21 1315       PT LONG TERM GOAL #1   Title Pt will improve his FOTO to >/=  63%    Baseline 53 today (was 30 at evaluation)  Time 8    Period Weeks    Status On-going    Target Date 02/14/21      PT LONG TERM GOAL #2   Title Pt will  improve his right shoulder flexion to >/= 140 degrees.    Baseline 95 passively    Time 8    Period Weeks    Status On-going    Target Date 02/14/21      PT LONG TERM GOAL #3   Title Pt will improve right passive ER to >/= 65 degrees.    Baseline Met at 70 degrees    Time 8    Period Weeks    Status Achieved    Target Date 02/14/21      PT LONG TERM GOAL #4   Title Pt will be independent in his advanced HEP following protocol progression.    Baseline Is being progressed    Time 8    Period Weeks    Status On-going    Target Date 02/14/21      PT LONG TERM GOAL #5   Title Pt will be able to report improved sleep by 50%.    Baseline Sleep is now uninterrupted    Time 8    Period Weeks    Status Achieved    Target Date 02/14/21                   Plan - 01/16/21 1317     Clinical Impression Statement Daniel Barber is making good progress towards long-term goals.  PT thus far has been focused on improving range and capsular flexibility (PROM and AAROM).  AROM will be progressed next week with strength likely delayed until well after Daniel Barber's follow-up with Dr. Ninfa Linden in January (week 10 post-surgery is the earliest I expect to start easing into strength).  Strengthening can be delayed or slightly sped-up per Dr. Trevor Mace recommendations.    Personal Factors and Comorbidities Comorbidity 3+    Comorbidities prostate CA, COPD, arthritis, GERD, left knee surgery    Examination-Activity Limitations Lift;Dressing;Carry;Transfers;Other;Bathing;Sleep    Stability/Clinical Decision Making Stable/Uncomplicated    Rehab Potential Good    PT Frequency 2x / week    PT Duration 4 weeks    PT Treatment/Interventions ADLs/Self Care Home Management;Cryotherapy;Electrical Stimulation;Iontophoresis 63m/ml  Dexamethasone;Ultrasound;Balance training;Therapeutic exercise;Therapeutic activities;Functional mobility training;Stair training;Gait training;Neuromuscular re-education;Patient/family education;Manual techniques;Passive range of motion;Dry needling;Taping;Vasopneumatic Device;Joint Manipulations;Moist Heat    PT Next Visit Plan PROM, AAROM just started.    PT Home Exercise Plan Access Code: [email or web address] URL: [email or web address]  Date: 12/18/2020  Prepared by: JKearney Hard   Exercises  Circular Shoulder Pendulum with Table Support - 3-4 x daily - 7 x weekly  Flexion-Extension Shoulder Pendulum with Table Support - 3-4 x daily - 7 x weekly  Horizontal Shoulder Pendulum with Table Support - 3-4 x daily - 7 x weekly  Forearm Strengthening with Ball Squeeze - 3-4 x daily - 7 x weekly - 2 sets - 10 reps  Seated Biceps Curl - 3-4 x daily - 7 x weekly - 2 sets - 10 reps  Seated Shoulder Flexion Towel Slide at Table Top - 3-4 x daily - 7 x weekly - 2 sets - 10 reps. Added scap retraction X20, wrist supination/pronation with soup can X20, table PROM ER stretch 5 sec X10    Consulted and Agree with Plan of Care Patient             Patient will benefit from skilled therapeutic intervention in  order to improve the following deficits and impairments:  Pain, Postural dysfunction, Decreased strength, Increased edema, Other (comment), Decreased activity tolerance, Impaired UE functional use, Decreased range of motion, Impaired flexibility  Visit Diagnosis: Stiffness of right shoulder, not elsewhere classified  Acute pain of right shoulder  Muscle weakness (generalized)  Localized edema     Problem List Patient Active Problem List   Diagnosis Date Noted   History of prostate cancer 11/11/2020   Right shoulder pain 09/02/2020   History of hyperlipidemia, mixed 12/15/2017   Arthritis 12/14/2016   Gastroesophageal reflux disease without esophagitis 12/14/2016    Farley Ly, PT,  MPT 01/16/2021, 4:44 PM  [facility] [address] [place], [place], [address] Phone: [phone]   Fax:  [phone]  Name: Daniel Barber MRN: [id number] Date of Birth: 28-Dec-1950

## 2021-01-16 NOTE — Patient Instructions (Addendum)
Access Code: [email or web address] URL: [email or web address] Date: 01/16/2021 Prepared by: Vista Mink  Exercises Circular Shoulder Pendulum with Table Support - 3 x daily - 7 x weekly Flexion-Extension Shoulder Pendulum with Table Support - 3 x daily - 7 x weekly Horizontal Shoulder Pendulum with Table Support - 3 x daily - 7 x weekly Forearm Strengthening with Ball Squeeze - 2-3 x daily - 7 x weekly - 2 sets - 10 reps Seated Biceps Curl - 2-3 x daily - 7 x weekly - 2 sets - 10 reps Seated Shoulder Flexion Towel Slide at Table Top (Palm in)- 3 x daily - 7 x weekly - 2 sets - 10 reps Seated Shoulder External Rotation PROM on Table - 3 x daily - 7 x weekly - 1-2 sets - 10 reps - 5 hold Seated Scapular Retraction - 2 x daily - 6 x weekly - 1 sets - 20 reps Supine Scapular Protraction in Flexion AAROM - 2-3 x daily - 7 x weekly - 1 sets - 20 reps - 3 seconds hold Supine Shoulder Internal Rotation AAROM - 2-3 x daily - 7 x weekly - 1 sets - 10-20 reps - 10 seconds hold Supine Shoulder External Rotation AAROM - 2-3 x daily - 7 x weekly - 1 sets - 10-20 reps - 10 seconds hold

## 2021-01-19 ENCOUNTER — Encounter: Payer: Self-pay | Admitting: Orthopaedic Surgery

## 2021-01-21 ENCOUNTER — Encounter: Payer: Self-pay | Admitting: Physical Therapy

## 2021-01-21 ENCOUNTER — Other Ambulatory Visit: Payer: Self-pay

## 2021-01-21 ENCOUNTER — Ambulatory Visit (INDEPENDENT_AMBULATORY_CARE_PROVIDER_SITE_OTHER): Payer: Medicare Other | Admitting: Physical Therapy

## 2021-01-21 ENCOUNTER — Ambulatory Visit: Payer: Medicare Other | Admitting: Orthopaedic Surgery

## 2021-01-21 DIAGNOSIS — R600 Localized edema: Secondary | ICD-10-CM | POA: Diagnosis not present

## 2021-01-21 DIAGNOSIS — M25511 Pain in right shoulder: Secondary | ICD-10-CM | POA: Diagnosis not present

## 2021-01-21 DIAGNOSIS — M6281 Muscle weakness (generalized): Secondary | ICD-10-CM | POA: Diagnosis not present

## 2021-01-21 DIAGNOSIS — M25611 Stiffness of right shoulder, not elsewhere classified: Secondary | ICD-10-CM

## 2021-01-21 NOTE — Therapy (Signed)
[facility] [address] [place], [place], [address] Phone: [phone]   Fax:  [phone]  Physical Therapy Treatment  Patient Details  Name: Daniel Barber MRN: [id number] Date of Birth: 1950/11/18 Referring Provider (PT): Jean Rosenthal MD   Encounter Date: 01/21/2021   PT End of Session - 01/21/21 1007     Visit Number 8    Number of Visits 17    Date for PT Re-Evaluation 02/14/21    Authorization Type KX modifier after visit 15    Progress Note Due on Visit 17    PT Start Time 0936    PT Stop Time 1015    PT Time Calculation (min) 39 min    Activity Tolerance Patient tolerated treatment well;No increased pain    Behavior During Therapy [facility] for tasks assessed/performed             Past Medical History:  Diagnosis Date   Arthritis    COPD, mild (HCC)    External hemorrhoids    symptomatic   GERD (gastroesophageal reflux disease)    History of adenomatous polyp of colon followed by dr Watt Climes   tubular adenoma's 1995;  2000;  2006;  2016   History of malignant melanoma of skin 2017   excision posterior neck (localized)   History of prostate cancer followed by pcp   1999--  radical prostatectomy/  per pt last PSA less than 0.1 on May 2017   Nocturia    Wears glasses     Past Surgical History:  Procedure Laterality Date   COLONOSCOPY  last one 07-27-2014  dr Watt Climes   ELBOW SURGERY Right 1989   HEMORRHOID SURGERY N/A 08/28/2016   Procedure: HEMORRHOIDECTOMY;  Surgeon: Clovis Riley, MD;  Location: [place];  Service: General;  Laterality: N/A;   KNEE ARTHROSCOPY Left 2002  approx.   MELANOMA EXCISION  2017   posterior neck (localized)   PROSTATECTOMY  1999   dr Risa Grill    There were no vitals filed for this visit.   Subjective Assessment - 01/21/21 1005     Subjective Pt arriving reporting 2/10 pain in right shoulder. Pt stating he has been sleeping better.    Pertinent History Prostate  Cancer, GERD, Arthritis, COPD, knee arthroscopy left 2002    Limitations House hold activities;Other (comment)    Diagnostic tests Pre images:   1. Severe tendinosis of the supraspinatus tendon with a large  full-thickness tear measuring 2 cm in anterior-posterior dimension.  2. Moderate tendinosis of the infraspinatus tendon.  3. Moderate tendinosis of the intra-articular portion of the long  head of the biceps tendon.  4. Mild partial-thickness cartilage loss of the posterior glenoid  with subchondral reactive marrow changes.    Currently in Pain? Yes    Pain Score 2     Pain Location Shoulder    Pain Orientation Right    Pain Descriptors / Indicators Aching;Sore;Tightness    Pain Type Surgical pain    Pain Onset More than a month ago                [facility] PT Assessment - 01/21/21 0001       Assessment   Medical Diagnosis Z46.011D traumatic complete tear of rotator cuff    Referring Provider (PT) Jean Rosenthal MD    Onset Date/Surgical Date 12/05/20    Hand Dominance Right      ROM / Strength   AROM / PROM / Strength PROM  PROM   Overall PROM Comments supine measurement    PROM Assessment Site Shoulder    Right/Left Shoulder Right    Right Shoulder Flexion 100 Degrees    Right Shoulder ABduction 90 Degrees    Right Shoulder External Rotation 70 Degrees                           [facility] Adult PT Treatment/Exercise - 01/21/21 0001       Exercises   Exercises Shoulder      Shoulder Exercises: Supine   Protraction AAROM;Right;10 reps;Limitations    Protraction Limitations 3 seconds, Reach up towards ceiling palm in with L side helping    External Rotation AAROM;Right;10 reps;Limitations    External Rotation Limitations 5 second hold using 1# bar    Flexion AAROM;Right;20 reps;Limitations    Flexion Limitations holding 3 seconds, using 1# bar      Shoulder Exercises: Seated   Other Seated Exercises UE ranger flexion and scaption x 20 each       Shoulder Exercises: Standing   Other Standing Exercises pendulums X 20 circles CW and CCW      Shoulder Exercises: Pulleys   Flexion 2 minutes    Flexion Limitations painfree range      Modalities   Modalities Vasopneumatic      Vasopneumatic   Number Minutes Vasopneumatic  10 minutes    Vasopnuematic Location  Shoulder    Vasopneumatic Pressure Medium    Vasopneumatic Temperature  34      Manual Therapy   Manual therapy comments Rt shoulder PROM to comfort (IR, ER, flexion)                       PT Short Term Goals - 01/16/21 1315       PT SHORT TERM GOAL #1   Title Pt will be independent in his initial HEP.    Status Achieved      PT SHORT TERM GOAL #2   Title Pt will be able to improve his passive right shoulder flexion to 90 degrees with pain </= 4/10.    Baseline Met    Status Achieved               PT Long Term Goals - 01/21/21 1012       PT LONG TERM GOAL #1   Title Pt will improve his FOTO to >/= 63%    Baseline 53 today (was 30 at evaluation)    Status On-going      PT LONG TERM GOAL #2   Title Pt will  improve his right shoulder flexion to >/= 140 degrees.    Status On-going      PT LONG TERM GOAL #3   Title Pt will improve right passive ER to >/= 65 degrees.      PT LONG TERM GOAL #4   Title Pt will be independent in his advanced HEP following protocol progression.    Status On-going      PT LONG TERM GOAL #5   Title Pt will be able to report improved sleep by 50%.    Baseline Sleep is now uninterrupted    Status Achieved                   Plan - 01/21/21 1008     Clinical Impression Statement Pt arriving today reporting 2/10 pain in his right shoulder. Pt has made progress with PROM  measurements over the last 2 weeks. Pt began more AAROM at his last visit at 6 week mark. We will continue to progress and wait for f/u with Dr. Ninfa Linden before strengthening. Continue skilled PT.    Comorbidities prostate CA,  COPD, arthritis, GERD, left knee surgery    Examination-Activity Limitations Lift;Dressing;Carry;Transfers;Other;Bathing;Sleep    Stability/Clinical Decision Making Stable/Uncomplicated    Rehab Potential Good    PT Frequency 2x / week    PT Duration 4 weeks    PT Treatment/Interventions ADLs/Self Care Home Management;Cryotherapy;Electrical Stimulation;Iontophoresis 23m/ml Dexamethasone;Ultrasound;Balance training;Therapeutic exercise;Therapeutic activities;Functional mobility training;Stair training;Gait training;Neuromuscular re-education;Patient/family education;Manual techniques;Passive range of motion;Dry needling;Taping;Vasopneumatic Device;Joint Manipulations;Moist Heat    PT Next Visit Plan PROM, AAROM, hold on strengthening until f/u Dr. BLanice Shirts   PT Home Exercise Plan Access Code: [email or web address]  URL: [email or web address]  Date: 12/18/2020  Prepared by: JKearney Hard   Exercises  Circular Shoulder Pendulum with Table Support - 3-4 x daily - 7 x weekly  Flexion-Extension Shoulder Pendulum with Table Support - 3-4 x daily - 7 x weekly  Horizontal Shoulder Pendulum with Table Support - 3-4 x daily - 7 x weekly  Forearm Strengthening with Ball Squeeze - 3-4 x daily - 7 x weekly - 2 sets - 10 reps  Seated Biceps Curl - 3-4 x daily - 7 x weekly - 2 sets - 10 reps  Seated Shoulder Flexion Towel Slide at Table Top - 3-4 x daily - 7 x weekly - 2 sets - 10 reps. Added scap retraction X20, wrist supination/pronation with soup can X20, table PROM ER stretch 5 sec X10    Consulted and Agree with Plan of Care Patient             Patient will benefit from skilled therapeutic intervention in order to improve the following deficits and impairments:  Pain, Postural dysfunction, Decreased strength, Increased edema, Other (comment), Decreased activity tolerance, Impaired UE functional use, Decreased range of motion, Impaired flexibility  Visit Diagnosis: Stiffness of right shoulder, not  elsewhere classified  Acute pain of right shoulder  Muscle weakness (generalized)  Localized edema     Problem List Patient Active Problem List   Diagnosis Date Noted   History of prostate cancer 11/11/2020   Right shoulder pain 09/02/2020   History of hyperlipidemia, mixed 12/15/2017   Arthritis 12/14/2016   Gastroesophageal reflux disease without esophagitis 12/14/2016    JOretha Caprice PT, MPT 01/21/2021, 10:13 AM  [facility] [address] [address]Phone: [phone]  Fax:  [phone] Name: BGEORGIE HAQUEMRN: [id number] of Birth: 7Apr 08, 1952

## 2021-01-23 ENCOUNTER — Encounter: Payer: Medicare Other | Admitting: Rehabilitative and Restorative Service Providers"

## 2021-01-23 ENCOUNTER — Other Ambulatory Visit: Payer: Self-pay

## 2021-01-23 ENCOUNTER — Ambulatory Visit: Payer: Medicare Other | Admitting: Physician Assistant

## 2021-01-23 ENCOUNTER — Encounter: Payer: Self-pay | Admitting: Rehabilitative and Restorative Service Providers"

## 2021-01-23 ENCOUNTER — Ambulatory Visit (INDEPENDENT_AMBULATORY_CARE_PROVIDER_SITE_OTHER): Payer: Medicare Other | Admitting: Rehabilitative and Restorative Service Providers"

## 2021-01-23 DIAGNOSIS — M25511 Pain in right shoulder: Secondary | ICD-10-CM

## 2021-01-23 DIAGNOSIS — M6281 Muscle weakness (generalized): Secondary | ICD-10-CM

## 2021-01-23 DIAGNOSIS — M25611 Stiffness of right shoulder, not elsewhere classified: Secondary | ICD-10-CM | POA: Diagnosis not present

## 2021-01-23 DIAGNOSIS — R600 Localized edema: Secondary | ICD-10-CM

## 2021-01-23 NOTE — Therapy (Signed)
[facility] [address] [place], [place], [address] Phone: [phone]   Fax:  [phone]  Physical Therapy Treatment  Patient Details  Name: Daniel Barber MRN: [id number] Date of Birth: October 01, 1950 Referring Provider (PT): Jean Rosenthal MD   Encounter Date: 01/23/2021   PT End of Session - 01/23/21 1432     Visit Number 9    Number of Visits 17    Date for PT Re-Evaluation 02/14/21    Authorization Type KX modifier after visit 15    Progress Note Due on Visit 17    PT Start Time 6546    PT Stop Time 1427    PT Time Calculation (min) 50 min    Activity Tolerance Patient tolerated treatment well;No increased pain    Behavior During Therapy [facility] for tasks assessed/performed             Past Medical History:  Diagnosis Date   Arthritis    COPD, mild (HCC)    External hemorrhoids    symptomatic   GERD (gastroesophageal reflux disease)    History of adenomatous polyp of colon followed by dr Watt Climes   tubular adenoma's 1995;  2000;  2006;  2016   History of malignant melanoma of skin 2017   excision posterior neck (localized)   History of prostate cancer followed by pcp   1999--  radical prostatectomy/  per pt last PSA less than 0.1 on May 2017   Nocturia    Wears glasses     Past Surgical History:  Procedure Laterality Date   COLONOSCOPY  last one 07-27-2014  dr Watt Climes   ELBOW SURGERY Right 1989   HEMORRHOID SURGERY N/A 08/28/2016   Procedure: HEMORRHOIDECTOMY;  Surgeon: Clovis Riley, MD;  Location: [place];  Service: General;  Laterality: N/A;   KNEE ARTHROSCOPY Left 2002  approx.   MELANOMA EXCISION  2017   posterior neck (localized)   PROSTATECTOMY  1999   dr Risa Grill    There were no vitals filed for this visit.   Subjective Assessment - 01/23/21 1340     Subjective Tom reports no negative changes since I last saw him.  Sleeping is still good.    Pertinent History Prostate Cancer, GERD,  Arthritis, COPD, knee arthroscopy left 2002    Limitations House hold activities;Other (comment)    Diagnostic tests Pre images:   1. Severe tendinosis of the supraspinatus tendon with a large  full-thickness tear measuring 2 cm in anterior-posterior dimension.  2. Moderate tendinosis of the infraspinatus tendon.  3. Moderate tendinosis of the intra-articular portion of the long  head of the biceps tendon.  4. Mild partial-thickness cartilage loss of the posterior glenoid  with subchondral reactive marrow changes.    Currently in Pain? Yes    Pain Score 2     Pain Location Shoulder    Pain Orientation Right    Pain Descriptors / Indicators Tightness;Aching;Burning    Pain Type Surgical pain    Pain Radiating Towards Anetrior and lateral arm    Pain Onset More than a month ago    Pain Frequency Occasional    Aggravating Factors  Anything with elbow away from the body.    Pain Relieving Factors Aleve    Effect of Pain on Daily Activities Unable to fish, garden and shoot    Multiple Pain Sites No  [facility] Adult PT Treatment/Exercise - 01/23/21 0001       Exercises   Exercises Shoulder      Shoulder Exercises: Supine   Protraction AAROM;Right;20 reps;Limitations    Protraction Limitations 3 seconds, Reach up towards ceiling palm in with L side helping    External Rotation AAROM;Right;10 reps;Limitations    External Rotation Limitations 10 seconds at 70 degrees abduction and 3 pillows under elbow in supine    Internal Rotation AAROM;Right;10 reps;Limitations    Internal Rotation Limitations 10 seconds at 70 degrees abduction elbow on 3 pillows    Flexion AAROM;Right;20 reps;Limitations    Flexion Limitations L side helps, protract 1st (in comfortable range)      Shoulder Exercises: Seated   Retraction Strengthening;Both;10 reps;Limitations    Retraction Limitations 5 seconds      Shoulder Exercises: Standing   Other Standing Exercises  Elbow 20X 3 seconds      Shoulder Exercises: Pulleys   Flexion Limitations    Flexion Limitations Palm in, protract 1st in painfree range 10X 5 seconds    Scaption Limitations    Scaption Limitations 10X 5 seconds Palm in, reach out 1st      Modalities   Modalities Vasopneumatic      Vasopneumatic   Number Minutes Vasopneumatic  10 minutes    Vasopnuematic Location  Shoulder    Vasopneumatic Pressure Medium    Vasopneumatic Temperature  34      Manual Therapy   Manual therapy comments Rt shoulder PROM to comfort (IR, ER, flexion)                     PT Education - 01/23/21 1422     Education Details Reviewed HEP with addition of pulley scaption with decompress.    Person(s) Educated Patient    Methods Explanation;Demonstration;Verbal cues;Tactile cues;Handout    Comprehension Tactile cues required;Verbalized understanding;Returned demonstration;Need further instruction;Verbal cues required              PT Short Term Goals - 01/16/21 1315       PT SHORT TERM GOAL #1   Title Pt will be independent in his initial HEP.    Status Achieved      PT SHORT TERM GOAL #2   Title Pt will be able to improve his passive right shoulder flexion to 90 degrees with pain </= 4/10.    Baseline Met    Status Achieved               PT Long Term Goals - 01/21/21 1012       PT LONG TERM GOAL #1   Title Pt will improve his FOTO to >/= 63%    Baseline 53 today (was 30 at evaluation)    Status On-going      PT LONG TERM GOAL #2   Title Pt will  improve his right shoulder flexion to >/= 140 degrees.    Status On-going      PT LONG TERM GOAL #3   Title Pt will improve right passive ER to >/= 65 degrees.      PT LONG TERM GOAL #4   Title Pt will be independent in his advanced HEP following protocol progression.    Status On-going      PT LONG TERM GOAL #5   Title Pt will be able to report improved sleep by 50%.    Baseline Sleep is now uninterrupted    Status  Achieved  Plan - 01/23/21 1433     Clinical Impression Statement Gershon Mussel is making noticeable progress with his AAROM since the last time I saw him.  Pain continues to be low and sleep is no longer functionally limiting.  Continue AROM progressions with strength delayed until post follow-up with Dr. Ninfa Linden (post week 10 post-surgery at the earliest).    Personal Factors and Comorbidities Comorbidity 3+    Comorbidities prostate CA, COPD, arthritis, GERD, left knee surgery    Examination-Activity Limitations Lift;Dressing;Carry;Transfers;Other;Bathing;Sleep    Stability/Clinical Decision Making Stable/Uncomplicated    Rehab Potential Good    PT Frequency 2x / week    PT Duration 4 weeks    PT Treatment/Interventions ADLs/Self Care Home Management;Cryotherapy;Electrical Stimulation;Iontophoresis 61m/ml Dexamethasone;Ultrasound;Balance training;Therapeutic exercise;Therapeutic activities;Functional mobility training;Stair training;Gait training;Neuromuscular re-education;Patient/family education;Manual techniques;Passive range of motion;Dry needling;Taping;Vasopneumatic Device;Joint Manipulations;Moist Heat    PT Next Visit Plan PROM, AAROM, hold on strengthening until f/u Dr. BNinfa Linden   PT Home Exercise Plan Access Code: [email or web address]  URL: [email or web address]  Date: 01/23/2021  Prepared by: RVista Mink   Exercises  Seated Biceps Curl - 2-3 x daily - 7 x weekly - 2 sets - 10 reps  Seated Scapular Retraction - 2 x daily - 6 x weekly - 1 sets - 20 reps  Supine Scapular Protraction in Flexion with Dumbbells - 2-3 x daily - 7 x weekly - 1 sets - 20 reps - 3 seconds hold  Supine Shoulder Internal Rotation Stretch - 2-3 x daily - 7 x weekly - 1 sets - 10-20 reps - 10 seconds hold  Supine Shoulder External Rotation Stretch - 2-3 x daily - 7 x weekly - 1 sets - 10-20 reps - 10 seconds hold  Seated Shoulder Flexion AAROM with Pulley Behind - 2 x daily - 7 x weekly - 1 sets  - 10 reps - 5-10 seconds hold    Consulted and Agree with Plan of Care Patient             Patient will benefit from skilled therapeutic intervention in order to improve the following deficits and impairments:  Pain, Postural dysfunction, Decreased strength, Increased edema, Other (comment), Decreased activity tolerance, Impaired UE functional use, Decreased range of motion, Impaired flexibility  Visit Diagnosis: Stiffness of right shoulder, not elsewhere classified  Acute pain of right shoulder  Muscle weakness (generalized)  Localized edema     Problem List Patient Active Problem List   Diagnosis Date Noted   History of prostate cancer 11/11/2020   Right shoulder pain 09/02/2020   History of hyperlipidemia, mixed 12/15/2017   Arthritis 12/14/2016   Gastroesophageal reflux disease without esophagitis 12/14/2016    RFarley Ly PT, MPT 01/23/2021, 2:35 PM  [facility] [address] [address]Phone: [phone]  Fax:  [phone] Name: BRANDEEP BIONDOLILLOMRN: [id number] of Birth: 712/18/1952

## 2021-01-23 NOTE — Patient Instructions (Addendum)
Access Code: [email or web address] URL: [email or web address] Date: 01/23/2021 Prepared by: Vista Mink  Exercises Seated Biceps Curl - 2-3 x daily - 7 x weekly - 2 sets - 10 reps Seated Scapular Retraction - 2 x daily - 6 x weekly - 1 sets - 20 reps Supine Scapular Protraction in Flexion with Dumbbells - 2-3 x daily - 7 x weekly - 1 sets - 20 reps - 3 seconds hold Supine Shoulder Internal Rotation Stretch - 2-3 x daily - 7 x weekly - 1 sets - 10-20 reps - 10 seconds hold Supine Shoulder External Rotation Stretch - 2-3 x daily - 7 x weekly - 1 sets - 10-20 reps - 10 seconds hold Seated Shoulder Flexion AAROM with Pulley Behind - 2 x daily - 7 x weekly - 1 sets - 10 reps - 5-10 seconds hold

## 2021-01-28 ENCOUNTER — Other Ambulatory Visit: Payer: Self-pay

## 2021-01-28 ENCOUNTER — Ambulatory Visit (INDEPENDENT_AMBULATORY_CARE_PROVIDER_SITE_OTHER): Payer: Medicare Other | Admitting: Physical Therapy

## 2021-01-28 ENCOUNTER — Encounter: Payer: Self-pay | Admitting: Physical Therapy

## 2021-01-28 ENCOUNTER — Encounter: Payer: Medicare Other | Admitting: Physical Therapy

## 2021-01-28 DIAGNOSIS — M6281 Muscle weakness (generalized): Secondary | ICD-10-CM

## 2021-01-28 DIAGNOSIS — M25611 Stiffness of right shoulder, not elsewhere classified: Secondary | ICD-10-CM

## 2021-01-28 DIAGNOSIS — M25511 Pain in right shoulder: Secondary | ICD-10-CM | POA: Diagnosis not present

## 2021-01-28 DIAGNOSIS — R600 Localized edema: Secondary | ICD-10-CM

## 2021-01-28 NOTE — Therapy (Signed)
[facility] Physical Therapy [address] [place], [place], [address] Phone: [phone]   Fax:  [phone]  Physical Therapy Treatment Progress Note  Patient Details  Name: Daniel Barber MRN: [id number] Date of Birth: 1950/03/09 Referring Provider (PT): Jean Rosenthal MD Progress Note Reporting Period 12/18/2020 to 01/28/2021   See note below for Objective Data and Assessment of Progress/Goals.      Encounter Date: 01/28/2021   PT End of Session - 01/28/21 0857     Visit Number 10    Number of Visits 17    Date for PT Re-Evaluation 02/14/21    Authorization Type KX modifier after visit 15    Progress Note Due on Visit 17    PT Start Time 0850    PT Stop Time 0928    PT Time Calculation (min) 38 min    Activity Tolerance Patient tolerated treatment well;No increased pain    Behavior During Therapy [facility] for tasks assessed/performed             Past Medical History:  Diagnosis Date   Arthritis    COPD, mild (HCC)    External hemorrhoids    symptomatic   GERD (gastroesophageal reflux disease)    History of adenomatous polyp of colon followed by dr Watt Climes   tubular adenoma's 1995;  2000;  2006;  2016   History of malignant melanoma of skin 2017   excision posterior neck (localized)   History of prostate cancer followed by pcp   1999--  radical prostatectomy/  per pt last PSA less than 0.1 on May 2017   Nocturia    Wears glasses     Past Surgical History:  Procedure Laterality Date   COLONOSCOPY  last one 07-27-2014  dr Watt Climes   ELBOW SURGERY Right 1989   HEMORRHOID SURGERY N/A 08/28/2016   Procedure: HEMORRHOIDECTOMY;  Surgeon: Clovis Riley, MD;  Location: [place];  Service: General;  Laterality: N/A;   KNEE ARTHROSCOPY Left 2002  approx.   MELANOMA EXCISION  2017   posterior neck (localized)   PROSTATECTOMY  1999   dr Risa Grill    There were no vitals filed for this visit.   Subjective Assessment -  01/28/21 0855     Subjective Pt arriving to therapy reporting pain level of 2/10.    Pertinent History Prostate Cancer, GERD, Arthritis, COPD, knee arthroscopy left 2002    Diagnostic tests Pre images:   1. Severe tendinosis of the supraspinatus tendon with a large  full-thickness tear measuring 2 cm in anterior-posterior dimension.  2. Moderate tendinosis of the infraspinatus tendon.  3. Moderate tendinosis of the intra-articular portion of the long  head of the biceps tendon.  4. Mild partial-thickness cartilage loss of the posterior glenoid  with subchondral reactive marrow changes.    Patient Stated Goals get back to fishing, working on old cars, gardening and shooting    Currently in Pain? Yes    Pain Score 2     Pain Orientation Right    Pain Descriptors / Indicators Tightness;Sore    Pain Type Surgical pain    Pain Onset More than a month ago                [facility] PT Assessment - 01/28/21 0001       Assessment   Medical Diagnosis Z46.011D traumatic complete tear of rotator cuff    Referring Provider (PT) Jean Rosenthal MD    Onset Date/Surgical Date 12/05/20    Hand  Dominance Right      ROM / Strength   AROM / PROM / Strength PROM      PROM   Overall PROM Comments supine measurement    PROM Assessment Site Shoulder    Right/Left Shoulder Right    Right Shoulder Flexion 140 Degrees    Right Shoulder ABduction 110 Degrees    Right Shoulder External Rotation 55 Degrees   shoulder abd 45 degrees                          [facility] Adult PT Treatment/Exercise - 01/28/21 0001       Exercises   Exercises Shoulder      Shoulder Exercises: Supine   External Rotation AAROM;Right;10 reps;Limitations    External Rotation Limitations 5 second hold using 2# weight    Flexion AAROM;Right;20 reps;Limitations    Flexion Limitations holding 3-5 seconds using 2# bar      Shoulder Exercises: Seated   Retraction Strengthening;Both;10 reps;Limitations     Retraction Limitations 5 seconds    Other Seated Exercises UE ranger: flexion, scaption x 30 each      Shoulder Exercises: Pulleys   Flexion 3 minutes    Scaption 3 minutes      Modalities   Modalities Vasopneumatic      Vasopneumatic   Number Minutes Vasopneumatic  10 minutes    Vasopnuematic Location  Shoulder    Vasopneumatic Pressure Medium    Vasopneumatic Temperature  34      Manual Therapy   Manual therapy comments Rt shoulder PROM to comfort (IR, ER, flexion)                       PT Short Term Goals - 01/28/21 5329       PT SHORT TERM GOAL #1   Title Pt will be independent in his initial HEP.    Status Achieved      PT SHORT TERM GOAL #2   Title Pt will be able to improve his passive right shoulder flexion to 90 degrees with pain </= 4/10.    Status Achieved               PT Long Term Goals - 01/28/21 9242       PT LONG TERM GOAL #1   Title Pt will improve his FOTO to >/= 63%    Baseline 53.4% on 01/28/2021    Status On-going      PT LONG TERM GOAL #2   Title Pt will  improve his right shoulder flexion to >/= 140 degrees.    Baseline 142 passively    Status On-going      PT LONG TERM GOAL #3   Title Pt will improve right passive ER to >/= 65 degrees.    Baseline AAROM measured at 55 degrees    Status On-going      PT LONG TERM GOAL #4   Title Pt will be independent in his advanced HEP following protocol progression.    Status On-going      PT LONG TERM GOAL #5   Title Pt will be able to report improved sleep by 50%.    Baseline Sleep is now uninterrupted    Status Achieved                   Plan - 01/28/21 0858     Clinical Impression Statement Pt reporting pain level of 2/10. Pt stating  his ADL's are going much better at home. Pt stating his sleep is going much better. Pt tolerating exercises well. Pt is continuing to progress with his AAROM. Continue to progress as pt tolerates. Holding on Strengthening until follow  up with Dr. Ninfa Linden on 02/03/2021.    Personal Factors and Comorbidities Comorbidity 3+    Comorbidities prostate CA, COPD, arthritis, GERD, left knee surgery    Examination-Activity Limitations Lift;Dressing;Carry;Transfers;Other;Bathing;Sleep    Stability/Clinical Decision Making Stable/Uncomplicated    Rehab Potential Good    PT Frequency 2x / week    PT Treatment/Interventions ADLs/Self Care Home Management;Cryotherapy;Electrical Stimulation;Iontophoresis 4mg /ml Dexamethasone;Ultrasound;Balance training;Therapeutic exercise;Therapeutic activities;Functional mobility training;Stair training;Gait training;Neuromuscular re-education;Patient/family education;Manual techniques;Passive range of motion;Dry needling;Taping;Vasopneumatic Device;Joint Manipulations;Moist Heat    PT Next Visit Plan PROM, AAROM, hold on strengthening until f/u Dr. Ninfa Linden    PT Home Exercise Plan Access Code: [email or web address]  URL: [email or web address]  Date: 01/23/2021  Prepared by: Vista Mink    Exercises  Seated Biceps Curl - 2-3 x daily - 7 x weekly - 2 sets - 10 reps  Seated Scapular Retraction - 2 x daily - 6 x weekly - 1 sets - 20 reps  Supine Scapular Protraction in Flexion with Dumbbells - 2-3 x daily - 7 x weekly - 1 sets - 20 reps - 3 seconds hold  Supine Shoulder Internal Rotation Stretch - 2-3 x daily - 7 x weekly - 1 sets - 10-20 reps - 10 seconds hold  Supine Shoulder External Rotation Stretch - 2-3 x daily - 7 x weekly - 1 sets - 10-20 reps - 10 seconds hold  Seated Shoulder Flexion AAROM with Pulley Behind - 2 x daily - 7 x weekly - 1 sets - 10 reps - 5-10 seconds hold    Consulted and Agree with Plan of Care Patient             Patient will benefit from skilled therapeutic intervention in order to improve the following deficits and impairments:  Pain, Postural dysfunction, Decreased strength, Increased edema, Other (comment), Decreased activity tolerance, Impaired UE functional use, Decreased  range of motion, Impaired flexibility  Visit Diagnosis: Stiffness of right shoulder, not elsewhere classified  Acute pain of right shoulder  Muscle weakness (generalized)  Localized edema     Problem List Patient Active Problem List   Diagnosis Date Noted   History of prostate cancer 11/11/2020   Right shoulder pain 09/02/2020   History of hyperlipidemia, mixed 12/15/2017   Arthritis 12/14/2016   Gastroesophageal reflux disease without esophagitis 12/14/2016    Oretha Caprice, PT, MPT 01/28/2021, 9:27 AM  [facility] Physical Therapy [address] [place], [place], [address] Phone: [phone]   Fax:  [phone]  Name: Daniel Barber MRN: [id number] Date of Birth: 1950/08/14

## 2021-01-30 ENCOUNTER — Encounter: Payer: Self-pay | Admitting: Rehabilitative and Restorative Service Providers"

## 2021-01-30 ENCOUNTER — Ambulatory Visit (INDEPENDENT_AMBULATORY_CARE_PROVIDER_SITE_OTHER): Payer: Medicare Other | Admitting: Rehabilitative and Restorative Service Providers"

## 2021-01-30 ENCOUNTER — Encounter: Payer: Medicare Other | Admitting: Physical Therapy

## 2021-01-30 ENCOUNTER — Other Ambulatory Visit: Payer: Self-pay

## 2021-01-30 DIAGNOSIS — M25511 Pain in right shoulder: Secondary | ICD-10-CM | POA: Diagnosis not present

## 2021-01-30 DIAGNOSIS — M6281 Muscle weakness (generalized): Secondary | ICD-10-CM | POA: Diagnosis not present

## 2021-01-30 DIAGNOSIS — M25611 Stiffness of right shoulder, not elsewhere classified: Secondary | ICD-10-CM | POA: Diagnosis not present

## 2021-01-30 DIAGNOSIS — R600 Localized edema: Secondary | ICD-10-CM | POA: Diagnosis not present

## 2021-01-30 NOTE — Patient Instructions (Signed)
[email or web address]  Access Code: [email or web address] URL: [email or web address] Date: 01/30/2021 Prepared by: Vista Mink  Exercises Seated Biceps Curl - 2-3 x daily - 7 x weekly - 2 sets - 10 reps Seated Scapular Retraction - 2 x daily - 6 x weekly - 1 sets - 20 reps Supine Scapular Protraction in Flexion with Dumbbells - 2-3 x daily - 7 x weekly - 1 sets - 20 reps - 3 seconds hold Supine Shoulder Internal Rotation Stretch - 2-3 x daily - 7 x weekly - 1 sets - 10-20 reps - 10 seconds hold Supine Shoulder External Rotation Stretch - 2-3 x daily - 7 x weekly - 1 sets - 10-20 reps - 10 seconds hold Seated Shoulder Flexion AAROM with Pulley Behind - 2 x daily - 7 x weekly - 1 sets - 10 reps - 5-10 seconds hold

## 2021-01-30 NOTE — Therapy (Signed)
[facility] Physical Therapy [address] [place], [place], [address] Phone: [phone]   Fax:  [phone]  Physical Therapy Treatment  Patient Details  Name: Daniel Barber MRN: [id number] Date of Birth: 1950/08/09 Referring Provider (PT): Jean Rosenthal MD   Encounter Date: 01/30/2021   PT End of Session - 01/30/21 1300     Visit Number 11    Number of Visits 17    Date for PT Re-Evaluation 02/14/21    Authorization Type KX modifier after visit 15    Progress Note Due on Visit 17    PT Start Time 0845    PT Stop Time 0930    PT Time Calculation (min) 45 min    Activity Tolerance Patient tolerated treatment well;No increased pain    Behavior During Therapy [facility] for tasks assessed/performed             Past Medical History:  Diagnosis Date   Arthritis    COPD, mild (HCC)    External hemorrhoids    symptomatic   GERD (gastroesophageal reflux disease)    History of adenomatous polyp of colon followed by dr Watt Climes   tubular adenoma's 1995;  2000;  2006;  2016   History of malignant melanoma of skin 2017   excision posterior neck (localized)   History of prostate cancer followed by pcp   1999--  radical prostatectomy/  per pt last PSA less than 0.1 on May 2017   Nocturia    Wears glasses     Past Surgical History:  Procedure Laterality Date   COLONOSCOPY  last one 07-27-2014  dr Watt Climes   ELBOW SURGERY Right 1989   HEMORRHOID SURGERY N/A 08/28/2016   Procedure: HEMORRHOIDECTOMY;  Surgeon: Clovis Riley, MD;  Location: [place];  Service: General;  Laterality: N/A;   KNEE ARTHROSCOPY Left 2002  approx.   MELANOMA EXCISION  2017   posterior neck (localized)   PROSTATECTOMY  1999   dr Risa Grill    There were no vitals filed for this visit.   Subjective Assessment - 01/30/21 0922     Subjective Daniel Barber is using ibuprofen 2X/day.  Sleep is uninterrupted.    Pertinent History Prostate Cancer, GERD, Arthritis, COPD, knee  arthroscopy left 2002    Diagnostic tests Pre images:   1. Severe tendinosis of the supraspinatus tendon with a large  full-thickness tear measuring 2 cm in anterior-posterior dimension.  2. Moderate tendinosis of the infraspinatus tendon.  3. Moderate tendinosis of the intra-articular portion of the long  head of the biceps tendon.  4. Mild partial-thickness cartilage loss of the posterior glenoid  with subchondral reactive marrow changes.    Patient Stated Goals get back to fishing, working on old cars, gardening and shooting    Currently in Pain? Yes    Pain Score 2     Pain Location Shoulder    Pain Descriptors / Indicators Aching;Burning    Pain Type Surgical pain    Pain Radiating Towards Anterior and lateral arm    Pain Onset More than a month ago    Pain Frequency Constant    Aggravating Factors  Reaching and overhead function    Pain Relieving Factors Ibuprofen    Effect of Pain on Daily Activities Unable to fish, garden or shoot    Multiple Pain Sites No  [facility] Adult PT Treatment/Exercise - 01/30/21 0001       Exercises   Exercises Shoulder      Shoulder Exercises: Supine   Protraction AAROM;Right;20 reps;Limitations    Protraction Limitations 3 seconds, Reach up towards ceiling palm in with L side helping    External Rotation AAROM;Right;10 reps;Limitations    External Rotation Limitations 10 seconds at 70 degrees abduction and 3 pillows under elbow in supine    Internal Rotation AAROM;Right;10 reps;Limitations    Internal Rotation Limitations 10 seconds at 70 degrees abduction elbow on 3 pillows    Flexion AAROM;Right;20 reps;Limitations    Flexion Limitations L side helps, protract 1st (in comfortable range)      Shoulder Exercises: Seated   Retraction Strengthening;Both;10 reps;Limitations    Retraction Limitations 5 seconds      Shoulder Exercises: Standing   Other Standing Exercises Elbow 20X 3 seconds       Shoulder Exercises: Pulleys   Flexion Limitations    Flexion Limitations Palm in, protract 1st in painfree range 10X 5 seconds    Scaption Limitations    Scaption Limitations 10X 5 seconds Palm in, reach out 1st      Modalities   Modalities Vasopneumatic      Vasopneumatic   Number Minutes Vasopneumatic  10 minutes    Vasopnuematic Location  Shoulder    Vasopneumatic Pressure Medium    Vasopneumatic Temperature  34      Manual Therapy   Manual therapy comments Rt shoulder PROM to comfort (IR, ER, flexion)                     PT Education - 01/30/21 1259     Education Details Daniel Barber continues to make progress with his R shoulder function.  Reviewed HEP with corrections as needed.    Person(s) Educated Patient    Methods Explanation;Tactile cues;Verbal cues    Comprehension Verbalized understanding;Returned demonstration;Verbal cues required;Tactile cues required;Need further instruction              PT Short Term Goals - 01/28/21 0903       PT SHORT TERM GOAL #1   Title Pt will be independent in his initial HEP.    Status Achieved      PT SHORT TERM GOAL #2   Title Pt will be able to improve his passive right shoulder flexion to 90 degrees with pain </= 4/10.    Status Achieved               PT Long Term Goals - 01/28/21 5681       PT LONG TERM GOAL #1   Title Pt will improve his FOTO to >/= 63%    Baseline 53.4% on 01/28/2021    Status On-going      PT LONG TERM GOAL #2   Title Pt will  improve his right shoulder flexion to >/= 140 degrees.    Baseline 142 passively    Status On-going      PT LONG TERM GOAL #3   Title Pt will improve right passive ER to >/= 65 degrees.    Baseline AAROM measured at 55 degrees    Status On-going      PT LONG TERM GOAL #4   Title Pt will be independent in his advanced HEP following protocol progression.    Status On-going      PT LONG TERM GOAL #5   Title Pt will be able to report improved sleep by 50%.  Baseline Sleep is now uninterrupted    Status Achieved                   Plan - 01/30/21 1301     Clinical Impression Statement Daniel Barber continues to work on, and make progress with his AROM.  This remains the focus until at least his follow-up with Dr. Ninfa Linden next week.    Personal Factors and Comorbidities Comorbidity 3+    Comorbidities prostate CA, COPD, arthritis, GERD, left knee surgery    Examination-Activity Limitations Lift;Dressing;Carry;Transfers;Other;Bathing;Sleep    Stability/Clinical Decision Making Stable/Uncomplicated    Rehab Potential Good    PT Frequency 2x / week    PT Duration 4 weeks    PT Treatment/Interventions ADLs/Self Care Home Management;Cryotherapy;Electrical Stimulation;Iontophoresis 4mg /ml Dexamethasone;Ultrasound;Balance training;Therapeutic exercise;Therapeutic activities;Functional mobility training;Stair training;Gait training;Neuromuscular re-education;Patient/family education;Manual techniques;Passive range of motion;Dry needling;Taping;Vasopneumatic Device;Joint Manipulations;Moist Heat    PT Next Visit Plan PROM, AAROM, AROM, hold on strengthening until f/u Dr. Ninfa Linden    PT Home Exercise Plan Access Code: [email or web address]  URL: [email or web address]  Date: 01/23/2021  Prepared by: Vista Mink    Exercises  Seated Biceps Curl - 2-3 x daily - 7 x weekly - 2 sets - 10 reps  Seated Scapular Retraction - 2 x daily - 6 x weekly - 1 sets - 20 reps  Supine Scapular Protraction in Flexion with Dumbbells - 2-3 x daily - 7 x weekly - 1 sets - 20 reps - 3 seconds hold  Supine Shoulder Internal Rotation Stretch - 2-3 x daily - 7 x weekly - 1 sets - 10-20 reps - 10 seconds hold  Supine Shoulder External Rotation Stretch - 2-3 x daily - 7 x weekly - 1 sets - 10-20 reps - 10 seconds hold  Seated Shoulder Flexion AAROM with Pulley Behind - 2 x daily - 7 x weekly - 1 sets - 10 reps - 5-10 seconds hold    Consulted and Agree with Plan of Care Patient              Patient will benefit from skilled therapeutic intervention in order to improve the following deficits and impairments:  Pain, Postural dysfunction, Decreased strength, Increased edema, Other (comment), Decreased activity tolerance, Impaired UE functional use, Decreased range of motion, Impaired flexibility  Visit Diagnosis: Stiffness of right shoulder, not elsewhere classified  Acute pain of right shoulder  Muscle weakness (generalized)  Localized edema     Problem List Patient Active Problem List   Diagnosis Date Noted   History of prostate cancer 11/11/2020   Right shoulder pain 09/02/2020   History of hyperlipidemia, mixed 12/15/2017   Arthritis 12/14/2016   Gastroesophageal reflux disease without esophagitis 12/14/2016    Farley Ly, PT, MPT 01/30/2021, 1:02 PM  [facility] Physical Therapy [address] [place], [place], [address] Phone: [phone]   Fax:  [phone]  Name: Daniel Barber MRN: [id number] Date of Birth: 05/08/1950

## 2021-02-03 ENCOUNTER — Encounter: Payer: Self-pay | Admitting: Orthopaedic Surgery

## 2021-02-03 ENCOUNTER — Ambulatory Visit (INDEPENDENT_AMBULATORY_CARE_PROVIDER_SITE_OTHER): Payer: Medicare Other | Admitting: Orthopaedic Surgery

## 2021-02-03 DIAGNOSIS — S46011D Strain of muscle(s) and tendon(s) of the rotator cuff of right shoulder, subsequent encounter: Secondary | ICD-10-CM

## 2021-02-03 DIAGNOSIS — Z9889 Other specified postprocedural states: Secondary | ICD-10-CM

## 2021-02-03 NOTE — Progress Notes (Signed)
The patient is now 9 weeks status post a right shoulder arthroscopic rotator cuff repair.  He is 71 years old and active.  He has been going through physical therapy and reports that he is now doing better in terms of increased motion and strength of the shoulder.  This was very tough repair given his age and nature of his fibers in the shoulder.  On exam he is moving his right shoulder much better than what he was preoperative and he does look like he is improving.  His abduction and rotation are improving.  He will continue physical therapy for another 4 weeks.  They can start subtle strengthening exercises as well from my standpoint.  I will reevaluate him in 4 weeks.  All questions and concerns were answered and addressed.  He is pleased overall.

## 2021-02-04 ENCOUNTER — Encounter: Payer: Self-pay | Admitting: Physical Therapy

## 2021-02-04 ENCOUNTER — Ambulatory Visit (INDEPENDENT_AMBULATORY_CARE_PROVIDER_SITE_OTHER): Payer: Medicare Other | Admitting: Physical Therapy

## 2021-02-04 ENCOUNTER — Other Ambulatory Visit: Payer: Self-pay

## 2021-02-04 ENCOUNTER — Encounter: Payer: Medicare Other | Admitting: Physical Therapy

## 2021-02-04 DIAGNOSIS — M25511 Pain in right shoulder: Secondary | ICD-10-CM

## 2021-02-04 DIAGNOSIS — M6281 Muscle weakness (generalized): Secondary | ICD-10-CM | POA: Diagnosis not present

## 2021-02-04 DIAGNOSIS — R600 Localized edema: Secondary | ICD-10-CM | POA: Diagnosis not present

## 2021-02-04 DIAGNOSIS — M25611 Stiffness of right shoulder, not elsewhere classified: Secondary | ICD-10-CM

## 2021-02-04 NOTE — Therapy (Signed)
[facility] Physical Therapy [address] [place], [place], [address] Phone: [phone]   Fax:  [phone]  Physical Therapy Treatment  Patient Details  Name: Daniel Barber MRN: [id number] Date of Birth: [birth year] Referring Provider (PT): Jean Rosenthal MD   Encounter Date: 02/04/2021   PT End of Session - 02/04/21 0934     Visit Number 12    Number of Visits 17    Date for PT Re-Evaluation 02/14/21    Authorization Type KX modifier after visit 15    Progress Note Due on Visit 17    PT Start Time 0929    PT Stop Time 1015    PT Time Calculation (min) 46 min    Activity Tolerance Patient tolerated treatment well;No increased pain    Behavior During Therapy [facility] for tasks assessed/performed             Past Medical History:  Diagnosis Date   Arthritis    COPD, mild (HCC)    External hemorrhoids    symptomatic   GERD (gastroesophageal reflux disease)    History of adenomatous polyp of colon followed by dr Watt Climes   tubular adenoma's 1995;  2000;  2006;  2016   History of malignant melanoma of skin 2017   excision posterior neck (localized)   History of prostate cancer followed by pcp   1999--  radical prostatectomy/  per pt last PSA less than 0.1 on May 2017   Nocturia    Wears glasses     Past Surgical History:  Procedure Laterality Date   COLONOSCOPY  last one 07-27-2014  dr Watt Climes   ELBOW SURGERY Right 1989   HEMORRHOID SURGERY N/A 08/28/2016   Procedure: HEMORRHOIDECTOMY;  Surgeon: Clovis Riley, MD;  Location: [place];  Service: General;  Laterality: N/A;   KNEE ARTHROSCOPY Left 2002  approx.   MELANOMA EXCISION  2017   posterior neck (localized)   PROSTATECTOMY  1999   dr Risa Grill    There were no vitals filed for this visit.   Subjective Assessment - 02/04/21 0933     Subjective Pt arriving today reporting 2/10 pain in right shoulder. Pt with good report from Dr. Ninfa Linden yesterday. Pt stating he  could begin strengthening.    Pertinent History Prostate Cancer, GERD, Arthritis, COPD, knee arthroscopy left 2002    Limitations House hold activities;Other (comment)    Diagnostic tests Pre images:   1. Severe tendinosis of the supraspinatus tendon with a large  full-thickness tear measuring 2 cm in anterior-posterior dimension.  2. Moderate tendinosis of the infraspinatus tendon.  3. Moderate tendinosis of the intra-articular portion of the long  head of the biceps tendon.  4. Mild partial-thickness cartilage loss of the posterior glenoid  with subchondral reactive marrow changes.    Patient Stated Goals get back to fishing, working on old cars, gardening and shooting    Currently in Pain? Yes    Pain Score 3     Pain Location Shoulder    Pain Orientation Right    Pain Descriptors / Indicators Aching;Sore    Pain Type Surgical pain                [facility] PT Assessment - 02/04/21 0001       Assessment   Medical Diagnosis Z46.011D traumatic complete tear of rotator cuff    Referring Provider (PT) Jean Rosenthal MD    Onset Date/Surgical Date 12/05/20      ROM / Strength  AROM / PROM / Strength PROM      PROM   Overall PROM Comments supine measurement    PROM Assessment Site Shoulder    Right/Left Shoulder Right    Right Shoulder Flexion 142 Degrees    Right Shoulder ABduction 114 Degrees    Right Shoulder External Rotation 65 Degrees   shoulder abd 45 degrees                          [facility] Adult PT Treatment/Exercise - 02/04/21 0001       Exercises   Exercises Shoulder      Shoulder Exercises: Standing   External Rotation Strengthening;Right;20 reps;Theraband    Theraband Level (Shoulder External Rotation) Level 2 (Red)    Internal Rotation Strengthening;Right;20 reps;Theraband    Theraband Level (Shoulder Internal Rotation) Level 2 (Red)    Flexion Strengthening;Both    Flexion Limitations 2# bar    Extension Strengthening;Both;20  reps;Theraband    Theraband Level (Shoulder Extension) Level 2 (Red)    Row Strengthening;Both;20 reps;Theraband    Theraband Level (Shoulder Row) Level 2 (Red)    Other Standing Exercises wall ladder x 15, UE ranger x 10 flexion and scaption    Other Standing Exercises standing with yellow ball on wall shoulder at 90 degrees and small circles with hand on ball x 15 each direction      Shoulder Exercises: Pulleys   Scaption 2 minutes      Shoulder Exercises: ROM/Strengthening   UBE (Upper Arm Bike) L 1.5 6 minutes (64minutes each direction)      Modalities   Modalities Vasopneumatic      Vasopneumatic   Number Minutes Vasopneumatic  10 minutes    Vasopnuematic Location  Shoulder    Vasopneumatic Pressure Medium    Vasopneumatic Temperature  34      Manual Therapy   Manual therapy comments prom: right shoulder flexion, ER, Abd, IR                       PT Short Term Goals - 02/04/21 2376       PT SHORT TERM GOAL #1   Title Pt will be independent in his initial HEP.    Status Achieved      PT SHORT TERM GOAL #2   Title Pt will be able to improve his passive right shoulder flexion to 90 degrees with pain </= 4/10.    Status Achieved               PT Long Term Goals - 02/04/21 2831       PT LONG TERM GOAL #1   Title Pt will improve his FOTO to >/= 63%    Status On-going      PT LONG TERM GOAL #2   Title Pt will  improve his right shoulder flexion to >/= 140 degrees.    Status On-going      PT LONG TERM GOAL #3   Title Pt will improve right passive ER to >/= 65 degrees.    Status On-going      PT LONG TERM GOAL #4   Title Pt will be independent in his advanced HEP following protocol progression.    Status On-going      PT LONG TERM GOAL #5   Title Pt will be able to report improved sleep by 50%.    Status Achieved  Plan - 02/04/21 0935     Clinical Impression Statement Pt wtih good report from Dr. Ninfa Linden  yesterday. Per MD note, pt is cleared for gentle strengthening of right shoulder. Pt tolerating exercises well today beginning strengthening. Continue skilled PT to maximize function.    Personal Factors and Comorbidities Comorbidity 3+    Comorbidities prostate CA, COPD, arthritis, GERD, left knee surgery    Examination-Activity Limitations Lift;Dressing;Carry;Transfers;Other;Bathing;Sleep    Stability/Clinical Decision Making Stable/Uncomplicated    Rehab Potential Good    PT Duration 4 weeks    PT Treatment/Interventions ADLs/Self Care Home Management;Cryotherapy;Electrical Stimulation;Iontophoresis 4mg /ml Dexamethasone;Ultrasound;Balance training;Therapeutic exercise;Therapeutic activities;Functional mobility training;Stair training;Gait training;Neuromuscular re-education;Patient/family education;Manual techniques;Passive range of motion;Dry needling;Taping;Vasopneumatic Device;Joint Manipulations;Moist Heat    PT Next Visit Plan PROM, AAROM, AROM, begin strengtheing per Dr. Ninfa Linden 02/03/2021 note    PT Home Exercise Plan Access Code: [email or web address]  URL: [email or web address]  Date: 01/23/2021  Prepared by: Vista Mink    Exercises  Seated Biceps Curl - 2-3 x daily - 7 x weekly - 2 sets - 10 reps  Seated Scapular Retraction - 2 x daily - 6 x weekly - 1 sets - 20 reps  Supine Scapular Protraction in Flexion with Dumbbells - 2-3 x daily - 7 x weekly - 1 sets - 20 reps - 3 seconds hold  Supine Shoulder Internal Rotation Stretch - 2-3 x daily - 7 x weekly - 1 sets - 10-20 reps - 10 seconds hold  Supine Shoulder External Rotation Stretch - 2-3 x daily - 7 x weekly - 1 sets - 10-20 reps - 10 seconds hold  Seated Shoulder Flexion AAROM with Pulley Behind - 2 x daily - 7 x weekly - 1 sets - 10 reps - 5-10 seconds hold    Consulted and Agree with Plan of Care Patient             Patient will benefit from skilled therapeutic intervention in order to improve the following deficits and  impairments:  Pain, Postural dysfunction, Decreased strength, Increased edema, Other (comment), Decreased activity tolerance, Impaired UE functional use, Decreased range of motion, Impaired flexibility  Visit Diagnosis: Stiffness of right shoulder, not elsewhere classified  Acute pain of right shoulder  Muscle weakness (generalized)  Localized edema     Problem List Patient Active Problem List   Diagnosis Date Noted   History of prostate cancer 11/11/2020   Right shoulder pain 09/02/2020   History of hyperlipidemia, mixed 12/15/2017   Arthritis 12/14/2016   Gastroesophageal reflux disease without esophagitis 12/14/2016    Oretha Caprice, PT, MPT 02/04/2021, 10:06 AM  [facility] Physical Therapy [address] [place], [place], [address] Phone: [phone]   Fax:  [phone]  Name: Daniel Barber MRN: [id number] Date of Birth: [birth year]

## 2021-02-06 ENCOUNTER — Encounter: Payer: Medicare Other | Admitting: Physical Therapy

## 2021-02-06 ENCOUNTER — Encounter: Payer: Self-pay | Admitting: Rehabilitative and Restorative Service Providers"

## 2021-02-06 ENCOUNTER — Other Ambulatory Visit: Payer: Self-pay

## 2021-02-06 ENCOUNTER — Ambulatory Visit (INDEPENDENT_AMBULATORY_CARE_PROVIDER_SITE_OTHER): Payer: Medicare Other | Admitting: Rehabilitative and Restorative Service Providers"

## 2021-02-06 DIAGNOSIS — R600 Localized edema: Secondary | ICD-10-CM

## 2021-02-06 DIAGNOSIS — M25511 Pain in right shoulder: Secondary | ICD-10-CM

## 2021-02-06 DIAGNOSIS — M6281 Muscle weakness (generalized): Secondary | ICD-10-CM

## 2021-02-06 DIAGNOSIS — M25611 Stiffness of right shoulder, not elsewhere classified: Secondary | ICD-10-CM

## 2021-02-06 NOTE — Therapy (Signed)
[facility] Physical Therapy [address] [place], [place], [address] Phone: [phone]   Fax:  [phone]  Physical Therapy Treatment  Patient Details  Name: Daniel Barber MRN: [id number] Date of Birth: 1950/06/16 Referring Provider (PT): Jean Rosenthal MD   Encounter Date: 02/06/2021   PT End of Session - 02/06/21 0914     Visit Number 13    Number of Visits 17    Date for PT Re-Evaluation 02/14/21    Authorization Type KX modifier after visit 15    Progress Note Due on Visit 17    PT Start Time 0842    PT Stop Time 0942    PT Time Calculation (min) 60 min    Activity Tolerance Patient tolerated treatment well;No increased pain    Behavior During Therapy [facility] for tasks assessed/performed             Past Medical History:  Diagnosis Date   Arthritis    COPD, mild (HCC)    External hemorrhoids    symptomatic   GERD (gastroesophageal reflux disease)    History of adenomatous polyp of colon followed by dr Watt Climes   tubular adenoma's 1995;  2000;  2006;  2016   History of malignant melanoma of skin 2017   excision posterior neck (localized)   History of prostate cancer followed by pcp   1999--  radical prostatectomy/  per pt last PSA less than 0.1 on May 2017   Nocturia    Wears glasses     Past Surgical History:  Procedure Laterality Date   COLONOSCOPY  last one 07-27-2014  dr Watt Climes   ELBOW SURGERY Right 1989   HEMORRHOID SURGERY N/A 08/28/2016   Procedure: HEMORRHOIDECTOMY;  Surgeon: Clovis Riley, MD;  Location: [place];  Service: General;  Laterality: N/A;   KNEE ARTHROSCOPY Left 2002  approx.   MELANOMA EXCISION  2017   posterior neck (localized)   PROSTATECTOMY  1999   dr Risa Grill    There were no vitals filed for this visit.   Subjective Assessment - 02/06/21 0900     Subjective Daniel Barber reports his 95 pound dog "took off" on him and pulled him to the ground, landing on his R shoulder.  He is stiff and  sore today.    Pertinent History Prostate Cancer, GERD, Arthritis, COPD, knee arthroscopy left 2002    Limitations House hold activities;Other (comment)    Diagnostic tests Pre images:   1. Severe tendinosis of the supraspinatus tendon with a large  full-thickness tear measuring 2 cm in anterior-posterior dimension.  2. Moderate tendinosis of the infraspinatus tendon.  3. Moderate tendinosis of the intra-articular portion of the long  head of the biceps tendon.  4. Mild partial-thickness cartilage loss of the posterior glenoid  with subchondral reactive marrow changes.    Patient Stated Goals get back to fishing, working on old cars, gardening and shooting    Pain Score 3     Pain Location Shoulder    Pain Orientation Right    Pain Descriptors / Indicators Aching;Sore;Tightness    Pain Type Surgical pain    Pain Radiating Towards Overall shoulder and upper arm    Pain Onset More than a month ago    Pain Frequency Constant    Aggravating Factors  All R UE use, particularly reaching and overhead function    Pain Relieving Factors Ibuprofen and ice    Effect of Pain on Daily Activities Unable to fish, garden or shoot  Multiple Pain Sites No                [facility] PT Assessment - 02/06/21 0001       ROM / Strength   AROM / PROM / Strength AROM      AROM   Overall AROM Comments Measured supine (IR/ER at 70 degrees abduction)    Right/Left Shoulder Right    Right Shoulder Internal Rotation 45 Degrees    Right Shoulder External Rotation 65 Degrees    Right Shoulder Horizontal  ADduction 15 Degrees                           [facility] Adult PT Treatment/Exercise - 02/06/21 0001       Exercises   Exercises Shoulder      Shoulder Exercises: Supine   Protraction AAROM;Right;20 reps;Limitations    Protraction Weight (lbs) 2    Protraction Limitations 3 seconds, Reach up towards ceiling palm in with L side helping    External Rotation AAROM;Right;10 reps;Limitations     External Rotation Limitations 10 seconds at 70 degrees abduction and 3 pillows under elbow in supine    Internal Rotation AAROM;Right;10 reps;Limitations    Internal Rotation Limitations 10 seconds at 70 degrees abduction elbow on 3 pillows    Flexion AAROM;Right;20 reps;Limitations    Flexion Limitations L side helps, protract 1st (in comfortable range)      Shoulder Exercises: Seated   Retraction Strengthening;Both;10 reps;Limitations    Retraction Limitations 5 seconds      Shoulder Exercises: Standing   External Rotation Strengthening;Right;10 reps;Theraband;Limitations    Theraband Level (Shoulder External Rotation) Level 2 (Red)    External Rotation Limitations slow eccentrics    Internal Rotation Strengthening;Right;10 reps;Theraband;Limitations    Theraband Level (Shoulder Internal Rotation) Level 2 (Red)    Internal Rotation Limitations slow eccentrics    Other Standing Exercises Biceps curls with supination 20X 3 seconds 2# slow eccentrics      Shoulder Exercises: Pulleys   Flexion Limitations    Flexion Limitations Palm in, protract 1st in painfree range 10X 5 seconds    Scaption Limitations    Scaption Limitations 10X 5 seconds Palm in, reach out 1st      Modalities   Modalities Vasopneumatic      Vasopneumatic   Number Minutes Vasopneumatic  10 minutes    Vasopnuematic Location  Shoulder    Vasopneumatic Pressure Medium    Vasopneumatic Temperature  34                     PT Education - 02/06/21 0913     Education Details Reviewed HEP with addition of gentle posterior capsule stretch and weights with biceps curls and supine arm raises (serratus anterior).    Person(s) Educated Patient    Methods Explanation;Demonstration;Tactile cues;Verbal cues;Handout    Comprehension Verbalized understanding;Tactile cues required;Returned demonstration;Need further instruction;Verbal cues required              PT Short Term Goals - 02/04/21 2094        PT SHORT TERM GOAL #1   Title Pt will be independent in his initial HEP.    Status Achieved      PT SHORT TERM GOAL #2   Title Pt will be able to improve his passive right shoulder flexion to 90 degrees with pain </= 4/10.    Status Achieved  PT Long Term Goals - 02/04/21 9381       PT LONG TERM GOAL #1   Title Pt will improve his FOTO to >/= 63%    Status On-going      PT LONG TERM GOAL #2   Title Pt will  improve his right shoulder flexion to >/= 140 degrees.    Status On-going      PT LONG TERM GOAL #3   Title Pt will improve right passive ER to >/= 65 degrees.    Status On-going      PT LONG TERM GOAL #4   Title Pt will be independent in his advanced HEP following protocol progression.    Status On-going      PT LONG TERM GOAL #5   Title Pt will be able to report improved sleep by 50%.    Status Achieved                   Plan - 02/06/21 0932     Clinical Impression Statement Daniel Barber is feeling "beat up" from his fall yesterday when his dog jerked so hard that he fell on his R side.  He was able to complete all activities with "some" soreness but no pain.  AROM was a bit guarded and will need to be reassessed next week to see if this is just soreness or if any damage was possibly done to Daniel Barber's repair.    Personal Factors and Comorbidities Comorbidity 3+    Comorbidities prostate CA, COPD, arthritis, GERD, left knee surgery    Examination-Activity Limitations Lift;Dressing;Carry;Transfers;Other;Bathing;Sleep    Stability/Clinical Decision Making Stable/Uncomplicated    Rehab Potential Good    PT Frequency 2x / week    PT Duration 4 weeks    PT Treatment/Interventions ADLs/Self Care Home Management;Cryotherapy;Electrical Stimulation;Iontophoresis 4mg /ml Dexamethasone;Ultrasound;Balance training;Therapeutic exercise;Therapeutic activities;Functional mobility training;Stair training;Gait training;Neuromuscular re-education;Patient/family  education;Manual techniques;Passive range of motion;Dry needling;Taping;Vasopneumatic Device;Joint Manipulations;Moist Heat    PT Next Visit Plan Check AROM (post dog related fall).  Continue AROM work with strength progressions as appropriate.    PT Home Exercise Plan Access Code: [email or web address]  URL: [email or web address]  Date: 01/23/2021  Prepared by: Vista Mink    Exercises  Seated Biceps Curl - 2-3 x daily - 7 x weekly - 2 sets - 10 reps  Seated Scapular Retraction - 2 x daily - 6 x weekly - 1 sets - 20 reps  Supine Scapular Protraction in Flexion with Dumbbells - 2-3 x daily - 7 x weekly - 1 sets - 20 reps - 3 seconds hold  Supine Shoulder Internal Rotation Stretch - 2-3 x daily - 7 x weekly - 1 sets - 10-20 reps - 10 seconds hold  Supine Shoulder External Rotation Stretch - 2-3 x daily - 7 x weekly - 1 sets - 10-20 reps - 10 seconds hold  Seated Shoulder Flexion AAROM with Pulley Behind - 2 x daily - 7 x weekly - 1 sets - 10 reps - 5-10 seconds hold    Consulted and Agree with Plan of Care Patient             Patient will benefit from skilled therapeutic intervention in order to improve the following deficits and impairments:  Pain, Postural dysfunction, Decreased strength, Increased edema, Other (comment), Decreased activity tolerance, Impaired UE functional use, Decreased range of motion, Impaired flexibility  Visit Diagnosis: Stiffness of right shoulder, not elsewhere classified  Acute pain of right shoulder  Muscle weakness (generalized)  Localized edema  Problem List Patient Active Problem List   Diagnosis Date Noted   History of prostate cancer 11/11/2020   Right shoulder pain 09/02/2020   History of hyperlipidemia, mixed 12/15/2017   Arthritis 12/14/2016   Gastroesophageal reflux disease without esophagitis 12/14/2016    Farley Ly, PT, MPT 02/06/2021, 10:37 AM  [facility] Physical Therapy [address] [place], [place],  [address] Phone: [phone]   Fax:  [phone]  Name: Daniel Barber MRN: [id number] Date of Birth: 1950/04/24

## 2021-02-06 NOTE — Patient Instructions (Signed)
Access Code: [email or web address] URL: [email or web address] Date: 02/06/2021 Prepared by: Vista Mink  Exercises Seated Biceps Curl - 2-3 x daily - 7 x weekly - 2 sets - 10 reps Seated Scapular Retraction - 2 x daily - 6 x weekly - 1 sets - 20 reps Supine Scapular Protraction in Flexion with Dumbbells - 2-3 x daily - 7 x weekly - 1 sets - 20 reps - 3 seconds hold Supine Shoulder Internal Rotation Stretch - 2-3 x daily - 7 x weekly - 1 sets - 10-20 reps - 10 seconds hold Supine Shoulder External Rotation Stretch - 2-3 x daily - 7 x weekly - 1 sets - 10-20 reps - 10 seconds hold Seated Shoulder Flexion AAROM with Pulley Behind - 2 x daily - 7 x weekly - 1 sets - 10 reps - 5-10 seconds hold

## 2021-02-11 ENCOUNTER — Other Ambulatory Visit: Payer: Self-pay

## 2021-02-11 ENCOUNTER — Encounter: Payer: Self-pay | Admitting: Physical Therapy

## 2021-02-11 ENCOUNTER — Encounter: Payer: Medicare Other | Admitting: Physical Therapy

## 2021-02-11 ENCOUNTER — Ambulatory Visit (INDEPENDENT_AMBULATORY_CARE_PROVIDER_SITE_OTHER): Payer: Medicare Other | Admitting: Physical Therapy

## 2021-02-11 DIAGNOSIS — M6281 Muscle weakness (generalized): Secondary | ICD-10-CM

## 2021-02-11 DIAGNOSIS — M25611 Stiffness of right shoulder, not elsewhere classified: Secondary | ICD-10-CM

## 2021-02-11 DIAGNOSIS — R600 Localized edema: Secondary | ICD-10-CM

## 2021-02-11 DIAGNOSIS — M25511 Pain in right shoulder: Secondary | ICD-10-CM | POA: Diagnosis not present

## 2021-02-11 NOTE — Therapy (Signed)
[facility] [address] [place], [place], [address] Phone: [phone]   Fax:  [phone]  Physical Therapy Treatment  Patient Details  Name: Daniel Barber MRN: [id number] Date of Birth: [birth year] Referring Provider (PT): Jean Rosenthal MD   Encounter Date: 02/11/2021   PT End of Session - 02/11/21 0940     Visit Number 14    Number of Visits 17    Date for PT Re-Evaluation 02/14/21    Authorization Type KX modifier after visit 15    Progress Note Due on Visit 17    PT Start Time 0845    PT Stop Time 0943    PT Time Calculation (min) 58 min    Activity Tolerance Patient tolerated treatment well;No increased pain    Behavior During Therapy [facility] for tasks assessed/performed             Past Medical History:  Diagnosis Date   Arthritis    COPD, mild (HCC)    External hemorrhoids    symptomatic   GERD (gastroesophageal reflux disease)    History of adenomatous polyp of colon followed by dr Watt Climes   tubular adenoma's 1995;  2000;  2006;  2016   History of malignant melanoma of skin 2017   excision posterior neck (localized)   History of prostate cancer followed by pcp   1999--  radical prostatectomy/  per pt last PSA less than 0.1 on May 2017   Nocturia    Wears glasses     Past Surgical History:  Procedure Laterality Date   COLONOSCOPY  last one 07-27-2014  dr Watt Climes   ELBOW SURGERY Right 1989   HEMORRHOID SURGERY N/A 08/28/2016   Procedure: HEMORRHOIDECTOMY;  Surgeon: Clovis Riley, MD;  Location: [place];  Service: General;  Laterality: N/A;   KNEE ARTHROSCOPY Left 2002  approx.   MELANOMA EXCISION  2017   posterior neck (localized)   PROSTATECTOMY  1999   dr Risa Grill    There were no vitals filed for this visit.   Subjective Assessment - 02/11/21 0847     Subjective still pretty sore after fall, some improvement noted but pain is still increased prior to fall    Pertinent History  Prostate Cancer, GERD, Arthritis, COPD, knee arthroscopy left 2002    Limitations House hold activities;Other (comment)    Diagnostic tests Pre images:   1. Severe tendinosis of the supraspinatus tendon with a large  full-thickness tear measuring 2 cm in anterior-posterior dimension.  2. Moderate tendinosis of the infraspinatus tendon.  3. Moderate tendinosis of the intra-articular portion of the long  head of the biceps tendon.  4. Mild partial-thickness cartilage loss of the posterior glenoid  with subchondral reactive marrow changes.    Patient Stated Goals get back to fishing, working on old cars, gardening and shooting    Currently in Pain? Yes    Pain Score 3     Pain Location Shoulder    Pain Orientation Right    Pain Descriptors / Indicators Sore    Pain Type Surgical pain;Acute pain    Pain Onset More than a month ago    Pain Frequency Constant    Aggravating Factors  reaching, and overhead    Pain Relieving Factors ibuprofen, ice                [facility] PT Assessment - 02/11/21 0851       Assessment   Medical Diagnosis Z46.011D traumatic complete tear of rotator  cuff    Referring Provider (PT) Jean Rosenthal MD      Observation/Other Assessments   Observations no popeye deformity noted on the Rt      AROM   Right Shoulder Flexion 91 Degrees   sitting, 130 supine   Right Shoulder ABduction 86 Degrees   sitting   Right Shoulder Internal Rotation 53 Degrees    Right Shoulder External Rotation 63 Degrees      Strength   Overall Strength Comments not formally tested but pt able to provide resistance with all shoulder motions                           [facility] Adult PT Treatment/Exercise - 02/11/21 0001       Shoulder Exercises: Supine   Protraction AAROM;Right;20 reps;Limitations    Protraction Weight (lbs) 2    Protraction Limitations 3 seconds, Reach up towards ceiling palm in with L side helping    External Rotation AAROM;Right;10  reps;Limitations    External Rotation Limitations 10 sec hold with 1# bar    Flexion AAROM;10 reps   1# bar   Flexion Limitations then active with short lever arm Rt 2x10      Shoulder Exercises: Standing   Other Standing Exercises IR/ER walkout x 10 reps with L2 band      Modalities   Modalities Vasopneumatic      Vasopneumatic   Number Minutes Vasopneumatic  10 minutes    Vasopnuematic Location  Shoulder    Vasopneumatic Pressure Low    Vasopneumatic Temperature  34                       PT Short Term Goals - 02/04/21 5621       PT SHORT TERM GOAL #1   Title Pt will be independent in his initial HEP.    Status Achieved      PT SHORT TERM GOAL #2   Title Pt will be able to improve his passive right shoulder flexion to 90 degrees with pain </= 4/10.    Status Achieved               PT Long Term Goals - 02/04/21 3086       PT LONG TERM GOAL #1   Title Pt will improve his FOTO to >/= 63%    Status On-going      PT LONG TERM GOAL #2   Title Pt will  improve his right shoulder flexion to >/= 140 degrees.    Status On-going      PT LONG TERM GOAL #3   Title Pt will improve right passive ER to >/= 65 degrees.    Status On-going      PT LONG TERM GOAL #4   Title Pt will be independent in his advanced HEP following protocol progression.    Status On-going      PT LONG TERM GOAL #5   Title Pt will be able to report improved sleep by 50%.    Status Achieved                   Plan - 02/11/21 0941     Clinical Impression Statement Reassess AROM today without significant change from prior sessions so optimistic that RTC is still intact following fall last week.  Overall still with residual soreness which is expected.  Will continue to closely monitor and will recommend return to MD sooner  if symptoms change.  Able to tolerate exercises well today.  Will continue to benefit from PT to maximize function.    Personal Factors and Comorbidities  Comorbidity 3+    Comorbidities prostate CA, COPD, arthritis, GERD, left knee surgery    Examination-Activity Limitations Lift;Dressing;Carry;Transfers;Other;Bathing;Sleep    Stability/Clinical Decision Making Stable/Uncomplicated    Rehab Potential Good    PT Frequency 2x / week    PT Duration 4 weeks    PT Treatment/Interventions ADLs/Self Care Home Management;Cryotherapy;Electrical Stimulation;Iontophoresis 4mg /ml Dexamethasone;Ultrasound;Balance training;Therapeutic exercise;Therapeutic activities;Functional mobility training;Stair training;Gait training;Neuromuscular re-education;Patient/family education;Manual techniques;Passive range of motion;Dry needling;Taping;Vasopneumatic Device;Joint Manipulations;Moist Heat    PT Next Visit Plan Monitor AROM/strength following fall, Continue AROM work with strength progressions as appropriate.    PT Home Exercise Plan Access Code: [email or web address]  URL: [email or web address]  Date: 01/23/2021  Prepared by: Vista Mink    Exercises  Seated Biceps Curl - 2-3 x daily - 7 x weekly - 2 sets - 10 reps  Seated Scapular Retraction - 2 x daily - 6 x weekly - 1 sets - 20 reps  Supine Scapular Protraction in Flexion with Dumbbells - 2-3 x daily - 7 x weekly - 1 sets - 20 reps - 3 seconds hold  Supine Shoulder Internal Rotation Stretch - 2-3 x daily - 7 x weekly - 1 sets - 10-20 reps - 10 seconds hold  Supine Shoulder External Rotation Stretch - 2-3 x daily - 7 x weekly - 1 sets - 10-20 reps - 10 seconds hold  Seated Shoulder Flexion AAROM with Pulley Behind - 2 x daily - 7 x weekly - 1 sets - 10 reps - 5-10 seconds hold    Consulted and Agree with Plan of Care Patient             Patient will benefit from skilled therapeutic intervention in order to improve the following deficits and impairments:  Pain, Postural dysfunction, Decreased strength, Increased edema, Other (comment), Decreased activity tolerance, Impaired UE functional use, Decreased range of  motion, Impaired flexibility  Visit Diagnosis: Stiffness of right shoulder, not elsewhere classified  Acute pain of right shoulder  Muscle weakness (generalized)  Localized edema     Problem List Patient Active Problem List   Diagnosis Date Noted   History of prostate cancer 11/11/2020   Right shoulder pain 09/02/2020   History of hyperlipidemia, mixed 12/15/2017   Arthritis 12/14/2016   Gastroesophageal reflux disease without esophagitis 12/14/2016       Laureen Abrahams, PT, DPT 02/11/21 9:45 AM     [facility] [address] [place], [place], [address] Phone: [phone]   Fax:  [phone]  Name: Daniel Barber MRN: [id number] Date of Birth: 11-29-1950

## 2021-02-13 ENCOUNTER — Ambulatory Visit (INDEPENDENT_AMBULATORY_CARE_PROVIDER_SITE_OTHER): Payer: Medicare Other | Admitting: Rehabilitative and Restorative Service Providers"

## 2021-02-13 ENCOUNTER — Encounter: Payer: Self-pay | Admitting: Rehabilitative and Restorative Service Providers"

## 2021-02-13 ENCOUNTER — Encounter: Payer: Medicare Other | Admitting: Physical Therapy

## 2021-02-13 ENCOUNTER — Other Ambulatory Visit: Payer: Self-pay

## 2021-02-13 DIAGNOSIS — M6281 Muscle weakness (generalized): Secondary | ICD-10-CM

## 2021-02-13 DIAGNOSIS — R600 Localized edema: Secondary | ICD-10-CM | POA: Diagnosis not present

## 2021-02-13 DIAGNOSIS — M25511 Pain in right shoulder: Secondary | ICD-10-CM | POA: Diagnosis not present

## 2021-02-13 DIAGNOSIS — M25611 Stiffness of right shoulder, not elsewhere classified: Secondary | ICD-10-CM | POA: Diagnosis not present

## 2021-02-13 NOTE — Therapy (Signed)
[facility] [address] [place], [place], [address] Phone: [phone]   Fax:  [phone]  Physical Therapy Treatment/Progress Note  Patient Details  Name: Daniel Barber MRN: [id number] Date of Birth: 14-May-1950 Referring Provider (PT): Jean Rosenthal MD  Progress Note Reporting Period 12/18/2021 to 02/13/2021  See note below for Objective Data and Assessment of Progress/Goals.     Encounter Date: 02/13/2021   PT End of Session - 02/13/21 1721     Visit Number 15    Number of Visits 17    Date for PT Re-Evaluation 02/14/21    Authorization Type KX modifier after visit 15    Progress Note Due on Visit 76    PT Start Time 0845    PT Stop Time 0930    PT Time Calculation (min) 45 min    Activity Tolerance Patient tolerated treatment well    Behavior During Therapy [facility] for tasks assessed/performed             Past Medical History:  Diagnosis Date   Arthritis    COPD, mild (HCC)    External hemorrhoids    symptomatic   GERD (gastroesophageal reflux disease)    History of adenomatous polyp of colon followed by dr Watt Climes   tubular adenoma's 1995;  2000;  2006;  2016   History of malignant melanoma of skin 2017   excision posterior neck (localized)   History of prostate cancer followed by pcp   1999--  radical prostatectomy/  per pt last PSA less than 0.1 on May 2017   Nocturia    Wears glasses     Past Surgical History:  Procedure Laterality Date   COLONOSCOPY  last one 07-27-2014  dr Watt Climes   ELBOW SURGERY Right 1989   HEMORRHOID SURGERY N/A 08/28/2016   Procedure: HEMORRHOIDECTOMY;  Surgeon: Clovis Riley, MD;  Location: [place];  Service: General;  Laterality: N/A;   KNEE ARTHROSCOPY Left 2002  approx.   MELANOMA EXCISION  2017   posterior neck (localized)   PROSTATECTOMY  1999   dr Risa Grill    There were no vitals filed for this visit.   Subjective Assessment - 02/13/21 0902      Subjective Tom is using Aleve in the mornings.  Sleep is undisturbed.    Pertinent History Prostate Cancer, GERD, Arthritis, COPD, knee arthroscopy left 2002    Limitations House hold activities;Other (comment)    Diagnostic tests Pre images:   1. Severe tendinosis of the supraspinatus tendon with a large  full-thickness tear measuring 2 cm in anterior-posterior dimension.  2. Moderate tendinosis of the infraspinatus tendon.  3. Moderate tendinosis of the intra-articular portion of the long  head of the biceps tendon.  4. Mild partial-thickness cartilage loss of the posterior glenoid  with subchondral reactive marrow changes.    Patient Stated Goals Get back to fishing, working on old cars, gardening and shooting    Currently in Pain? Yes    Pain Score 3     Pain Location Shoulder    Pain Orientation Right    Pain Descriptors / Indicators Sore;Burning    Pain Type Surgical pain    Pain Radiating Towards Shoulder to elbow    Pain Onset More than a month ago    Pain Frequency Intermittent    Aggravating Factors  R UE use    Pain Relieving Factors Aleve, ice    Effect of Pain on Daily Activities Not fishing, gardening, shooting and has  to be careful with R UE use    Multiple Pain Sites No                [facility] PT Assessment - 02/13/21 0001       Observation/Other Assessments   Focus on Therapeutic Outcomes (FOTO)  55 (Goal 63, was 30)      ROM / Strength   AROM / PROM / Strength AROM;Strength      AROM   Overall AROM  Deficits    Overall AROM Comments Measured supine (IR/ER at 70 degrees abduction)    Right/Left Shoulder Left;Right    Right Shoulder Flexion 135 Degrees    Right Shoulder Internal Rotation 45 Degrees    Right Shoulder External Rotation 75 Degrees    Right Shoulder Horizontal  ADduction 15 Degrees    Left Shoulder Flexion 145 Degrees    Left Shoulder Internal Rotation 50 Degrees    Left Shoulder External Rotation 95 Degrees    Left Shoulder Horizontal ADduction  35 Degrees      Strength   Overall Strength Deficits    Strength Assessment Site Shoulder    Right/Left Shoulder Left;Right    Right Shoulder Internal Rotation --   28.7 pounds   Right Shoulder External Rotation --   3.4 pounds   Left Shoulder Internal Rotation --   33.9 pounds   Left Shoulder External Rotation --   22.2 pounds                          [facility] Adult PT Treatment/Exercise - 02/13/21 0001       Exercises   Exercises Shoulder      Shoulder Exercises: Supine   Protraction AAROM;Right;20 reps;Limitations    Protraction Weight (lbs) 4    Protraction Limitations 3 seconds, Reach up towards ceiling palm in with L side helping    External Rotation AAROM;Right;10 reps;Limitations    External Rotation Limitations 10 seconds at 70 degrees abduction and 3 pillows under elbow in supine    Internal Rotation AAROM;Right;10 reps;Limitations    Internal Rotation Limitations 10 seconds at 70 degrees abduction elbow on 3 pillows    Flexion AAROM;Right;20 reps;Limitations    Flexion Limitations L side helps, protract 1st (in comfortable range)      Shoulder Exercises: Seated   Retraction Strengthening;Both;10 reps;Limitations    Retraction Limitations 5 seconds      Shoulder Exercises: Standing   External Rotation Strengthening;Right;10 reps;Theraband;Limitations    Theraband Level (Shoulder External Rotation) Level 2 (Red)    External Rotation Limitations slow eccentrics 2 sets    Internal Rotation Strengthening;Right;10 reps;Theraband;Limitations    Theraband Level (Shoulder Internal Rotation) Level 2 (Red)    Internal Rotation Limitations slow eccentrics    Other Standing Exercises IR/ER walkout x 10 reps with L2 band    Other Standing Exercises Biceps curls with supination 20X 3 seconds 4# slow eccentrics      Shoulder Exercises: Pulleys   Flexion Limitations    Flexion Limitations Palm in, protract 1st in painfree range 10X 5 seconds    Scaption  Limitations    Scaption Limitations 10X 5 seconds Palm in, reach out 1st                     PT Education - 02/13/21 1723     Education Details Reviewed HEP and reassessment findings from today's assessment.    Person(s) Educated Patient    Methods Explanation;Demonstration;Verbal cues;Tactile  cues;Handout    Comprehension Verbalized understanding;Tactile cues required;Need further instruction;Returned demonstration;Verbal cues required              PT Short Term Goals - 02/04/21 0938       PT SHORT TERM GOAL #1   Title Pt will be independent in his initial HEP.    Status Achieved      PT SHORT TERM GOAL #2   Title Pt will be able to improve his passive right shoulder flexion to 90 degrees with pain </= 4/10.    Status Achieved               PT Long Term Goals - 02/13/21 1724       PT LONG TERM GOAL #1   Title Pt will improve his FOTO to >/= 63%    Baseline 55    Time 4    Period Weeks    Status On-going    Target Date 03/13/21      PT LONG TERM GOAL #2   Title Pt will  improve his right shoulder flexion to >/= 140 degrees.    Baseline 135    Time 4    Period Weeks    Status On-going    Target Date 03/13/21      PT LONG TERM GOAL #3   Title Pt will improve right passive ER to >/= 65 degrees.    Baseline 75 AROM    Status Achieved      PT LONG TERM GOAL #4   Title Pt will be independent in his advanced HEP following protocol progression.    Status Achieved      PT LONG TERM GOAL #5   Title Pt will be able to report improved sleep by 50%.    Status Achieved                   Plan - 02/13/21 1726     Clinical Impression Statement Gershon Mussel has made good overall progress with his post-surgical PT.  Of some concern is poor ER strength, particularly after a dog induced fall a little over a week ago.  He is sleeping good (a good sign) and AROM, although limited, is not any tighter than before his fall.  Given his remaining capsular  tightness, poor ER shoulder strength and recent fall, Tom will benefit from another 2-4 weeks of strength work before DC to independent PT.    Personal Factors and Comorbidities Comorbidity 3+    Comorbidities prostate CA, COPD, arthritis, GERD, left knee surgery    Examination-Activity Limitations Lift;Dressing;Carry;Transfers;Other;Bathing;Sleep    Stability/Clinical Decision Making Stable/Uncomplicated    Rehab Potential Good    PT Frequency 2x / week    PT Duration 4 weeks    PT Treatment/Interventions ADLs/Self Care Home Management;Cryotherapy;Electrical Stimulation;Iontophoresis 4mg /ml Dexamethasone;Ultrasound;Balance training;Therapeutic exercise;Therapeutic activities;Functional mobility training;Stair training;Gait training;Neuromuscular re-education;Patient/family education;Manual techniques;Passive range of motion;Dry needling;Taping;Vasopneumatic Device;Joint Manipulations;Moist Heat    PT Next Visit Plan Appropriate scapular and posterior RTC strength progressions, AROM and capsular flexibility work to meet LTGs.    PT Home Exercise Plan Access Code: [email or web address]  URL: [email or web address]  Date: 01/23/2021  Prepared by: Vista Mink    Exercises  Seated Biceps Curl - 2-3 x daily - 7 x weekly - 2 sets - 10 reps  Seated Scapular Retraction - 2 x daily - 6 x weekly - 1 sets - 20 reps  Supine Scapular Protraction in Flexion with Dumbbells - 2-3 x daily - 7 x weekly -  1 sets - 20 reps - 3 seconds hold  Supine Shoulder Internal Rotation Stretch - 2-3 x daily - 7 x weekly - 1 sets - 10-20 reps - 10 seconds hold  Supine Shoulder External Rotation Stretch - 2-3 x daily - 7 x weekly - 1 sets - 10-20 reps - 10 seconds hold  Seated Shoulder Flexion AAROM with Pulley Behind - 2 x daily - 7 x weekly - 1 sets - 10 reps - 5-10 seconds hold    Consulted and Agree with Plan of Care Patient             Patient will benefit from skilled therapeutic intervention in order to improve the  following deficits and impairments:  Pain, Postural dysfunction, Decreased strength, Increased edema, Other (comment), Decreased activity tolerance, Impaired UE functional use, Decreased range of motion, Impaired flexibility  Visit Diagnosis: Stiffness of right shoulder, not elsewhere classified  Acute pain of right shoulder  Muscle weakness (generalized)  Localized edema     Problem List Patient Active Problem List   Diagnosis Date Noted   History of prostate cancer 11/11/2020   Right shoulder pain 09/02/2020   History of hyperlipidemia, mixed 12/15/2017   Arthritis 12/14/2016   Gastroesophageal reflux disease without esophagitis 12/14/2016    Farley Ly, PT, MPT 02/13/2021, 5:29 PM  [facility] [address] [place], [place], [address] Phone: [phone]   Fax:  [phone]  Name: SRICHARAN LACOMB MRN: [id number] Date of Birth: 1950-06-30

## 2021-02-13 NOTE — Patient Instructions (Signed)
Access Code: [email or web address] URL: [email or web address] Date: 02/13/2021 Prepared by: Vista Mink  Exercises Seated Biceps Curl - 2-3 x daily - 7 x weekly - 2 sets - 10 reps Seated Scapular Retraction - 2 x daily - 6 x weekly - 1 sets - 20 reps Supine Scapular Protraction in Flexion with Dumbbells - 2-3 x daily - 7 x weekly - 1 sets - 20 reps - 3 seconds hold Supine Shoulder Internal Rotation Stretch - 2-3 x daily - 7 x weekly - 1 sets - 10-20 reps - 10 seconds hold Supine Shoulder External Rotation Stretch - 2-3 x daily - 7 x weekly - 1 sets - 10-20 reps - 10 seconds hold Seated Shoulder Flexion AAROM with Pulley Behind - 2 x daily - 7 x weekly - 1 sets - 10 reps - 5-10 seconds hold Standing Shoulder Posterior Capsule Stretch - 2 x daily - 7 x weekly - 1 sets - 10 reps - 10 seconds hold

## 2021-02-17 ENCOUNTER — Encounter: Payer: Medicare Other | Admitting: Physical Therapy

## 2021-02-18 ENCOUNTER — Ambulatory Visit (INDEPENDENT_AMBULATORY_CARE_PROVIDER_SITE_OTHER): Payer: Medicare Other | Admitting: Physical Therapy

## 2021-02-18 ENCOUNTER — Other Ambulatory Visit: Payer: Self-pay

## 2021-02-18 DIAGNOSIS — M25611 Stiffness of right shoulder, not elsewhere classified: Secondary | ICD-10-CM | POA: Diagnosis not present

## 2021-02-18 DIAGNOSIS — R600 Localized edema: Secondary | ICD-10-CM | POA: Diagnosis not present

## 2021-02-18 DIAGNOSIS — M25511 Pain in right shoulder: Secondary | ICD-10-CM

## 2021-02-18 DIAGNOSIS — M6281 Muscle weakness (generalized): Secondary | ICD-10-CM | POA: Diagnosis not present

## 2021-02-18 NOTE — Therapy (Signed)
[facility] [address] [place], [place], [address] Phone: [phone]   Fax:  [phone]  Physical Therapy Treatment  Patient Details  Name: Daniel Barber MRN: [id number] Date of Birth: December 07, 1950 Referring Provider (PT): Jean Rosenthal MD   Encounter Date: 02/18/2021   PT End of Session - 02/18/21 0945     Visit Number 16    Number of Visits 25    Date for PT Re-Evaluation 03/13/21    Authorization Type KX modifier after visit 15    Progress Note Due on Visit 25    PT Start Time 0928    PT Stop Time 1010    PT Time Calculation (min) 42 min    Activity Tolerance Patient tolerated treatment well    Behavior During Therapy [facility] for tasks assessed/performed             Past Medical History:  Diagnosis Date   Arthritis    COPD, mild ([place])    External hemorrhoids    symptomatic   GERD (gastroesophageal reflux disease)    History of adenomatous polyp of colon followed by dr Watt Climes   tubular adenoma's 1995;  2000;  2006;  2016   History of malignant melanoma of skin 2017   excision posterior neck (localized)   History of prostate cancer followed by pcp   1999--  radical prostatectomy/  per pt last PSA less than 0.1 on May 2017   Nocturia    Wears glasses     Past Surgical History:  Procedure Laterality Date   COLONOSCOPY  last one 07-27-2014  dr Watt Climes   ELBOW SURGERY Right 1989   HEMORRHOID SURGERY N/A 08/28/2016   Procedure: HEMORRHOIDECTOMY;  Surgeon: Clovis Riley, MD;  Location: [place];  Service: General;  Laterality: N/A;   KNEE ARTHROSCOPY Left 2002  approx.   MELANOMA EXCISION  2017   posterior neck (localized)   PROSTATECTOMY  1999   dr Risa Grill    There were no vitals filed for this visit.   Subjective Assessment - 02/18/21 0944     Subjective he relays no pain in his Rt shoulder upon arrival.    Pertinent History Prostate Cancer, GERD, Arthritis, COPD, knee arthroscopy left 2002     Limitations House hold activities;Other (comment)    Diagnostic tests Pre images:   1. Severe tendinosis of the supraspinatus tendon with a large  full-thickness tear measuring 2 cm in anterior-posterior dimension.  2. Moderate tendinosis of the infraspinatus tendon.  3. Moderate tendinosis of the intra-articular portion of the long  head of the biceps tendon.  4. Mild partial-thickness cartilage loss of the posterior glenoid  with subchondral reactive marrow changes.    Patient Stated Goals Get back to fishing, working on old cars, gardening and shooting    Pain Onset More than a month ago                               [facility] Adult PT Treatment/Exercise - 02/18/21 0001       Shoulder Exercises: Standing   External Rotation Strengthening;Right;10 reps;Theraband;Limitations    Theraband Level (Shoulder External Rotation) Level 2 (Red)    External Rotation Limitations slow eccentrics 2 sets    Internal Rotation Strengthening;Right;10 reps;Theraband;Limitations    Theraband Level (Shoulder Internal Rotation) Level 3 (Green)    Internal Rotation Limitations slow eccentrics    Flexion Both;10 reps    Shoulder Flexion Weight (  lbs) 1    Flexion Limitations 2 sets   cues to avoid shrug   ABduction Both;10 reps    Shoulder ABduction Weight (lbs) 1    ABduction Limitations 2 sets   cues to avoid shrug   Other Standing Exercises ER walkout 2x 10 reps with L2 band    Other Standing Exercises Biceps curls with supination 20X 3 seconds 5# slow eccentrics, bent over Rt shoulder extensions with left UE support 5# X20, Rt shoulder bent over rows with left UE support 10# X 20      Shoulder Exercises: Pulleys   Flexion 2 minutes    Flexion Limitations holding 5 sec    ABduction 2 minutes    ABduction Limitations holding 5 sec      Shoulder Exercises: ROM/Strengthening   UBE (Upper Arm Bike) L3 for 5 minutes (2.43minutes each direction)      Shoulder Exercises: Stretch   Other  Shoulder Stretches doorway ER stretch 10 sec x10                       PT Short Term Goals - 02/04/21 1950       PT SHORT TERM GOAL #1   Title Pt will be independent in his initial HEP.    Status Achieved      PT SHORT TERM GOAL #2   Title Pt will be able to improve his passive right shoulder flexion to 90 degrees with pain </= 4/10.    Status Achieved               PT Long Term Goals - 02/13/21 1724       PT LONG TERM GOAL #1   Title Pt will improve his FOTO to >/= 63%    Baseline 55    Time 4    Period Weeks    Status On-going    Target Date 03/13/21      PT LONG TERM GOAL #2   Title Pt will  improve his right shoulder flexion to >/= 140 degrees.    Baseline 135    Time 4    Period Weeks    Status On-going    Target Date 03/13/21      PT LONG TERM GOAL #3   Title Pt will improve right passive ER to >/= 65 degrees.    Baseline 75 AROM    Status Achieved      PT LONG TERM GOAL #4   Title Pt will be independent in his advanced HEP following protocol progression.    Status Achieved      PT LONG TERM GOAL #5   Title Pt will be able to report improved sleep by 50%.    Status Achieved                   Plan - 02/18/21 1053     Clinical Impression Statement We continued to work to improve his funcitonal use of his Rt UE with shoulder strengthening and ROM exercises to his tolerance. He will continue to benefit from skilled PT to progress these deficits.    Personal Factors and Comorbidities Comorbidity 3+    Comorbidities prostate CA, COPD, arthritis, GERD, left knee surgery    Examination-Activity Limitations Lift;Dressing;Carry;Transfers;Other;Bathing;Sleep    Stability/Clinical Decision Making Stable/Uncomplicated    Rehab Potential Good    PT Frequency 2x / week    PT Duration 4 weeks    PT Treatment/Interventions ADLs/Self Care Home Management;Cryotherapy;Electrical Stimulation;Iontophoresis  4mg /ml Dexamethasone;Ultrasound;Balance  training;Therapeutic exercise;Therapeutic activities;Functional mobility training;Stair training;Gait training;Neuromuscular re-education;Patient/family education;Manual techniques;Passive range of motion;Dry needling;Taping;Vasopneumatic Device;Joint Manipulations;Moist Heat    PT Next Visit Plan Appropriate scapular and posterior RTC strength progressions, AROM and capsular flexibility work to meet LTGs.    PT Home Exercise Plan Access Code: [email or web address]  URL: [email or web address]  Date: 01/23/2021  Prepared by: Vista Mink    Exercises  Seated Biceps Curl - 2-3 x daily - 7 x weekly - 2 sets - 10 reps  Seated Scapular Retraction - 2 x daily - 6 x weekly - 1 sets - 20 reps  Supine Scapular Protraction in Flexion with Dumbbells - 2-3 x daily - 7 x weekly - 1 sets - 20 reps - 3 seconds hold  Supine Shoulder Internal Rotation Stretch - 2-3 x daily - 7 x weekly - 1 sets - 10-20 reps - 10 seconds hold  Supine Shoulder External Rotation Stretch - 2-3 x daily - 7 x weekly - 1 sets - 10-20 reps - 10 seconds hold  Seated Shoulder Flexion AAROM with Pulley Behind - 2 x daily - 7 x weekly - 1 sets - 10 reps - 5-10 seconds hold    Consulted and Agree with Plan of Care Patient             Patient will benefit from skilled therapeutic intervention in order to improve the following deficits and impairments:  Pain, Postural dysfunction, Decreased strength, Increased edema, Other (comment), Decreased activity tolerance, Impaired UE functional use, Decreased range of motion, Impaired flexibility  Visit Diagnosis: Stiffness of right shoulder, not elsewhere classified  Acute pain of right shoulder  Muscle weakness (generalized)  Localized edema     Problem List Patient Active Problem List   Diagnosis Date Noted   History of prostate cancer 11/11/2020   Right shoulder pain 09/02/2020   History of hyperlipidemia, mixed 12/15/2017   Arthritis 12/14/2016   Gastroesophageal reflux disease  without esophagitis 12/14/2016    Debbe Odea, PT,DPT 02/18/2021, 11:09 AM  [facility] Physical Therapy [address] [place], [place], [address] Phone: [phone]   Fax:  [phone]  Name: Daniel Barber MRN: [id number] Date of Birth: 28-Apr-1950

## 2021-02-20 ENCOUNTER — Other Ambulatory Visit: Payer: Self-pay

## 2021-02-20 ENCOUNTER — Ambulatory Visit (INDEPENDENT_AMBULATORY_CARE_PROVIDER_SITE_OTHER): Payer: Medicare Other | Admitting: Rehabilitative and Restorative Service Providers"

## 2021-02-20 ENCOUNTER — Encounter: Payer: Medicare Other | Admitting: Rehabilitative and Restorative Service Providers"

## 2021-02-20 ENCOUNTER — Encounter: Payer: Self-pay | Admitting: Rehabilitative and Restorative Service Providers"

## 2021-02-20 DIAGNOSIS — M6281 Muscle weakness (generalized): Secondary | ICD-10-CM | POA: Diagnosis not present

## 2021-02-20 DIAGNOSIS — M25611 Stiffness of right shoulder, not elsewhere classified: Secondary | ICD-10-CM

## 2021-02-20 DIAGNOSIS — M25511 Pain in right shoulder: Secondary | ICD-10-CM | POA: Diagnosis not present

## 2021-02-20 DIAGNOSIS — D225 Melanocytic nevi of trunk: Secondary | ICD-10-CM | POA: Diagnosis not present

## 2021-02-20 DIAGNOSIS — R600 Localized edema: Secondary | ICD-10-CM

## 2021-02-20 DIAGNOSIS — D2372 Other benign neoplasm of skin of left lower limb, including hip: Secondary | ICD-10-CM | POA: Diagnosis not present

## 2021-02-20 DIAGNOSIS — Z85820 Personal history of malignant melanoma of skin: Secondary | ICD-10-CM | POA: Diagnosis not present

## 2021-02-20 DIAGNOSIS — L570 Actinic keratosis: Secondary | ICD-10-CM | POA: Diagnosis not present

## 2021-02-20 DIAGNOSIS — Z85828 Personal history of other malignant neoplasm of skin: Secondary | ICD-10-CM | POA: Diagnosis not present

## 2021-02-20 DIAGNOSIS — L821 Other seborrheic keratosis: Secondary | ICD-10-CM | POA: Diagnosis not present

## 2021-02-20 DIAGNOSIS — D224 Melanocytic nevi of scalp and neck: Secondary | ICD-10-CM | POA: Diagnosis not present

## 2021-02-20 NOTE — Patient Instructions (Signed)
Access Code: [email or web address] URL: [email or web address] Date: 02/20/2021 Prepared by: Vista Mink  Exercises Seated Biceps Curl - 2-3 x daily - 7 x weekly - 2 sets - 10 reps Seated Scapular Retraction - 2 x daily - 6 x weekly - 1 sets - 20 reps Supine Scapular Protraction in Flexion with Dumbbells - 2-3 x daily - 7 x weekly - 1 sets - 20 reps - 3 seconds hold Supine Shoulder Internal Rotation Stretch - 2-3 x daily - 7 x weekly - 1 sets - 10-20 reps - 10 seconds hold Supine Shoulder External Rotation Stretch - 2-3 x daily - 7 x weekly - 1 sets - 10-20 reps - 10 seconds hold Seated Shoulder Flexion AAROM with Pulley Behind - 2 x daily - 7 x weekly - 1 sets - 10 reps - 5-10 seconds hold Standing Shoulder Posterior Capsule Stretch - 2 x daily - 7 x weekly - 1 sets - 10 reps - 10 seconds hold Shoulder External Rotation with Anchored Resistance with Towel Under Elbow - 1 x daily - 7 x weekly - 2-3 sets - 10 reps - 3 hold

## 2021-02-20 NOTE — Therapy (Signed)
[facility] [address] [place], [place], [address] Phone: [phone]   Fax:  [phone]  Physical Therapy Treatment  Patient Details  Name: Daniel Barber MRN: [id number] Date of Birth: 11-Jan-1951 Referring Provider (PT): Jean Rosenthal MD   Encounter Date: 02/20/2021   PT End of Session - 02/20/21 1640     Visit Number 17    Number of Visits 25    Date for PT Re-Evaluation 03/13/21    Authorization Type KX modifier after visit 15    Progress Note Due on Visit 25    PT Start Time 1300    PT Stop Time 1345    PT Time Calculation (min) 45 min    Activity Tolerance Patient tolerated treatment well;No increased pain    Behavior During Therapy [facility] for tasks assessed/performed             Past Medical History:  Diagnosis Date   Arthritis    COPD, mild ([facility])    External hemorrhoids    symptomatic   GERD (gastroesophageal reflux disease)    History of adenomatous polyp of colon followed by dr Watt Climes   tubular adenoma's 1995;  2000;  2006;  2016   History of malignant melanoma of skin 2017   excision posterior neck (localized)   History of prostate cancer followed by pcp   1999--  radical prostatectomy/  per pt last PSA less than 0.1 on May 2017   Nocturia    Wears glasses     Past Surgical History:  Procedure Laterality Date   COLONOSCOPY  last one 07-27-2014  dr Watt Climes   ELBOW SURGERY Right 1989   HEMORRHOID SURGERY N/A 08/28/2016   Procedure: HEMORRHOIDECTOMY;  Surgeon: Clovis Riley, MD;  Location: [place];  Service: General;  Laterality: N/A;   KNEE ARTHROSCOPY Left 2002  approx.   MELANOMA EXCISION  2017   posterior neck (localized)   PROSTATECTOMY  1999   dr Risa Grill    There were no vitals filed for this visit.   Subjective Assessment - 02/20/21 1307     Subjective Tom reports his R shoulder is much better than the last time I saw him (after his dog pulled him to the ground).     Pertinent History Prostate Cancer, GERD, Arthritis, COPD, knee arthroscopy left 2002    Limitations House hold activities;Other (comment)    Diagnostic tests Pre images:   1. Severe tendinosis of the supraspinatus tendon with a large  full-thickness tear measuring 2 cm in anterior-posterior dimension.  2. Moderate tendinosis of the infraspinatus tendon.  3. Moderate tendinosis of the intra-articular portion of the long  head of the biceps tendon.  4. Mild partial-thickness cartilage loss of the posterior glenoid  with subchondral reactive marrow changes.    Patient Stated Goals Get back to fishing, working on old cars, gardening and shooting    Currently in Pain? No/denies    Pain Score 0-No pain    Pain Location Shoulder    Pain Orientation Right    Pain Radiating Towards In the joint    Pain Onset More than a month ago    Pain Frequency Occasional    Aggravating Factors  Fatigue    Pain Relieving Factors Ice, Aleve 1/day (was 2)    Effect of Pain on Daily Activities Not fishing, gardening, shootiing and needs to be careful with R UE use    Multiple Pain Sites No  [facility] Adult PT Treatment/Exercise - 02/20/21 0001       Exercises   Exercises Shoulder      Shoulder Exercises: Supine   Protraction AAROM;Right;20 reps;Limitations    Protraction Weight (lbs) 4    Protraction Limitations 3 seconds, Reach up towards ceiling palm in with L side helping    External Rotation AAROM;Right;10 reps;Limitations    External Rotation Limitations 10 seconds at 70 degrees abduction and 3 pillows under elbow in supine    Internal Rotation AAROM;Right;10 reps;Limitations    Internal Rotation Limitations 10 seconds at 70 degrees abduction elbow on 3 pillows    Flexion AAROM;Right;20 reps;Limitations    Flexion Limitations L side helps, protract 1st (in comfortable range)      Shoulder Exercises: Seated   Retraction Strengthening;Both;10 reps;Limitations     Retraction Limitations 5 seconds      Shoulder Exercises: Sidelying   External Rotation Strengthening;Right;10 reps;Limitations    External Rotation Limitations towel under elbow, slow eccentrics      Shoulder Exercises: Standing   External Rotation Strengthening;Right;10 reps;Theraband;Limitations    Theraband Level (Shoulder External Rotation) Level 2 (Red)    External Rotation Limitations slow eccentrics 2 sets    Internal Rotation Strengthening;Right;10 reps;Theraband;Limitations    Theraband Level (Shoulder Internal Rotation) Level 3 (Green)    Internal Rotation Limitations slow eccentrics    Other Standing Exercises IR/ER walkout x 10 reps with L2 band    Other Standing Exercises Biceps curls with supination 20X 3 seconds 4# slow eccentrics      Shoulder Exercises: Pulleys   Flexion Limitations    Flexion Limitations Palm in, protract 1st in painfree range 10X 5 seconds    Scaption Limitations    Scaption Limitations 10X 5 seconds Palm in, reach out 1st                     PT Education - 02/20/21 1641     Education Details Reviewed and progressed strength components of HEP (added theraband to HEP).    Person(s) Educated Patient    Methods Explanation;Demonstration;Tactile cues;Verbal cues;Handout              PT Short Term Goals - 02/04/21 4166       PT SHORT TERM GOAL #1   Title Pt will be independent in his initial HEP.    Status Achieved      PT SHORT TERM GOAL #2   Title Pt will be able to improve his passive right shoulder flexion to 90 degrees with pain </= 4/10.    Status Achieved               PT Long Term Goals - 02/13/21 1724       PT LONG TERM GOAL #1   Title Pt will improve his FOTO to >/= 63%    Baseline 55    Time 4    Period Weeks    Status On-going    Target Date 03/13/21      PT LONG TERM GOAL #2   Title Pt will  improve his right shoulder flexion to >/= 140 degrees.    Baseline 135    Time 4    Period Weeks     Status On-going    Target Date 03/13/21      PT LONG TERM GOAL #3   Title Pt will improve right passive ER to >/= 65 degrees.    Baseline 75 AROM    Status Achieved  PT LONG TERM GOAL #4   Title Pt will be independent in his advanced HEP following protocol progression.    Status Achieved      PT LONG TERM GOAL #5   Title Pt will be able to report improved sleep by 50%.    Status Achieved                   Plan - 02/20/21 1641     Clinical Impression Statement Gershon Mussel is feeling much better with his R shoulder as compared to post dog related fall.  He still has significant ER weakness and AROM limitations affecting function.  Continue to address all impairments noted at reassessment to meet LTGs.    Personal Factors and Comorbidities Comorbidity 3+    Comorbidities prostate CA, COPD, arthritis, GERD, left knee surgery    Examination-Activity Limitations Lift;Dressing;Carry;Transfers;Other;Bathing;Sleep    Stability/Clinical Decision Making Stable/Uncomplicated    Rehab Potential Good    PT Frequency 2x / week    PT Duration 4 weeks    PT Treatment/Interventions ADLs/Self Care Home Management;Cryotherapy;Electrical Stimulation;Iontophoresis 4mg /ml Dexamethasone;Ultrasound;Balance training;Therapeutic exercise;Therapeutic activities;Functional mobility training;Stair training;Gait training;Neuromuscular re-education;Patient/family education;Manual techniques;Passive range of motion;Dry needling;Taping;Vasopneumatic Device;Joint Manipulations;Moist Heat    PT Next Visit Plan Appropriate scapular and posterior RTC strength progressions, AROM and capsular flexibility work to meet LTGs.    PT Home Exercise Plan Access Code: [email or web address]  URL: [email or web address]  Date: 01/23/2021  Prepared by: Vista Mink    Exercises  Seated Biceps Curl - 2-3 x daily - 7 x weekly - 2 sets - 10 reps  Seated Scapular Retraction - 2 x daily - 6 x weekly - 1 sets - 20 reps  Supine  Scapular Protraction in Flexion with Dumbbells - 2-3 x daily - 7 x weekly - 1 sets - 20 reps - 3 seconds hold  Supine Shoulder Internal Rotation Stretch - 2-3 x daily - 7 x weekly - 1 sets - 10-20 reps - 10 seconds hold  Supine Shoulder External Rotation Stretch - 2-3 x daily - 7 x weekly - 1 sets - 10-20 reps - 10 seconds hold  Seated Shoulder Flexion AAROM with Pulley Behind - 2 x daily - 7 x weekly - 1 sets - 10 reps - 5-10 seconds hold    Consulted and Agree with Plan of Care Patient             Patient will benefit from skilled therapeutic intervention in order to improve the following deficits and impairments:  Pain, Postural dysfunction, Decreased strength, Increased edema, Other (comment), Decreased activity tolerance, Impaired UE functional use, Decreased range of motion, Impaired flexibility  Visit Diagnosis: Stiffness of right shoulder, not elsewhere classified  Acute pain of right shoulder  Muscle weakness (generalized)  Localized edema     Problem List Patient Active Problem List   Diagnosis Date Noted   History of prostate cancer 11/11/2020   Right shoulder pain 09/02/2020   History of hyperlipidemia, mixed 12/15/2017   Arthritis 12/14/2016   Gastroesophageal reflux disease without esophagitis 12/14/2016    Farley Ly, PT, MPT 02/20/2021, 4:43 PM  [facility] [address] [place], [place], [address] Phone: [phone]   Fax:  [phone]  Name: KEYDEN PAVLOV MRN: [id number] Date of Birth: 29-Nov-1950

## 2021-02-25 ENCOUNTER — Encounter: Payer: Self-pay | Admitting: Physical Therapy

## 2021-02-25 ENCOUNTER — Ambulatory Visit (INDEPENDENT_AMBULATORY_CARE_PROVIDER_SITE_OTHER): Payer: Medicare Other | Admitting: Physical Therapy

## 2021-02-25 ENCOUNTER — Other Ambulatory Visit: Payer: Self-pay

## 2021-02-25 DIAGNOSIS — R600 Localized edema: Secondary | ICD-10-CM | POA: Diagnosis not present

## 2021-02-25 DIAGNOSIS — M25511 Pain in right shoulder: Secondary | ICD-10-CM | POA: Diagnosis not present

## 2021-02-25 DIAGNOSIS — M6281 Muscle weakness (generalized): Secondary | ICD-10-CM | POA: Diagnosis not present

## 2021-02-25 DIAGNOSIS — M25611 Stiffness of right shoulder, not elsewhere classified: Secondary | ICD-10-CM

## 2021-02-25 NOTE — Therapy (Signed)
[facility] Physical Therapy [address] [place], [place], [address] Phone: [phone]   Fax:  [phone]  Physical Therapy Treatment  Patient Details  Name: Daniel Barber MRN: [id number] Date of Birth: 04-Mar-1950 Referring Provider (PT): Jean Rosenthal MD   Encounter Date: 02/25/2021   PT End of Session - 02/25/21 1259     Visit Number 18    Number of Visits 25    Date for PT Re-Evaluation 03/13/21    Authorization Type KX modifier after visit 15    Progress Note Due on Visit 25    PT Start Time 1300    PT Stop Time 1340    PT Time Calculation (min) 40 min    Behavior During Therapy [facility] for tasks assessed/performed             Past Medical History:  Diagnosis Date   Arthritis    COPD, mild (HCC)    External hemorrhoids    symptomatic   GERD (gastroesophageal reflux disease)    History of adenomatous polyp of colon followed by dr Watt Climes   tubular adenoma's 1995;  2000;  2006;  2016   History of malignant melanoma of skin 2017   excision posterior neck (localized)   History of prostate cancer followed by pcp   1999--  radical prostatectomy/  per pt last PSA less than 0.1 on May 2017   Nocturia    Wears glasses     Past Surgical History:  Procedure Laterality Date   COLONOSCOPY  last one 07-27-2014  dr Watt Climes   ELBOW SURGERY Right 1989   HEMORRHOID SURGERY N/A 08/28/2016   Procedure: HEMORRHOIDECTOMY;  Surgeon: Clovis Riley, MD;  Location: [facility];  Service: General;  Laterality: N/A;   KNEE ARTHROSCOPY Left 2002  approx.   MELANOMA EXCISION  2017   posterior neck (localized)   PROSTATECTOMY  1999   dr Risa Grill    There were no vitals filed for this visit.   Subjective Assessment - 02/25/21 1302     Subjective Pt arriving today reporting no pain in his shoulder.    Pertinent History Prostate Cancer, GERD, Arthritis, COPD, knee arthroscopy left 2002    Limitations House hold activities;Other (comment)     Diagnostic tests Pre images:   1. Severe tendinosis of the supraspinatus tendon with a large  full-thickness tear measuring 2 cm in anterior-posterior dimension.  2. Moderate tendinosis of the infraspinatus tendon.  3. Moderate tendinosis of the intra-articular portion of the long  head of the biceps tendon.  4. Mild partial-thickness cartilage loss of the posterior glenoid  with subchondral reactive marrow changes.    Patient Stated Goals Get back to fishing, working on old cars, gardening and shooting    Currently in Pain? No/denies                               [facility] Adult PT Treatment/Exercise - 02/25/21 0001       Exercises   Exercises Shoulder      Shoulder Exercises: Standing   External Rotation Strengthening;Right;10 reps;Theraband;Limitations    Theraband Level (Shoulder External Rotation) Level 2 (Red)   3 sets   External Rotation Limitations slow eccentrics 2 sets    Internal Rotation Strengthening;Right;10 reps;Theraband;Limitations    Theraband Level (Shoulder Internal Rotation) Level 3 (Green)    Internal Rotation Limitations slow eccentrics    Flexion Strengthening    Shoulder Flexion Weight (lbs)  1    Flexion Limitations 2 sets    ABduction Strengthening;10 reps    Shoulder ABduction Weight (lbs) 1    ABduction Limitations 2 sets    Other Standing Exercises ER walkouts x 10 with level 2 band, forward leaning on mat table Rt UE row using 5 # weight 2x10 with 3 second scapular squeeze.    Other Standing Exercises walking red physioball up the wall with 5 second hold at end range x 10, Rt shouder extension with 5# weight x15      Shoulder Exercises: Pulleys   Flexion 3 minutes      Shoulder Exercises: ROM/Strengthening   UBE (Upper Arm Bike) L3 x 6 minutes total (3 minutes each direction)      Shoulder Exercises: Stretch   Other Shoulder Stretches door stretch at 60 degrees flexion for ER                       PT Short Term Goals  - 02/04/21 4742       PT SHORT TERM GOAL #1   Title Pt will be independent in his initial HEP.    Status Achieved      PT SHORT TERM GOAL #2   Title Pt will be able to improve his passive right shoulder flexion to 90 degrees with pain </= 4/10.    Status Achieved               PT Long Term Goals - 02/25/21 1306       PT LONG TERM GOAL #1   Title Pt will improve his FOTO to >/= 63%    Status On-going      PT LONG TERM GOAL #2   Title Pt will  improve his right shoulder flexion to >/= 140 degrees.    Status On-going      PT LONG TERM GOAL #3   Title Pt will improve right passive ER to >/= 65 degrees.    Status Achieved      PT LONG TERM GOAL #4   Title Pt will be independent in his advanced HEP following protocol progression.    Status Achieved      PT LONG TERM GOAL #5   Title Pt will be able to report improved sleep by 50%.    Status Achieved                   Plan - 02/25/21 1302     Clinical Impression Statement Pt arriving today with no pain reported. Pt continues to tolerate exercises well with limitations in overall strength and shoulder endurance for functional mobility. Conitnue skilled PT to maximize pt's function.    Personal Factors and Comorbidities Comorbidity 3+    Comorbidities prostate CA, COPD, arthritis, GERD, left knee surgery    Examination-Activity Limitations Lift;Dressing;Carry;Transfers;Other;Bathing;Sleep    Stability/Clinical Decision Making Stable/Uncomplicated    Rehab Potential Good    PT Frequency 2x / week    PT Next Visit Plan continue scapular and posterior RTC strength progressions, AROM and capsular flexibility    PT Home Exercise Plan Access Code: [email or web address]  URL: [email or web address]  Date: 01/23/2021  Prepared by: Vista Mink    Exercises  Seated Biceps Curl - 2-3 x daily - 7 x weekly - 2 sets - 10 reps  Seated Scapular Retraction - 2 x daily - 6 x weekly - 1 sets - 20 reps  Supine Scapular Protraction  in Flexion with Dumbbells - 2-3  x daily - 7 x weekly - 1 sets - 20 reps - 3 seconds hold  Supine Shoulder Internal Rotation Stretch - 2-3 x daily - 7 x weekly - 1 sets - 10-20 reps - 10 seconds hold  Supine Shoulder External Rotation Stretch - 2-3 x daily - 7 x weekly - 1 sets - 10-20 reps - 10 seconds hold  Seated Shoulder Flexion AAROM with Pulley Behind - 2 x daily - 7 x weekly - 1 sets - 10 reps - 5-10 seconds hold    Consulted and Agree with Plan of Care Patient             Patient will benefit from skilled therapeutic intervention in order to improve the following deficits and impairments:  Pain, Postural dysfunction, Decreased strength, Increased edema, Other (comment), Decreased activity tolerance, Impaired UE functional use, Decreased range of motion, Impaired flexibility  Visit Diagnosis: Stiffness of right shoulder, not elsewhere classified  Acute pain of right shoulder  Muscle weakness (generalized)  Localized edema     Problem List Patient Active Problem List   Diagnosis Date Noted   History of prostate cancer 11/11/2020   Right shoulder pain 09/02/2020   History of hyperlipidemia, mixed 12/15/2017   Arthritis 12/14/2016   Gastroesophageal reflux disease without esophagitis 12/14/2016    Oretha Caprice, PT, MPT 02/25/2021, 1:38 PM  [facility] Physical Therapy [address] [place], [place], [address] Phone: [phone]   Fax:  [phone]  Name: Daniel Barber MRN: [id number] Date of Birth: May 13, 1950

## 2021-02-27 ENCOUNTER — Ambulatory Visit (INDEPENDENT_AMBULATORY_CARE_PROVIDER_SITE_OTHER): Payer: Medicare Other | Admitting: Rehabilitative and Restorative Service Providers"

## 2021-02-27 ENCOUNTER — Encounter: Payer: Self-pay | Admitting: Rehabilitative and Restorative Service Providers"

## 2021-02-27 ENCOUNTER — Other Ambulatory Visit: Payer: Self-pay

## 2021-02-27 DIAGNOSIS — M25611 Stiffness of right shoulder, not elsewhere classified: Secondary | ICD-10-CM | POA: Diagnosis not present

## 2021-02-27 DIAGNOSIS — M6281 Muscle weakness (generalized): Secondary | ICD-10-CM

## 2021-02-27 DIAGNOSIS — R600 Localized edema: Secondary | ICD-10-CM | POA: Diagnosis not present

## 2021-02-27 NOTE — Therapy (Addendum)
[facility] Physical Therapy [address] [place], [place], [address] Phone: [phone]   Fax:  [phone]  Physical Therapy Treatment/Progress Note   Patient Details  Name: Daniel Barber MRN: [id number] Date of Birth: December 30, 1950 Referring Provider (PT): Jean Rosenthal MD  Progress Note Reporting Period 01/07/2021 to 02/27/2021  See note below for Objective Data and Assessment of Progress/Goals.     Encounter Date: 02/27/2021   PT End of Session - 02/27/21 1337     Visit Number 19    Number of Visits 25    Date for PT Re-Evaluation 03/13/21    Authorization Type KX modifier after visit 15    Progress Note Due on Visit 25    PT Start Time 8099    PT Stop Time 1341    PT Time Calculation (min) 44 min    Activity Tolerance Patient tolerated treatment well;No increased pain    Behavior During Therapy [facility] for tasks assessed/performed             Past Medical History:  Diagnosis Date   Arthritis    COPD, mild (HCC)    External hemorrhoids    symptomatic   GERD (gastroesophageal reflux disease)    History of adenomatous polyp of colon followed by dr Watt Climes   tubular adenoma's 1995;  2000;  2006;  2016   History of malignant melanoma of skin 2017   excision posterior neck (localized)   History of prostate cancer followed by pcp   1999--  radical prostatectomy/  per pt last PSA less than 0.1 on May 2017   Nocturia    Wears glasses     Past Surgical History:  Procedure Laterality Date   COLONOSCOPY  last one 07-27-2014  dr Watt Climes   ELBOW SURGERY Right 1989   HEMORRHOID SURGERY N/A 08/28/2016   Procedure: HEMORRHOIDECTOMY;  Surgeon: Clovis Riley, MD;  Location: [facility];  Service: General;  Laterality: N/A;   KNEE ARTHROSCOPY Left 2002  approx.   MELANOMA EXCISION  2017   posterior neck (localized)   PROSTATECTOMY  1999   dr Risa Grill    There were no vitals filed for this visit.   Subjective Assessment -  02/27/21 1305     Subjective R shoulder pain has been "pretty much 0/10 other than the exercises which cause soreness."  Sleep remains good.    Pertinent History Prostate Cancer, GERD, Arthritis, COPD, knee arthroscopy left 2002    Limitations House hold activities;Other (comment)    Diagnostic tests Pre images:   1. Severe tendinosis of the supraspinatus tendon with a large  full-thickness tear measuring 2 cm in anterior-posterior dimension.  2. Moderate tendinosis of the infraspinatus tendon.  3. Moderate tendinosis of the intra-articular portion of the long  head of the biceps tendon.  4. Mild partial-thickness cartilage loss of the posterior glenoid  with subchondral reactive marrow changes.    Patient Stated Goals Get back to fishing, working on old cars, gardening and shooting    Currently in Pain? No/denies    Pain Score 0-No pain    Pain Location Shoulder    Pain Orientation Right    Pain Onset More than a month ago    Pain Frequency Occasional    Aggravating Factors  Fatigue and soreness with exercises    Pain Relieving Factors Ice and 1 Aleve/day    Effect of Pain on Daily Activities No fishing, gardening, shooting and needs to be careful with R UE use  Multiple Pain Sites No                [facility] PT Assessment - 02/27/21 0001       AROM   Overall AROM  Deficits    Overall AROM Comments Measured supine (IR/ER at 70 degrees abduction)    Right/Left Shoulder Left;Right    Right Shoulder Flexion 140 Degrees    Right Shoulder Internal Rotation 50 Degrees    Right Shoulder External Rotation 75 Degrees    Right Shoulder Horizontal  ADduction 25 Degrees    Left Shoulder Flexion 155 Degrees    Left Shoulder Internal Rotation 50 Degrees    Left Shoulder External Rotation 90 Degrees    Left Shoulder Horizontal ADduction 40 Degrees      Strength   Overall Strength Deficits    Strength Assessment Site Shoulder    Right/Left Shoulder Left;Right    Right Shoulder Internal  Rotation --   28.7 pounds (same as 2 weeks ago)   Right Shoulder External Rotation --   5.3 pounds (was 3.4 pounds)   Left Shoulder Internal Rotation --   37.0 pounds   Left Shoulder External Rotation --   23.3 pounds                          [facility] Adult PT Treatment/Exercise - 02/27/21 0001       Exercises   Exercises Shoulder      Shoulder Exercises: Supine   Protraction AAROM;Right;20 reps;Limitations    Protraction Weight (lbs) 4    Protraction Limitations 3 seconds, Reach up towards ceiling palm in with L side helping    External Rotation AAROM;Right;10 reps;Limitations    External Rotation Limitations 10 seconds at 70 degrees abduction and 3 pillows under elbow in supine    Internal Rotation AAROM;Right;10 reps;Limitations    Internal Rotation Limitations 10 seconds at 70 degrees abduction elbow on 3 pillows    Flexion AAROM;Right;20 reps;Limitations    Flexion Limitations L side helps, protract 1st (in comfortable range)      Shoulder Exercises: Seated   Retraction Strengthening;Both;10 reps;Limitations    Retraction Limitations 5 seconds      Shoulder Exercises: Sidelying   External Rotation --    External Rotation Limitations --      Shoulder Exercises: Standing   External Rotation Strengthening;Right;10 reps;Theraband;Limitations    Theraband Level (Shoulder External Rotation) Level 2 (Red)    External Rotation Limitations slow eccentrics 2 sets    Internal Rotation Strengthening;Right;10 reps;Theraband;Limitations    Theraband Level (Shoulder Internal Rotation) Level 3 (Green)    Internal Rotation Limitations slow eccentrics    Other Standing Exercises IR/ER walkout x 10 reps with L2 band    Other Standing Exercises Biceps curls with supination 20X 3 seconds 4# slow eccentrics      Shoulder Exercises: Pulleys   Flexion Limitations    Flexion Limitations Palm in, protract 1st in painfree range 10X 5 seconds    Scaption Limitations    Scaption  Limitations 10X 5 seconds Palm in, reach out 1st                     PT Education - 02/27/21 1705     Education Details Reviewed exam findings and discussed transfer into independent PT after updating his final HEP next week.    Person(s) Educated Patient    Methods Explanation;Demonstration;Tactile cues;Verbal cues    Comprehension Verbalized understanding;Tactile cues required;Need further  instruction;Verbal cues required;Returned demonstration              PT Short Term Goals - 02/04/21 8101       PT SHORT TERM GOAL #1   Title Pt will be independent in his initial HEP.    Status Achieved      PT SHORT TERM GOAL #2   Title Pt will be able to improve his passive right shoulder flexion to 90 degrees with pain </= 4/10.    Status Achieved               PT Long Term Goals - 02/27/21 1707       PT LONG TERM GOAL #1   Title Pt will improve his FOTO to >/= 63%    Baseline 61 (was 30)    Time 2    Period Weeks    Status On-going    Target Date 03/13/21      PT LONG TERM GOAL #2   Title Pt will  improve his right shoulder flexion to >/= 140 degrees.    Baseline See objective    Status Achieved    Target Date 03/13/21      PT LONG TERM GOAL #3   Title Pt will improve right passive ER to >/= 65 degrees.    Baseline See objective    Status Achieved      PT LONG TERM GOAL #4   Title Pt will be independent in his advanced HEP following protocol progression.    Baseline Updated next week    Time 2    Period Weeks    Status On-going    Target Date 03/13/21      PT LONG TERM GOAL #5   Title Pt will be able to report improved sleep by 50%.    Baseline Sleep is now uninterrupted    Status Achieved                   Plan - 02/27/21 1709     Clinical Impression Statement Daniel Barber has 86% AROM and 56% strength 11 weeks post-surgery (90% AROM and 60% strength expected 12 weeks post-surgery).  With continued independent exercise, I expect he will  meet remaining LTGs.  I recommend he attend his follow-up visits next week to weed down and review his long-term independent PT before DC into independent rehabilitation.    Personal Factors and Comorbidities Comorbidity 3+    Comorbidities prostate CA, COPD, arthritis, GERD, left knee surgery    Examination-Activity Limitations Lift;Dressing;Carry;Transfers;Other;Bathing;Sleep    Stability/Clinical Decision Making Stable/Uncomplicated    Rehab Potential Good    PT Frequency 2x / week    PT Duration Other (comment)   1 week   PT Treatment/Interventions ADLs/Self Care Home Management;Cryotherapy;Electrical Stimulation;Iontophoresis 4mg /ml Dexamethasone;Ultrasound;Balance training;Therapeutic exercise;Therapeutic activities;Functional mobility training;Stair training;Gait training;Neuromuscular re-education;Patient/family education;Manual techniques;Passive range of motion;Dry needling;Taping;Vasopneumatic Device;Joint Manipulations;Moist Heat    PT Next Visit Plan continue scapular and posterior RTC strength progressions, AROM and capsular flexibility    PT Home Exercise Plan Access Code: [email or web address]  URL: [email or web address]  Date: 01/23/2021  Prepared by: Vista Mink    Exercises  Seated Biceps Curl - 2-3 x daily - 7 x weekly - 2 sets - 10 reps  Seated Scapular Retraction - 2 x daily - 6 x weekly - 1 sets - 20 reps  Supine Scapular Protraction in Flexion with Dumbbells - 2-3 x daily - 7 x weekly - 1 sets - 20 reps - 3 seconds hold  Supine Shoulder Internal Rotation Stretch - 2-3 x daily - 7 x weekly - 1 sets - 10-20 reps - 10 seconds hold  Supine Shoulder External Rotation Stretch - 2-3 x daily - 7 x weekly - 1 sets - 10-20 reps - 10 seconds hold  Seated Shoulder Flexion AAROM with Pulley Behind - 2 x daily - 7 x weekly - 1 sets - 10 reps - 5-10 seconds hold    Consulted and Agree with Plan of Care Patient             Patient will benefit from skilled therapeutic intervention in  order to improve the following deficits and impairments:  Pain, Postural dysfunction, Decreased strength, Increased edema, Other (comment), Decreased activity tolerance, Impaired UE functional use, Decreased range of motion, Impaired flexibility  Visit Diagnosis: Stiffness of right shoulder, not elsewhere classified  Muscle weakness (generalized)  Localized edema      Problem List Patient Active Problem List   Diagnosis Date Noted   History of prostate cancer 11/11/2020   Right shoulder pain 09/02/2020   History of hyperlipidemia, mixed 12/15/2017   Arthritis 12/14/2016   Gastroesophageal reflux disease without esophagitis 12/14/2016    Farley Ly, PT, MPT 02/27/2021, 5:11 PM  [facility] [address] [place], [place], [address] Phone: [phone]   Fax:  [phone]  Name: Daniel Barber MRN: [id number] Date of Birth: [birth year]  Kearney Hard, PT, MPT 03/03/21 4:32 PM

## 2021-02-27 NOTE — Patient Instructions (Signed)
[place]

## 2021-03-03 ENCOUNTER — Encounter: Payer: Self-pay | Admitting: Orthopaedic Surgery

## 2021-03-03 ENCOUNTER — Ambulatory Visit (INDEPENDENT_AMBULATORY_CARE_PROVIDER_SITE_OTHER): Payer: Medicare Other | Admitting: Orthopaedic Surgery

## 2021-03-03 ENCOUNTER — Other Ambulatory Visit: Payer: Self-pay

## 2021-03-03 DIAGNOSIS — Z9889 Other specified postprocedural states: Secondary | ICD-10-CM

## 2021-03-03 DIAGNOSIS — S46011D Strain of muscle(s) and tendon(s) of the rotator cuff of right shoulder, subsequent encounter: Secondary | ICD-10-CM

## 2021-03-03 NOTE — Progress Notes (Signed)
The patient is a 71 year old gentleman who is now 3 months status post a right shoulder arthroscopic rotator cuff repair.  He finishes therapy he states this week he thinks.  He is been participating in a home exercise program as well.  He does report increased range of motion and strength of the shoulder and minimal discomfort.  He is able to perform his actives daily living he states.  He is overall satisfied.  He understands it still takes a good 5 to 6 months to recover from this type of surgery at his age there is a high rerupture rate.  On exam his function of the shoulder is much better on the right side in terms of abduction and forward flexion as well as external rotation.  He has better strength as well.  He will continue to transition with his shoulder to home exercise program.  All questions and concerns were answered and addressed.  At this point follow-up can be as needed.

## 2021-03-04 ENCOUNTER — Ambulatory Visit (INDEPENDENT_AMBULATORY_CARE_PROVIDER_SITE_OTHER): Payer: Medicare Other | Admitting: Physical Therapy

## 2021-03-04 ENCOUNTER — Encounter: Payer: Self-pay | Admitting: Physical Therapy

## 2021-03-04 DIAGNOSIS — R600 Localized edema: Secondary | ICD-10-CM | POA: Diagnosis not present

## 2021-03-04 DIAGNOSIS — M25611 Stiffness of right shoulder, not elsewhere classified: Secondary | ICD-10-CM | POA: Diagnosis not present

## 2021-03-04 DIAGNOSIS — M25511 Pain in right shoulder: Secondary | ICD-10-CM | POA: Diagnosis not present

## 2021-03-04 DIAGNOSIS — M6281 Muscle weakness (generalized): Secondary | ICD-10-CM | POA: Diagnosis not present

## 2021-03-04 NOTE — Therapy (Addendum)
[facility] [address] [place], [place], [address] Phone: [phone]   Fax:  [phone]  Physical Therapy Treatment  Patient Details  Name: Daniel Barber MRN: [id number] Date of Birth: 09-Jun-1950 Referring Provider (PT): Jean Rosenthal MD   Encounter Date: 03/04/2021   PT End of Session - 03/04/21 0942     Visit Number 20    Number of Visits 25    Date for PT Re-Evaluation 03/13/21    Authorization Type KX modifier after visit 15    Progress Note Due on Visit 25    PT Start Time 0940    PT Stop Time 1015    PT Time Calculation (min) 35 min    Activity Tolerance Patient tolerated treatment well;No increased pain    Behavior During Therapy [facility] for tasks assessed/performed             Past Medical History:  Diagnosis Date   Arthritis    COPD, mild (HCC)    External hemorrhoids    symptomatic   GERD (gastroesophageal reflux disease)    History of adenomatous polyp of colon followed by dr Watt Climes   tubular adenoma's 1995;  2000;  2006;  2016   History of malignant melanoma of skin 2017   excision posterior neck (localized)   History of prostate cancer followed by pcp   1999--  radical prostatectomy/  per pt last PSA less than 0.1 on May 2017   Nocturia    Wears glasses     Past Surgical History:  Procedure Laterality Date   COLONOSCOPY  last one 07-27-2014  dr Watt Climes   ELBOW SURGERY Right 1989   HEMORRHOID SURGERY N/A 08/28/2016   Procedure: HEMORRHOIDECTOMY;  Surgeon: Clovis Riley, MD;  Location: [place];  Service: General;  Laterality: N/A;   KNEE ARTHROSCOPY Left 2002  approx.   MELANOMA EXCISION  2017   posterior neck (localized)   PROSTATECTOMY  1999   dr Risa Grill    There were no vitals filed for this visit.   Subjective Assessment - 03/04/21 0945     Subjective Pt stating after his visit with Dr. Ninfa Linden yesterday he cancelled all of his appointments after this week. Pt is hear  for one more visit to update his final HEP on Thursday.    Currently in Pain? Yes    Pain Score 1     Pain Location Shoulder    Pain Orientation Right    Pain Descriptors / Indicators Sore    Pain Type Surgical pain                               [facility] Adult PT Treatment/Exercise - 03/04/21 0001       Exercises   Exercises Shoulder      Shoulder Exercises: Seated   Row Limitations BATCA: 10# 2x15    Other Seated Exercises BATCA: lat pull downs 15# 2x 15      Shoulder Exercises: Standing   External Rotation Strengthening;Right;10 reps;Theraband;Limitations    Theraband Level (Shoulder External Rotation) Level 3 (Green)    External Rotation Limitations slow eccentrics 2 sets    Internal Rotation Strengthening;Right;10 reps;Theraband;Limitations    Theraband Level (Shoulder Internal Rotation) Level 4 (Blue)    Internal Rotation Limitations slow eccentrics    Other Standing Exercises walll ladder to 4th from top x 10, standing shoulder flexion using 2# bar x 10, shoulder extension 2# bar x  10, shouder abd with 2# bar x 10    Other Standing Exercises bicep curls 5# 2x10      Shoulder Exercises: ROM/Strengthening   UBE (Upper Arm Bike) L3 x 8 minutes (4 each direction)      Shoulder Exercises: Stretch   Other Shoulder Stretches Door stretch: flexion x 2 holding 30 seconds and ER x 2 holding 30 seconds                       PT Short Term Goals - 03/04/21 0951       PT SHORT TERM GOAL #1   Title Pt will be independent in his initial HEP.    Status Achieved      PT SHORT TERM GOAL #2   Title Pt will be able to improve his passive right shoulder flexion to 90 degrees with pain </= 4/10.    Status Achieved               PT Long Term Goals - 03/04/21 0957       PT LONG TERM GOAL #1   Title Pt will improve his FOTO to >/= 63%    Status On-going      PT LONG TERM GOAL #2   Title Pt will  improve his right shoulder flexion to >/= 140  degrees.    Status Achieved      PT LONG TERM GOAL #3   Title Pt will improve right passive ER to >/= 65 degrees.    Status Achieved      PT LONG TERM GOAL #4   Title Pt will be independent in his advanced HEP following protocol progression.    Status On-going      PT LONG TERM GOAL #5   Title Pt will be able to report improved sleep by 50%.    Status Achieved                   Plan - 03/04/21 2633     Clinical Impression Statement Pt has made great progress with therapy for ROM. Still progresing with continued strengthening. Pt to attend one additional skilled PT visit for final update of his HEP and progression toward gym program. Needs FOTO update and then discharge at next visit.    Personal Factors and Comorbidities Comorbidity 3+    Comorbidities Prostate CA, COPD, arthritis, GERD, left knee surgery    Examination-Activity Limitations Lift;Reach Overhead    Examination-Participation Restrictions Community Activity    Stability/Clinical Decision Making Stable/Uncomplicated    Rehab Potential Good    PT Frequency 2x / week    PT Treatment/Interventions ADLs/Self Care Home Management;Cryotherapy;Electrical Stimulation;Functional mobility training;Therapeutic activities;Therapeutic exercise;Neuromuscular re-education;Manual techniques;Passive range of motion;Dry needling;Taping;Vasopneumatic Device;Joint Manipulations;Moist Heat    PT Next Visit Plan finalize pt's HEP, FOTO    PT Home Exercise Plan [id number]    Consulted and Agree with Plan of Care Patient             Patient will benefit from skilled therapeutic intervention in order to improve the following deficits and impairments:  Pain, Postural dysfunction, Decreased strength, Decreased range of motion, Decreased activity tolerance, Impaired UE functional use, Impaired flexibility  Visit Diagnosis: Stiffness of right shoulder, not elsewhere classified  Muscle weakness (generalized)  Acute pain of right  shoulder  Localized edema     Problem List Patient Active Problem List   Diagnosis Date Noted   History of prostate cancer 11/11/2020   Right shoulder  pain 09/02/2020   History of hyperlipidemia, mixed 12/15/2017   Arthritis 12/14/2016   Gastroesophageal reflux disease without esophagitis 12/14/2016    Oretha Caprice, PT, MPT 03/04/2021, 10:11 AM  [facility] [address] [place], [place], [address] Phone: [phone]   Fax:  [phone]  Name: Daniel Barber MRN: [id number] Date of Birth: 1950/08/22

## 2021-03-06 ENCOUNTER — Other Ambulatory Visit: Payer: Self-pay

## 2021-03-06 ENCOUNTER — Encounter: Payer: Self-pay | Admitting: Rehabilitative and Restorative Service Providers"

## 2021-03-06 ENCOUNTER — Ambulatory Visit (INDEPENDENT_AMBULATORY_CARE_PROVIDER_SITE_OTHER): Payer: Medicare Other | Admitting: Rehabilitative and Restorative Service Providers"

## 2021-03-06 DIAGNOSIS — R600 Localized edema: Secondary | ICD-10-CM | POA: Diagnosis not present

## 2021-03-06 DIAGNOSIS — M25511 Pain in right shoulder: Secondary | ICD-10-CM

## 2021-03-06 DIAGNOSIS — M25611 Stiffness of right shoulder, not elsewhere classified: Secondary | ICD-10-CM

## 2021-03-06 DIAGNOSIS — M6281 Muscle weakness (generalized): Secondary | ICD-10-CM

## 2021-03-06 NOTE — Patient Instructions (Signed)
Access Code: [email or web address] URL: [email or web address] Date: 03/06/2021 Prepared by: Vista Mink  Exercises Seated Biceps Curl - 1 x daily - 3 x weekly - 1 sets - 20-30 reps Seated Scapular Retraction - 3-5 x daily - 7 x weekly - 1 sets - 5 reps Supine Scapular Protraction in Flexion with Dumbbells - 1 x daily - 3 x weekly - 1 sets - 20 reps - 3 seconds hold Supine Shoulder Internal Rotation Stretch - 1 x daily - 7 x weekly - 1 sets - 10-20 reps - 10 seconds hold Supine Shoulder External Rotation Stretch - 1 x daily - 7 x weekly - 1 sets - 10-20 reps - 10 seconds hold Seated Shoulder Flexion AAROM with Pulley Behind - 1 x daily - 7 x weekly - 1 sets - 10 reps - 10 seconds hold Standing Shoulder Posterior Capsule Stretch - 1 x daily - 7 x weekly - 1 sets - 10 reps - 10 seconds hold Shoulder External Rotation with Anchored Resistance with Towel Under Elbow - 1 x daily - 3 x weekly - 2-3 sets - 10 reps - 3 hold

## 2021-03-06 NOTE — Therapy (Signed)
[facility] [address] [place], [place], [address] Phone: [phone]   Fax:  [phone]  Physical Therapy Treatment/DC  Patient Details  Name: Daniel Barber MRN: [id number] Date of Birth: August 28, 1950 Referring Provider (PT): Jean Rosenthal MD  PHYSICAL THERAPY DISCHARGE SUMMARY  Visits from Start of Care: 21  Current functional level related to goals / functional outcomes: See note   Remaining deficits: See note   Education / Equipment: Updated HEP   Patient agrees to discharge. Patient goals were met. Patient is being discharged due to being pleased with the current functional level.   Encounter Date: 03/06/2021   PT End of Session - 03/06/21 0937     Visit Number 21    Number of Visits 25    Date for PT Re-Evaluation 03/13/21    Authorization Type KX modifier after visit 15    Progress Note Due on Visit 25    PT Start Time 0930    PT Stop Time 1014    PT Time Calculation (min) 44 min    Activity Tolerance Patient tolerated treatment well;No increased pain    Behavior During Therapy [facility] for tasks assessed/performed             Past Medical History:  Diagnosis Date   Arthritis    COPD, mild (HCC)    External hemorrhoids    symptomatic   GERD (gastroesophageal reflux disease)    History of adenomatous polyp of colon followed by dr Watt Climes   tubular adenoma's 1995;  2000;  2006;  2016   History of malignant melanoma of skin 2017   excision posterior neck (localized)   History of prostate cancer followed by pcp   1999--  radical prostatectomy/  per pt last PSA less than 0.1 on May 2017   Nocturia    Wears glasses     Past Surgical History:  Procedure Laterality Date   COLONOSCOPY  last one 07-27-2014  dr Watt Climes   ELBOW SURGERY Right 1989   HEMORRHOID SURGERY N/A 08/28/2016   Procedure: HEMORRHOIDECTOMY;  Surgeon: Clovis Riley, MD;  Location: [place];  Service: General;  Laterality:  N/A;   KNEE ARTHROSCOPY Left 2002  approx.   MELANOMA EXCISION  2017   posterior neck (localized)   PROSTATECTOMY  1999   dr Risa Grill    There were no vitals filed for this visit.   Subjective Assessment - 03/06/21 1227     Subjective Tom has met goals established at evaluation.  He is happy with his progress and reports readiness to transfer into independent rehabilitation.    Pertinent History Prostate Cancer, GERD, Arthritis, COPD, knee arthroscopy left 2002    Limitations House hold activities;Other (comment)    Diagnostic tests Pre images:   1. Severe tendinosis of the supraspinatus tendon with a large  full-thickness tear measuring 2 cm in anterior-posterior dimension.  2. Moderate tendinosis of the infraspinatus tendon.  3. Moderate tendinosis of the intra-articular portion of the long  head of the biceps tendon.  4. Mild partial-thickness cartilage loss of the posterior glenoid  with subchondral reactive marrow changes.    Patient Stated Goals Get back to fishing, working on old cars, gardening and shooting    Currently in Pain? No/denies    Pain Score 0-No pain    Pain Onset More than a month ago    Pain Frequency Rarely    Aggravating Factors  Overuse    Pain Relieving Factors Ice and 1 Aleve  per day    Effect of Pain on Daily Activities Careful with R UE use    Multiple Pain Sites No                [facility] PT Assessment - 03/06/21 0001       ROM / Strength   AROM / PROM / Strength AROM;Strength      AROM   Overall AROM  Deficits    Overall AROM Comments Measured supine (IR/ER at 70 degrees abduction)    Right/Left Shoulder Right    Right Shoulder Flexion 140 Degrees    Right Shoulder Internal Rotation 55 Degrees    Right Shoulder External Rotation 75 Degrees    Right Shoulder Horizontal  ADduction 25 Degrees      Strength   Overall Strength Deficits    Strength Assessment Site Shoulder    Right/Left Shoulder Right    Right Shoulder Internal Rotation --   34.0  pounds   Right Shoulder External Rotation --   7.4 pounds (was 3.4)                          [facility] Adult PT Treatment/Exercise - 03/06/21 0001       Exercises   Exercises Shoulder      Shoulder Exercises: Supine   Protraction AAROM;Right;20 reps;Limitations    Protraction Weight (lbs) 5    Protraction Limitations 3 seconds, Reach up towards ceiling palm in with L side helping    External Rotation AAROM;Right;10 reps;Limitations    External Rotation Limitations 10 seconds at 70 degrees abduction and 3 pillows under elbow in supine    Internal Rotation AAROM;Right;10 reps;Limitations    Internal Rotation Limitations 10 seconds at 70 degrees abduction elbow on 3 pillows    Flexion --    Flexion Limitations --      Shoulder Exercises: Seated   Retraction Strengthening;Both;10 reps;Limitations    Retraction Limitations 5 seconds      Shoulder Exercises: Standing   External Rotation Strengthening;Right;10 reps;Theraband;Limitations    Theraband Level (Shoulder External Rotation) Level 2 (Red)    External Rotation Limitations slow eccentrics 2 sets    Internal Rotation Strengthening;Right;10 reps;Theraband;Limitations    Theraband Level (Shoulder Internal Rotation) Level 3 (Green)    Internal Rotation Limitations slow eccentrics    Other Standing Exercises Biceps curls with supination 20X 3 seconds 5# slow eccentrics      Shoulder Exercises: Pulleys   Flexion Limitations    Flexion Limitations Palm in, protract 1st in painfree range 10X 5 seconds    Scaption Limitations    Scaption Limitations 10X 10 seconds                     PT Education - 03/06/21 1228     Education Details Reviewed brief exam findings and reviewed/updated long-term DC HEP.    Person(s) Educated Patient    Methods Explanation;Demonstration;Verbal cues;Handout    Comprehension Verbalized understanding;Returned demonstration;Verbal cues required              PT Short Term  Goals - 03/04/21 0951       PT SHORT TERM GOAL #1   Title Pt will be independent in his initial HEP.    Status Achieved      PT SHORT TERM GOAL #2   Title Pt will be able to improve his passive right shoulder flexion to 90 degrees with pain </= 4/10.    Status Achieved  PT Long Term Goals - 03/06/21 1229       PT LONG TERM GOAL #1   Title Pt will improve his FOTO to >/= 63%    Status On-going      PT LONG TERM GOAL #2   Title Pt will  improve his right shoulder flexion to >/= 140 degrees.    Status Achieved      PT LONG TERM GOAL #3   Title Pt will improve right passive ER to >/= 65 degrees.    Status Achieved      PT LONG TERM GOAL #4   Title Pt will be independent in his advanced HEP following protocol progression.    Status Achieved      PT LONG TERM GOAL #5   Title Pt will be able to report improved sleep by 50%.    Status Achieved                   Plan - 03/06/21 1231     Clinical Impression Statement Gershon Mussel has met long-term goals and is independent with his long-term HEP.  He reports (and appears) ready for independent rehabilitation.    Personal Factors and Comorbidities Comorbidity 3+    Comorbidities Prostate CA, COPD, arthritis, GERD, left knee surgery    Examination-Activity Limitations Lift;Reach Overhead    Examination-Participation Restrictions Community Activity    Stability/Clinical Decision Making Stable/Uncomplicated    Rehab Potential Good    PT Frequency Other (comment)    PT Duration Other (comment)   DC   PT Treatment/Interventions ADLs/Self Care Home Management;Cryotherapy;Electrical Stimulation;Functional mobility training;Therapeutic activities;Therapeutic exercise;Neuromuscular re-education;Manual techniques;Passive range of motion;Dry needling;Taping;Vasopneumatic Device;Joint Manipulations;Moist Heat    PT Next Visit Plan DC today    PT Home Exercise Plan [email or web address]    Consulted and Agree with Plan of Care Patient              Patient will benefit from skilled therapeutic intervention in order to improve the following deficits and impairments:  Pain, Postural dysfunction, Decreased strength, Decreased range of motion, Decreased activity tolerance, Impaired UE functional use, Impaired flexibility  Visit Diagnosis: Stiffness of right shoulder, not elsewhere classified  Acute pain of right shoulder  Localized edema  Muscle weakness (generalized)     Problem List Patient Active Problem List   Diagnosis Date Noted   History of prostate cancer 11/11/2020   Right shoulder pain 09/02/2020   History of hyperlipidemia, mixed 12/15/2017   Arthritis 12/14/2016   Gastroesophageal reflux disease without esophagitis 12/14/2016    Farley Ly, PT, MPT 03/06/2021, 12:33 PM  [facility] [address] [place], [place], [address] Phone: [phone]   Fax:  [phone]  Name: MATAI CARPENITO MRN: [id number] Date of Birth: 1950-08-17

## 2021-03-11 ENCOUNTER — Encounter: Payer: Medicare Other | Admitting: Physical Therapy

## 2021-03-13 ENCOUNTER — Encounter: Payer: Medicare Other | Admitting: Rehabilitative and Restorative Service Providers"

## 2021-03-17 DIAGNOSIS — Z20822 Contact with and (suspected) exposure to covid-19: Secondary | ICD-10-CM | POA: Diagnosis not present

## 2021-03-18 ENCOUNTER — Encounter: Payer: Medicare Other | Admitting: Physical Therapy

## 2021-05-12 DIAGNOSIS — Z20822 Contact with and (suspected) exposure to covid-19: Secondary | ICD-10-CM | POA: Diagnosis not present

## 2021-05-26 DIAGNOSIS — Z20822 Contact with and (suspected) exposure to covid-19: Secondary | ICD-10-CM | POA: Diagnosis not present

## 2021-09-17 ENCOUNTER — Ambulatory Visit: Payer: Medicare Other

## 2021-09-17 DIAGNOSIS — H40033 Anatomical narrow angle, bilateral: Secondary | ICD-10-CM | POA: Diagnosis not present

## 2021-09-17 DIAGNOSIS — H2513 Age-related nuclear cataract, bilateral: Secondary | ICD-10-CM | POA: Diagnosis not present

## 2021-09-17 DIAGNOSIS — H21541 Posterior synechiae (iris), right eye: Secondary | ICD-10-CM | POA: Diagnosis not present

## 2021-09-23 ENCOUNTER — Ambulatory Visit: Payer: Medicare Other

## 2021-10-03 ENCOUNTER — Ambulatory Visit (INDEPENDENT_AMBULATORY_CARE_PROVIDER_SITE_OTHER): Payer: Medicare Other

## 2021-10-03 ENCOUNTER — Encounter: Payer: Self-pay | Admitting: Family Medicine

## 2021-10-03 DIAGNOSIS — Z23 Encounter for immunization: Secondary | ICD-10-CM | POA: Diagnosis not present

## 2021-10-03 NOTE — Progress Notes (Signed)
..  After obtaining consent, and per orders of Dr. Gena Fray, injection of Influenza Vaccine given by Augustina Mood. Patient tolerated injection well, and to report any adverse reaction to me immediately.

## 2021-10-03 NOTE — Progress Notes (Deleted)
Per orders of *** pt is here for Influenza vaccine (regular dose) or (high dose). Pt received influenza vaccine  in *** at *** . Pt tolerated influenza vaccine well.

## 2021-10-22 DIAGNOSIS — Z23 Encounter for immunization: Secondary | ICD-10-CM | POA: Diagnosis not present

## 2021-11-06 ENCOUNTER — Telehealth: Payer: Self-pay | Admitting: Family Medicine

## 2021-11-06 NOTE — Telephone Encounter (Signed)
Left message for patient to call back and schedule Medicare Annual Wellness Visit (AWV).   Please offer to do virtually or by telephone.  Left office number and my jabber [phone].  Last AWV:11/12/2020  Please schedule at anytime with Nurse Health Advisor.

## 2021-11-11 ENCOUNTER — Ambulatory Visit: Payer: Medicare Other | Admitting: Family Medicine

## 2021-11-17 ENCOUNTER — Ambulatory Visit (INDEPENDENT_AMBULATORY_CARE_PROVIDER_SITE_OTHER): Payer: Medicare Other | Admitting: Family Medicine

## 2021-11-17 ENCOUNTER — Encounter: Payer: Self-pay | Admitting: Family Medicine

## 2021-11-17 VITALS — BP 132/76 | HR 64 | Temp 98.2°F | Ht 72.0 in | Wt 251.8 lb

## 2021-11-17 DIAGNOSIS — K219 Gastro-esophageal reflux disease without esophagitis: Secondary | ICD-10-CM

## 2021-11-17 DIAGNOSIS — Z9889 Other specified postprocedural states: Secondary | ICD-10-CM

## 2021-11-17 DIAGNOSIS — Z8546 Personal history of malignant neoplasm of prostate: Secondary | ICD-10-CM

## 2021-11-17 LAB — BASIC METABOLIC PANEL
BUN: 23 mg/dL (ref 6–23)
CO2: 24 mEq/L (ref 19–32)
Calcium: 9.2 mg/dL (ref 8.4–10.5)
Chloride: 104 mEq/L (ref 96–112)
Creatinine, Ser: 1.1 mg/dL (ref 0.40–1.50)
GFR: 67.15 mL/min (ref >60.00)
Glucose, Bld: 97 mg/dL (ref 70–99)
Potassium: 4.3 mEq/L (ref 3.5–5.1)
Sodium: 137 mEq/L (ref 135–145)

## 2021-11-17 LAB — PSA: PSA: 0.04 ng/mL — ABNORMAL LOW (ref 0.10–4.00)

## 2021-11-17 NOTE — Progress Notes (Signed)
[facility] [address] [place] [address] Dept: [phone] Dept Fax: [phone]  Chronic Care Office Visit  Subjective:    Patient ID: Daniel Barber, male    DOB: 11/16/1950, 71 y.o..   MRN: [id number]  Chief Complaint  Patient presents with   Follow-up    Yearly f/u.  Fasting today.  No concerns.     History of Present Illness:  Patient is in today for reassessment of chronic medical issues.  Daniel Barber has a history of GERD. He manages this with OTC Prilosec 10 mg. He feels this is doing well overall.   Daniel Barber has a history of bilateral hand arthritis. He uses naproxen 220 mg once daily for management.   Daniel Barber underwent a right rotator cuff repair in Nov. of last year. He has completed a full course of PT. He notes he has some mild limitation to his ROM< but that his pain is much improved over what is was prior to surgery.   Daniel Barber has a prior history of prostate cancer. He underwent prostatectomy in the distant past and has been managing well.  Past Medical History: Patient Active Problem List   Diagnosis Date Noted   History of prostate cancer 11/11/2020   Status post right rotator cuff repair 09/02/2020   History of hyperlipidemia, mixed 12/15/2017   Arthritis 12/14/2016   Gastroesophageal reflux disease without esophagitis 12/14/2016   Past Surgical History:  Procedure Laterality Date   COLONOSCOPY  last one 07-27-2014  dr Watt Climes   ELBOW SURGERY Right 1989   HEMORRHOID SURGERY N/A 08/28/2016   Procedure: HEMORRHOIDECTOMY;  Surgeon: Clovis Riley, MD;  Location: [facility];  Service: General;  Laterality: N/A;   KNEE ARTHROSCOPY Left 2002  approx.   MELANOMA EXCISION  2017   posterior neck (localized)   PROSTATECTOMY  1999   dr Risa Grill   Family History  Problem Relation Age of Onset   Colon cancer Mother    Heart attack Father    Colon cancer Maternal Uncle     Outpatient Medications Prior to Visit  Medication Sig Dispense Refill   Ibuprofen 200 MG CAPS      naproxen sodium (ANAPROX) 220 MG tablet Take 220 mg by mouth 2 (two) times daily with a meal.     omeprazole (PRILOSEC) 10 MG capsule Take 10 mg by mouth as needed.      HYDROcodone-acetaminophen (NORCO) 5-325 MG tablet Take 1 tablet by mouth every 6 (six) hours as needed for moderate pain. 30 tablet 0   oxyCODONE (ROXICODONE) 5 MG immediate release tablet Take 1-2 tablets (5-10 mg total) by mouth every 6 (six) hours as needed for severe pain. 30 tablet 0   predniSONE (DELTASONE) 50 MG tablet Take one tablet daily for 5 days. 5 tablet 0   No facility-administered medications prior to visit.   No Known Allergies    Objective:   Today's Vitals   11/17/21 0955  BP: 132/76  Pulse: 64  Temp: 98.2 F (36.8 C)  TempSrc: Temporal  SpO2: 96%  Weight: 251 lb 12.8 oz (114.2 kg)  Height: 6' (1.829 m)   Body mass index is 34.15 kg/m.   General: Well developed, well nourished. No acute distress. Psych: Alert and oriented. Normal mood and affect.  Health Maintenance Due  Topic Date Due   Medicare Annual Wellness (AWV)  11/12/2021     Assessment & Plan:   1. Gastroesophageal reflux disease without  esophagitis Stable. Continue Prilosec 10 mg daily.  2. Status post right rotator cuff repair Daniel Barber has likely reached a new steady state related to his shoulder issues. He feels he is managing fairly well, despite some residual weakness and inability to reach high above his head.  3. History of prostate cancer We will continue to monitor his PSA and watch for any signs of complications.  - PSA - Basic metabolic panel   Return in about 1 year (around 11/18/2022) for Reassessment.   Haydee Salter, MD

## 2021-11-19 ENCOUNTER — Ambulatory Visit (INDEPENDENT_AMBULATORY_CARE_PROVIDER_SITE_OTHER): Payer: Medicare Other

## 2021-11-19 VITALS — Ht 72.0 in | Wt 250.0 lb

## 2021-11-19 DIAGNOSIS — Z0000 Encounter for general adult medical examination without abnormal findings: Secondary | ICD-10-CM

## 2021-11-19 NOTE — Progress Notes (Signed)
Subjective:   Daniel Barber is a 71 y.o. male who presents for Medicare Annual/Subsequent preventive examination.   I connected with  TAMARI BUSIC on 11/19/21 by a audio enabled telemedicine application and verified that I am speaking with the correct person using two identifiers.  Patient Location: Home  Provider Location: Home Office  I discussed the limitations of evaluation and management by telemedicine. The patient expressed understanding and agreed to proceed.  Review of Systems     Cardiac Risk Factors include: advanced age (>52mn, >>34women);male gender     Objective:    Today's Vitals   11/19/21 0951  Weight: 250 lb (113.4 kg)  Height: 6' (1.829 m)   Body mass index is 33.91 kg/m.     11/19/2021    9:54 AM 12/18/2020   11:18 AM 11/12/2020    9:49 AM 11/07/2019    9:39 AM 12/15/2017    8:48 AM 08/28/2016    6:12 AM  Advanced Directives  Does Patient Have a Medical Advance Directive? Yes Yes Yes Yes No No  Type of AParamedicof [place] will [place] will Healthcare Power of [place]Living will    Does patient want to make changes to medical advance directive?  No - Patient declined      Copy of [place] Chart? No - copy requested  No - copy requested No - copy requested    Would patient like information on creating a medical advance directive?  No - Patient declined No - Patient declined  No - Patient declined Yes (MAU/Ambulatory/Procedural Areas - Information given)    Current Medications (verified) Outpatient Encounter Medications as of 11/19/2021  Medication Sig   Ibuprofen 200 MG CAPS    naproxen sodium (ANAPROX) 220 MG tablet Take 220 mg by mouth 2 (two) times daily with a meal.   omeprazole (PRILOSEC) 10 MG capsule Take 10 mg by mouth as needed.    No facility-administered encounter medications on file as of 11/19/2021.     Allergies (verified) Patient has no known allergies.   History: Past Medical History:  Diagnosis Date   Arthritis    COPD, mild (HCC)    External hemorrhoids    symptomatic   GERD (gastroesophageal reflux disease)    History of adenomatous polyp of colon followed by dr mWatt Climes  tubular adenoma's 1995;  2000;  2006;  2016   History of malignant melanoma of skin 2017   excision posterior neck (localized)   History of prostate cancer followed by pcp   1999--  radical prostatectomy/  per pt last PSA less than 0.1 on May 2017   Nocturia    Wears glasses    Past Surgical History:  Procedure Laterality Date   COLONOSCOPY  last one 07-27-2014  dr mWatt Climes  ELBOW SURGERY Right 1989   HEMORRHOID SURGERY N/A 08/28/2016   Procedure: HEMORRHOIDECTOMY;  Surgeon: CClovis Riley MD;  [facility]  Service: General;  Laterality: N/A;   KNEE ARTHROSCOPY Left 2002  approx.   MELANOMA EXCISION  2017   posterior neck (localized)   PROSTATECTOMY  1999   dr gRisa Grill  Family History  Problem Relation Age of Onset   Colon cancer Mother    Heart attack Father    Colon cancer Maternal Uncle    Social History   Socioeconomic History   Marital status: Married    Spouse name: Not  on file   Number of children: 2   Years of education: 72   Highest education level: Not on file  Occupational History   Occupation: Financial risk analyst   Occupation: Retired  Tobacco Use   Smoking status: Light Smoker    Types: Cigars    Last attempt to quit: 08/18/2006    Years since quitting: 15.2   Smokeless tobacco: Never   Tobacco comments:    smokes an occasional cigar 4-5 timees per year  Vaping Use   Vaping Use: Never used  Substance and Sexual Activity   Alcohol use: Yes    Comment: 2 DRINKS DAILY   Drug use: No   Sexual activity: Yes  Other Topics Concern   Not on file  Social History Narrative   Not on file   Social Determinants of Health   Financial Resource  Strain: Low Risk  (11/19/2021)   Overall Financial Resource Strain (CARDIA)    Difficulty of Paying Living Expenses: Not hard at all  Food Insecurity: No Food Insecurity (11/19/2021)   Hunger Vital Sign    Worried About Running Out of Food in the Last Year: Never true    Ran Out of Food in the Last Year: Never true  Transportation Needs: No Transportation Needs (11/19/2021)   PRAPARE - Hydrologist (Medical): No    Lack of Transportation (Non-Medical): No  Physical Activity: Insufficiently Active (11/19/2021)   Exercise Vital Sign    Days of Exercise per Week: 3 days    Minutes of Exercise per Session: 30 min  Stress: No Stress Concern Present (11/19/2021)   [place]    Feeling of Stress : Not at all  Social Connections: [place] (11/19/2021)   Social Connection and Isolation Panel [NHANES]    Frequency of Communication with Friends and Family: More than three times a week    Frequency of Social Gatherings with Friends and Family: More than three times a week    Attends Religious Services: 1 to 4 times per year    Active Member of [employer] or Organizations: Yes    Attends Music therapist: More than 4 times per year    Marital Status: Married    Tobacco Counseling Ready to quit: Not Answered Counseling given: Not Answered Tobacco comments: smokes an occasional cigar 4-5 timees per year   Clinical Intake:  Pre-visit preparation completed: Yes  Pain : No/denies pain     Nutritional Risks: None Diabetes: No  How often do you need to have someone help you when you read instructions, pamphlets, or other written materials from your doctor or pharmacy?: 1 - Never  Diabetic?no    Interpreter Needed?: No  Information entered by :: Jadene Pierini, LPN   Activities of Daily Living    11/19/2021    9:54 AM 11/15/2021    8:43 AM  In your present state of health,  do you have any difficulty performing the following activities:  Hearing? 0 0  Vision? 0 0  Difficulty concentrating or making decisions? 0 0  Walking or climbing stairs? 0 1  Dressing or bathing? 0 0  Doing errands, shopping? 0 0  Preparing Food and eating ? N N  Using the Toilet? N N  In the past six months, have you accidently leaked urine? N N  Do you have problems with loss of bowel control? N N  Managing your Medications? N N  Managing your  Finances? N N  Housekeeping or managing your Housekeeping? N N    Patient Care Team: Haydee Salter, MD as PCP - General (Family Medicine) Danella Sensing, MD as Consulting Physician (Dermatology) Warden Fillers, MD as Consulting Physician (Ophthalmology) Mcarthur Rossetti, MD as Consulting Physician (Orthopedic Surgery)  Indicate any recent Medical Services you may have received from other than [facility] providers in the past year (date may be approximate).     Assessment:   This is a routine wellness examination for [place].  Hearing/Vision screen Vision Screening - Comments:: Annual eye exams wears glasses   Dietary issues and exercise activities discussed: Current Exercise Habits: Home exercise routine, Type of exercise: walking, Time (Minutes): 30, Frequency (Times/Week): 3, Weekly Exercise (Minutes/Week): 90, Exercise limited by: None identified   Goals Addressed             This Visit's Progress    Patient Stated   On track    I want to get back into the Silver Sneakers program and start to exercise in the [facility]       Depression Screen    11/19/2021    9:53 AM 11/17/2021    9:56 AM 11/12/2020    9:58 AM 11/11/2020    2:55 PM 11/07/2019    9:41 AM 12/15/2017    8:26 AM 06/11/2015    4:17 PM  PHQ 2/9 Scores  PHQ - 2 Score 0 0 0 0 0 0 0    Fall Risk    11/19/2021    9:52 AM 11/17/2021    9:56 AM 11/15/2021    8:43 AM 11/11/2020    2:55 PM 11/07/2019    9:40 AM  Fall Risk   Falls in the past year? 0 1  0 0 0  Number falls in past yr: 0 0 0 0 0  Injury with Fall? 0 0 0 0 0  Risk for fall due to : No Fall Risks No Fall Risks  No Fall Risks   Follow up Falls prevention discussed Falls evaluation completed  Falls evaluation completed Falls prevention discussed    FALL RISK PREVENTION PERTAINING TO THE HOME:  Any stairs in or around the home? No  If so, are there any without handrails? No  Home free of loose throw rugs in walkways, pet beds, electrical cords, etc? Yes  Adequate lighting in your home to reduce risk of falls? Yes   ASSISTIVE DEVICES UTILIZED TO PREVENT FALLS:  Life alert? No  Use of a cane, walker or w/c? No  Grab bars in the bathroom? Yes  Shower chair or bench in shower? Yes  Elevated toilet seat or a handicapped toilet? Yes          11/19/2021    9:54 AM 11/07/2019    9:46 AM  6CIT Screen  What Year? 0 points 0 points  What month? 0 points 0 points  What time? 0 points 0 points  Count back from 20 0 points 0 points  Months in reverse 0 points 0 points  Repeat phrase 0 points 0 points  Total Score 0 points 0 points    Immunizations Immunization History  Administered Date(s) Administered   Fluad Quad(high Dose 65+) 10/19/2018, 11/07/2019, 10/08/2020, 10/03/2021   Influenza, High Dose Seasonal PF 12/05/2014, 11/25/2015, 10/30/2016, 12/15/2017   Moderna Covid-19 Vaccine Bivalent Booster 5yr & up 12/23/2020, 10/22/2021   Moderna SARS-COV2 Booster Vaccination 11/15/2019, 07/11/2020   Moderna Sars-Covid-2 Vaccination 02/24/2019, 03/29/2019   Pneumococcal Conjugate-13 12/19/2014   Pneumococcal  Polysaccharide-23 06/18/2016   Td 05/30/2014   Zoster Recombinat (Shingrix) 12/08/2019, 02/19/2020   Zoster, Live 05/30/2014    TDAP status: Up to date  Flu Vaccine status: Up to date  Pneumococcal vaccine status: Up to date  Covid-19 vaccine status: Completed vaccines  Qualifies for Shingles Vaccine? Yes   Zostavax completed Yes   Shingrix Completed?:  Yes  Screening Tests Health Maintenance  Topic Date Due   Medicare Annual Wellness (AWV)  11/20/2022   TETANUS/TDAP  05/29/2024   COLONOSCOPY (Pts 45-36yr Insurance coverage will need to be confirmed)  08/05/2024   Pneumonia Vaccine 72 Years old  Completed   INFLUENZA VACCINE  Completed   COVID-19 Vaccine  Completed   Hepatitis C Screening  Completed   Zoster Vaccines- Shingrix  Completed   HPV VACCINES  Aged Out    Health Maintenance  There are no preventive care reminders to display for this patient.   Colorectal cancer screening: Type of screening: Colonoscopy. Completed 08/06/2014. Repeat every 10 years  Lung Cancer Screening: (Low Dose CT Chest recommended if Age 72-80years, 30 pack-year currently smoking OR have quit w/in 15years.) does not qualify.   Lung Cancer Screening Referral: n/a  Additional Screening:  Hepatitis C Screening: does not qualify;  Vision Screening: Recommended annual ophthalmology exams for early detection of glaucoma and other disorders of the eye. Is the patient up to date with their annual eye exam?  Yes  Who is the provider or what is the name of the office in which the patient attends annual eye exams? Dr.Groat  If pt is not established with a provider, would they like to be referred to a provider to establish care? No .   Dental Screening: Recommended annual dental exams for proper oral hygiene  Community Resource Referral / Chronic Care Management: CRR required this visit?  No   CCM required this visit?  No      Plan:     I have personally reviewed and noted the following in the patient's chart:   Medical and social history Use of alcohol, tobacco or illicit drugs  Current medications and supplements including opioid prescriptions. Patient is not currently taking opioid prescriptions. Functional ability and status Nutritional status Physical activity Advanced directives List of other physicians Hospitalizations, surgeries,  and ER visits in previous 12 months Vitals Screenings to include cognitive, depression, and falls Referrals and appointments  In addition, I have reviewed and discussed with patient certain preventive protocols, quality metrics, and best practice recommendations. A written personalized care plan for preventive services as well as general preventive health recommendations were provided to patient.     LDaphane Shepherd LPN   [id number]  Nurse Notes: none

## 2021-11-19 NOTE — Patient Instructions (Signed)
Mr. Pelaez , Thank you for taking time to come for your Medicare Wellness Visit. I appreciate your ongoing commitment to your health goals. Please review the following plan we discussed and let me know if I can assist you in the future.   These are the goals we discussed:  Goals      Patient Stated     I want to get back into the Silver Sneakers program and start to exercise in the [facility].     Patient Stated     Wants to start exercising again          This is a list of the screening recommended for you and due dates:  Health Maintenance  Topic Date Due   Medicare Annual Wellness Visit  11/20/2022   Tetanus Vaccine  05/29/2024   Colon Cancer Screening  08/05/2024   Pneumonia Vaccine  Completed   Flu Shot  Completed   COVID-19 Vaccine  Completed   Hepatitis C Screening: USPSTF Recommendation to screen - Ages 18-79 yo.  Completed   Zoster (Shingles) Vaccine  Completed   HPV Vaccine  Aged Out    Advanced directives: Please bring a copy of your health care power of attorney and living will to the office to be added to your chart at your convenience.   Conditions/risks identified: Aim for 30 minutes of exercise or brisk walking, 6-8 glasses of water, and 5 servings of fruits and vegetables each day.   Next appointment: Follow up in one year for your annual wellness visit.   Preventive Care 47 Years and Older, Male  Preventive care refers to lifestyle choices and visits with your health care provider that can promote health and wellness. What does preventive care include? A yearly physical exam. This is also called an annual well check. Dental exams once or twice a year. Routine eye exams. Ask your health care provider how often you should have your eyes checked. Personal lifestyle choices, including: Daily care of your teeth and gums. Regular physical activity. Eating a healthy diet. Avoiding tobacco and drug use. Limiting alcohol use. Practicing safe sex. Taking low doses  of aspirin every day. Taking vitamin and mineral supplements as recommended by your health care provider. What happens during an annual well check? The services and screenings done by your health care provider during your annual well check will depend on your age, overall health, lifestyle risk factors, and family history of disease. Counseling  Your health care provider may ask you questions about your: Alcohol use. Tobacco use. Drug use. Emotional well-being. Home and relationship well-being. Sexual activity. Eating habits. History of falls. Memory and ability to understand (cognition). Work and work Statistician. Screening  You may have the following tests or measurements: Height, weight, and BMI. Blood pressure. Lipid and cholesterol levels. These may be checked every 5 years, or more frequently if you are over 19 years old. Skin check. Lung cancer screening. You may have this screening every year starting at age 29 if you have a 30-pack-year history of smoking and currently smoke or have quit within the past 15 years. Fecal occult blood test (FOBT) of the stool. You may have this test every year starting at age 65. Flexible sigmoidoscopy or colonoscopy. You may have a sigmoidoscopy every 5 years or a colonoscopy every 10 years starting at age 38. Prostate cancer screening. Recommendations will vary depending on your family history and other risks. Hepatitis C blood test. Hepatitis B blood test. Sexually transmitted disease (STD) testing.  Diabetes screening. This is done by checking your blood sugar (glucose) after you have not eaten for a while (fasting). You may have this done every 1-3 years. Abdominal aortic aneurysm (AAA) screening. You may need this if you are a current or former smoker. Osteoporosis. You may be screened starting at age 67 if you are at high risk. Talk with your health care provider about your test results, treatment options, and if necessary, the need for  more tests. Vaccines  Your health care provider may recommend certain vaccines, such as: Influenza vaccine. This is recommended every year. Tetanus, diphtheria, and acellular pertussis (Tdap, Td) vaccine. You may need a Td booster every 10 years. Zoster vaccine. You may need this after age 63. Pneumococcal 13-valent conjugate (PCV13) vaccine. One dose is recommended after age 63. Pneumococcal polysaccharide (PPSV23) vaccine. One dose is recommended after age 29. Talk to your health care provider about which screenings and vaccines you need and how often you need them. This information is not intended to replace advice given to you by your health care provider. Make sure you discuss any questions you have with your health care provider. Document Released: 02/01/2015 Document Revised: 09/25/2015 Document Reviewed: 11/06/2014 Elsevier Interactive Patient Education  2017 [place] Prevention in the Home Falls can cause injuries. They can happen to people of all ages. There are many things you can do to make your home safe and to help prevent falls. What can I do on the outside of my home? Regularly fix the edges of walkways and driveways and fix any cracks. Remove anything that might make you trip as you walk through a door, such as a raised step or threshold. Trim any bushes or trees on the path to your home. Use bright outdoor lighting. Clear any walking paths of anything that might make someone trip, such as rocks or tools. Regularly check to see if handrails are loose or broken. Make sure that both sides of any steps have handrails. Any raised decks and porches should have guardrails on the edges. Have any leaves, snow, or ice cleared regularly. Use sand or salt on walking paths during winter. Clean up any spills in your garage right away. This includes oil or grease spills. What can I do in the bathroom? Use night lights. Install grab bars by the toilet and in the tub and  shower. Do not use towel bars as grab bars. Use non-skid mats or decals in the tub or shower. If you need to sit down in the shower, use a plastic, non-slip stool. Keep the floor dry. Clean up any water that spills on the floor as soon as it happens. Remove soap buildup in the tub or shower regularly. Attach bath mats securely with double-sided non-slip rug tape. Do not have throw rugs and other things on the floor that can make you trip. What can I do in the bedroom? Use night lights. Make sure that you have a light by your bed that is easy to reach. Do not use any sheets or blankets that are too big for your bed. They should not hang down onto the floor. Have a firm chair that has side arms. You can use this for support while you get dressed. Do not have throw rugs and other things on the floor that can make you trip. What can I do in the kitchen? Clean up any spills right away. Avoid walking on wet floors. Keep items that you use a lot in easy-to-reach  places. If you need to reach something above you, use a strong step stool that has a grab bar. Keep electrical cords out of the way. Do not use floor polish or wax that makes floors slippery. If you must use wax, use non-skid floor wax. Do not have throw rugs and other things on the floor that can make you trip. What can I do with my stairs? Do not leave any items on the stairs. Make sure that there are handrails on both sides of the stairs and use them. Fix handrails that are broken or loose. Make sure that handrails are as long as the stairways. Check any carpeting to make sure that it is firmly attached to the stairs. Fix any carpet that is loose or worn. Avoid having throw rugs at the top or bottom of the stairs. If you do have throw rugs, attach them to the floor with carpet tape. Make sure that you have a light switch at the top of the stairs and the bottom of the stairs. If you do not have them, ask someone to add them for  you. What else can I do to help prevent falls? Wear shoes that: Do not have high heels. Have rubber bottoms. Are comfortable and fit you well. Are closed at the toe. Do not wear sandals. If you use a stepladder: Make sure that it is fully opened. Do not climb a closed stepladder. Make sure that both sides of the stepladder are locked into place. Ask someone to hold it for you, if possible. Clearly mark and make sure that you can see: Any grab bars or handrails. First and last steps. Where the edge of each step is. Use tools that help you move around (mobility aids) if they are needed. These include: Canes. Walkers. Scooters. Crutches. Turn on the lights when you go into a dark area. Replace any light bulbs as soon as they burn out. Set up your furniture so you have a clear path. Avoid moving your furniture around. If any of your floors are uneven, fix them. If there are any pets around you, be aware of where they are. Review your medicines with your doctor. Some medicines can make you feel dizzy. This can increase your chance of falling. Ask your doctor what other things that you can do to help prevent falls. This information is not intended to replace advice given to you by your health care provider. Make sure you discuss any questions you have with your health care provider. Document Released: 11/01/2008 Document Revised: 06/13/2015 Document Reviewed: 02/09/2014 Elsevier Interactive Patient Education  2017 [employer].

## 2022-02-23 DIAGNOSIS — D225 Melanocytic nevi of trunk: Secondary | ICD-10-CM | POA: Diagnosis not present

## 2022-02-23 DIAGNOSIS — D485 Neoplasm of uncertain behavior of skin: Secondary | ICD-10-CM | POA: Diagnosis not present

## 2022-02-23 DIAGNOSIS — L821 Other seborrheic keratosis: Secondary | ICD-10-CM | POA: Diagnosis not present

## 2022-02-23 DIAGNOSIS — L814 Other melanin hyperpigmentation: Secondary | ICD-10-CM | POA: Diagnosis not present

## 2022-02-23 DIAGNOSIS — Z85820 Personal history of malignant melanoma of skin: Secondary | ICD-10-CM | POA: Diagnosis not present

## 2022-02-23 DIAGNOSIS — L570 Actinic keratosis: Secondary | ICD-10-CM | POA: Diagnosis not present

## 2022-02-23 DIAGNOSIS — C44311 Basal cell carcinoma of skin of nose: Secondary | ICD-10-CM | POA: Diagnosis not present

## 2022-03-20 HISTORY — PX: SKIN CANCER EXCISION: SHX779

## 2022-04-06 DIAGNOSIS — Z85828 Personal history of other malignant neoplasm of skin: Secondary | ICD-10-CM | POA: Diagnosis not present

## 2022-04-06 DIAGNOSIS — Z85820 Personal history of malignant melanoma of skin: Secondary | ICD-10-CM | POA: Diagnosis not present

## 2022-04-06 DIAGNOSIS — C44311 Basal cell carcinoma of skin of nose: Secondary | ICD-10-CM | POA: Diagnosis not present

## 2022-05-07 IMAGING — MR MR SHOULDER*R* W/O CM
4 of 5 series · 21 of 40 positions shown · non-contrast
Comparison: None.

CLINICAL DATA: Shoulder pain.  Injured 10 weeks ago.

EXAM:
MRI OF THE RIGHT SHOULDER WITHOUT CONTRAST
TECHNIQUE: Multiplanar, multisequence MR imaging of the shoulder was performed.
No intravenous contrast was administered.

[Series 6: T2 fat-sat · axial · right · 3.0mm · 0.47mm/px · z∈[-39,+54]mm · 8 of 27 slices shown (1 of 3)]
[im 1/27]
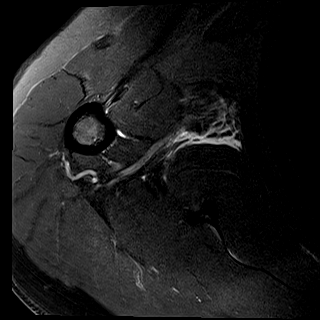
[im 3/27]
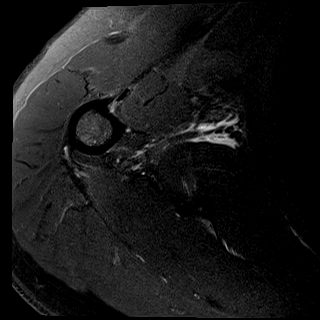
[im 9/27]
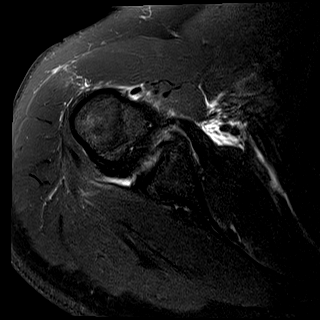
[im 12/27]
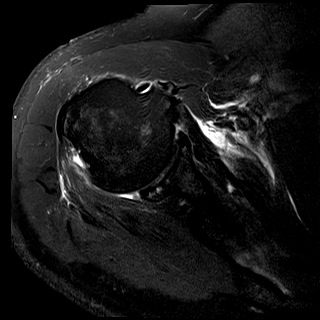
[im 15/27]
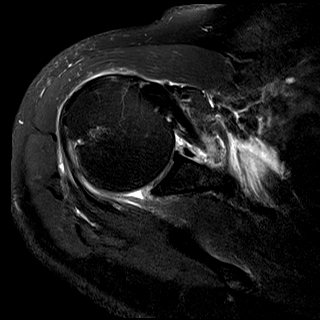
[im 18/27]
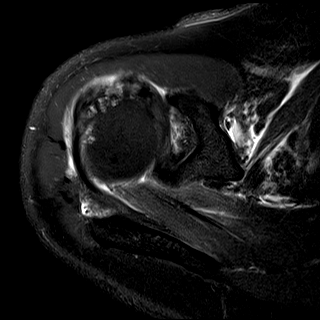
[im 24/27]
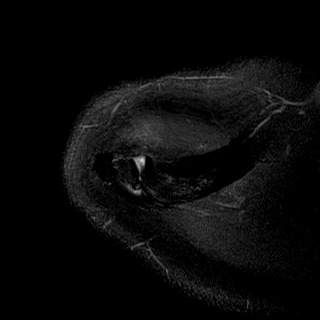
[im 27/27]
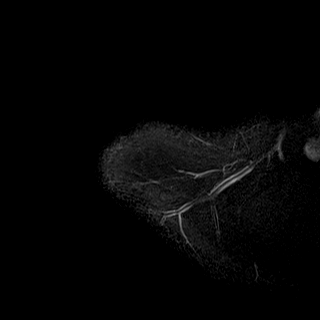

[Series 7: PD · oblique · right · 4.0mm · 0.23mm/px · 7 of 21 slices shown]
[im 1/21]
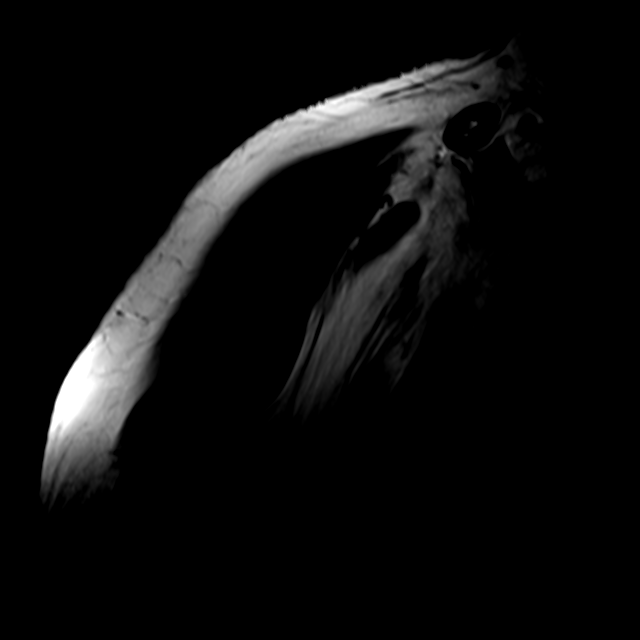
[im 4/21]
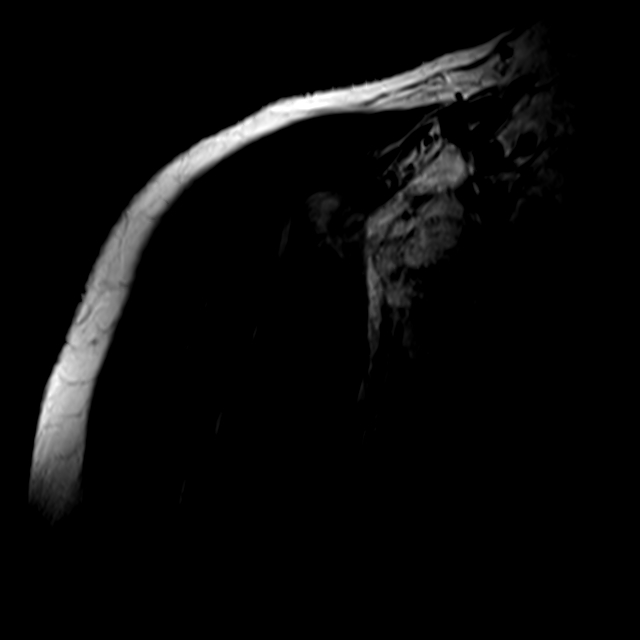
[im 7/21]
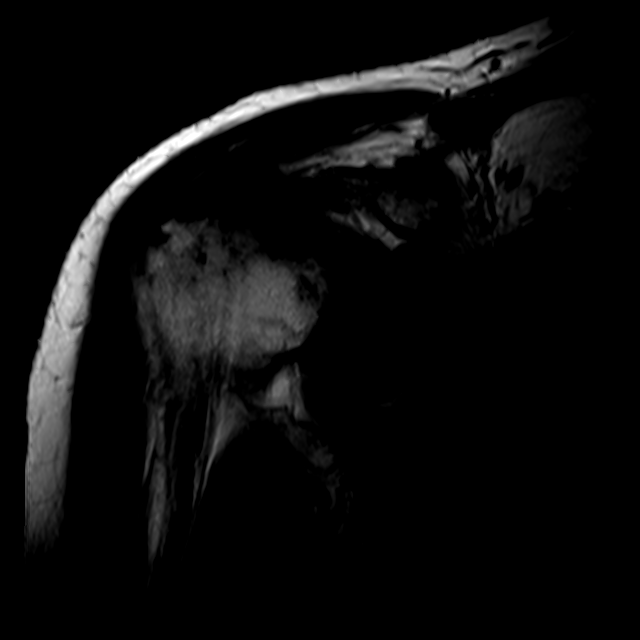
[im 11/21]
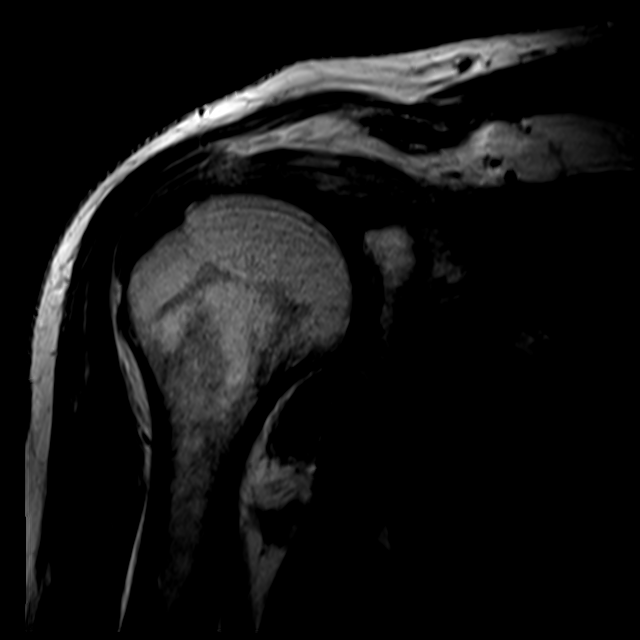
[im 14/21]
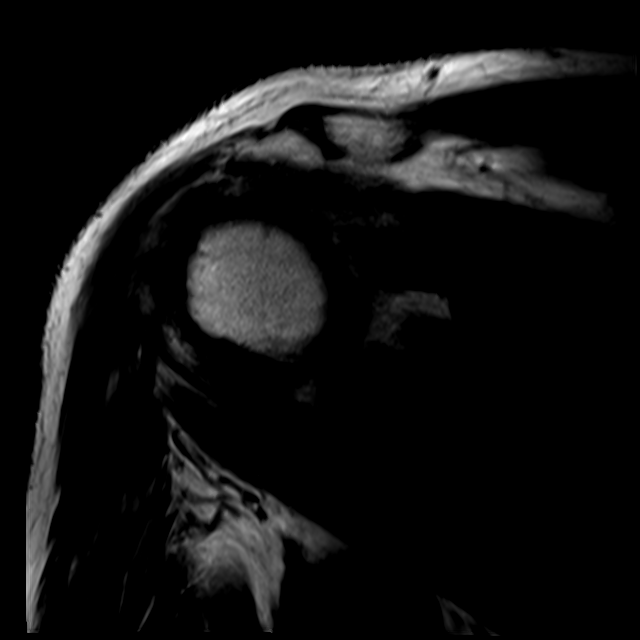
[im 17/21]
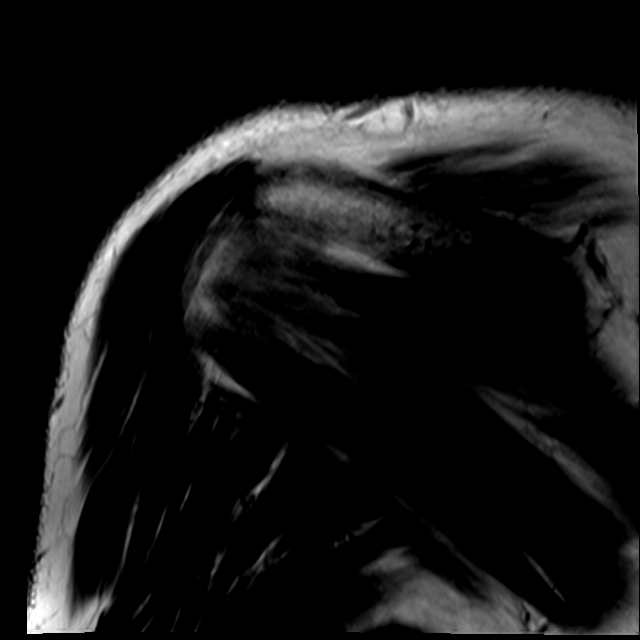
[im 21/21]
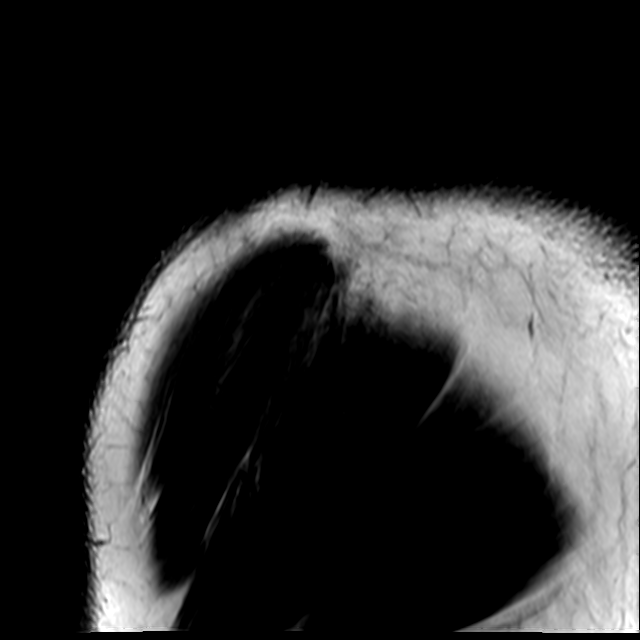

[Series 8: T2 fat-sat · oblique · right · 4.0mm · 0.23mm/px · 3 of 21 slices shown (2 of 3)]
[im 4/21]
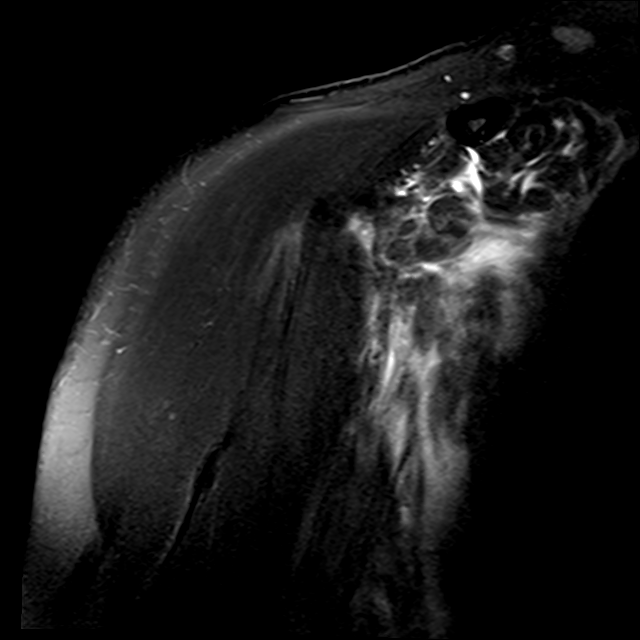
[im 11/21]
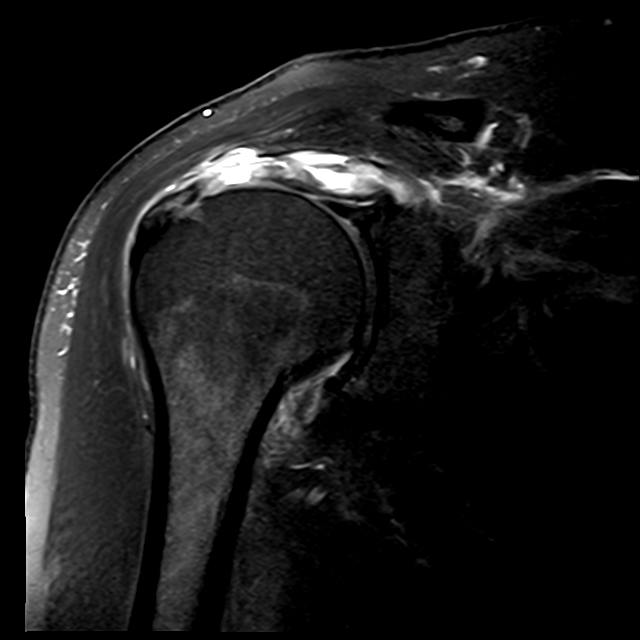
[im 17/21]
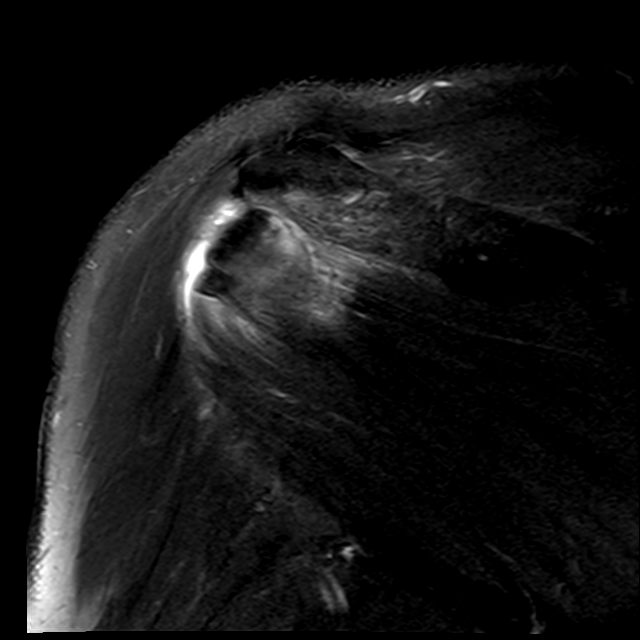

[Series 9: T2 fat-sat · sagittal · right · 4.0mm · 0.44mm/px · 3 of 23 slices shown (3 of 3)]
[im 4/23]
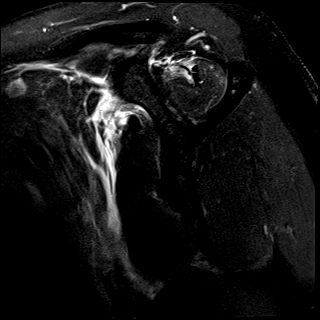
[im 13/23]
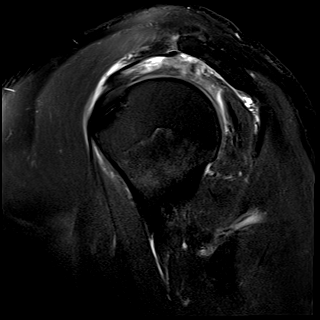
[im 19/23]
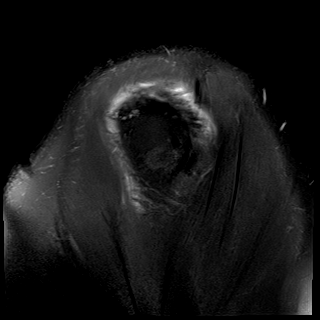

[21 of 40 positions shown; findings below may reference images not displayed]

FINDINGS: Rotator cuff: Severe tendinosis of the supraspinatus tendon with a
large full-thickness tear measuring 2 cm in anterior-posterior
dimension. Moderate tendinosis of the infraspinatus tendon. Teres
minor tendon is intact. Subscapularis tendon is intact.

Muscles: No muscle atrophy or edema. No intramuscular fluid
collection or hematoma.

Biceps Long Head: Moderate tendinosis of the intra-articular portion
of the long head of the biceps tendon.

Acromioclavicular Joint: Mild arthropathy of the acromioclavicular
joint. Trace subacromial/subdeltoid bursal fluid.

Glenohumeral Joint: No joint effusion. Mild partial-thickness
cartilage loss of the posterior glenoid with subchondral reactive
marrow changes. Mild thickening of the anterior inferior joint
capsule likely reflecting capsular strain without a complete tear.

Labrum: Grossly intact, but evaluation is limited by lack of
intraarticular fluid/contrast.

Bones: No fracture or dislocation. No aggressive osseous lesion.

Other: No fluid collection or hematoma.
IMPRESSION: 1. Severe tendinosis of the supraspinatus tendon with a large
full-thickness tear measuring 2 cm in anterior-posterior dimension.
2. Moderate tendinosis of the infraspinatus tendon.
3. Moderate tendinosis of the intra-articular portion of the long
head of the biceps tendon.
4. Mild partial-thickness cartilage loss of the posterior glenoid
with subchondral reactive marrow changes.

## 2022-09-22 DIAGNOSIS — H21541 Posterior synechiae (iris), right eye: Secondary | ICD-10-CM | POA: Diagnosis not present

## 2022-09-22 DIAGNOSIS — H2513 Age-related nuclear cataract, bilateral: Secondary | ICD-10-CM | POA: Diagnosis not present

## 2022-09-22 DIAGNOSIS — H40033 Anatomical narrow angle, bilateral: Secondary | ICD-10-CM | POA: Diagnosis not present

## 2022-09-23 DIAGNOSIS — Z85820 Personal history of malignant melanoma of skin: Secondary | ICD-10-CM | POA: Diagnosis not present

## 2022-09-23 DIAGNOSIS — L570 Actinic keratosis: Secondary | ICD-10-CM | POA: Diagnosis not present

## 2022-09-23 DIAGNOSIS — Z85828 Personal history of other malignant neoplasm of skin: Secondary | ICD-10-CM | POA: Diagnosis not present

## 2022-09-23 DIAGNOSIS — D485 Neoplasm of uncertain behavior of skin: Secondary | ICD-10-CM | POA: Diagnosis not present

## 2022-11-18 ENCOUNTER — Ambulatory Visit (INDEPENDENT_AMBULATORY_CARE_PROVIDER_SITE_OTHER): Payer: Medicare Other | Admitting: Family Medicine

## 2022-11-18 ENCOUNTER — Encounter: Payer: Self-pay | Admitting: Family Medicine

## 2022-11-18 VITALS — BP 128/70 | HR 68 | Temp 98.0°F | Ht 72.0 in | Wt 257.0 lb

## 2022-11-18 DIAGNOSIS — Z8546 Personal history of malignant neoplasm of prostate: Secondary | ICD-10-CM | POA: Diagnosis not present

## 2022-11-18 DIAGNOSIS — R053 Chronic cough: Secondary | ICD-10-CM | POA: Diagnosis not present

## 2022-11-18 DIAGNOSIS — Z23 Encounter for immunization: Secondary | ICD-10-CM | POA: Diagnosis not present

## 2022-11-18 DIAGNOSIS — Z13220 Encounter for screening for lipoid disorders: Secondary | ICD-10-CM | POA: Diagnosis not present

## 2022-11-18 DIAGNOSIS — U099 Post covid-19 condition, unspecified: Secondary | ICD-10-CM

## 2022-11-18 DIAGNOSIS — K219 Gastro-esophageal reflux disease without esophagitis: Secondary | ICD-10-CM

## 2022-11-18 DIAGNOSIS — K644 Residual hemorrhoidal skin tags: Secondary | ICD-10-CM | POA: Insufficient documentation

## 2022-11-18 DIAGNOSIS — Z860100 Personal history of colon polyps, unspecified: Secondary | ICD-10-CM | POA: Insufficient documentation

## 2022-11-18 LAB — LIPID PANEL
Cholesterol: 193 mg/dL (ref 0–200)
HDL: 42.9 mg/dL (ref >39.00)
LDL Cholesterol: 125 mg/dL — ABNORMAL HIGH (ref 0–99)
NonHDL: 150.39
Total CHOL/HDL Ratio: 5
Triglycerides: 127 mg/dL (ref 0.0–149.0)
VLDL: 25.4 mg/dL (ref 0.0–40.0)

## 2022-11-18 LAB — PSA: PSA: 0.05 ng/mL — ABNORMAL LOW (ref 0.10–4.00)

## 2022-11-18 NOTE — Progress Notes (Signed)
[facility] [address] [place] [address] Dept: [phone] Dept Fax: [phone]  Chronic Care Office Visit  Subjective:    Patient ID: Daniel Barber, male    DOB: [birth year], 72 y.o..   MRN: [id number]  Chief Complaint  Patient presents with   Follow-up    1 year f/u.  C/o having a lingering cough and low energy since having covid 10/22/22. Wants flu shot today.    History of Present Illness:  Patient is in today for reassessment of chronic medical issues.  Mr. Lord has a history of GERD. He manages this with OTC Prilosec 10 mg. He feels this is doing well overall.   Mr. Maguire has a prior history of prostate cancer. He underwent prostatectomy in the distant past and has been managing well.  Mr. Leventry notes that he contracted COVID in early October. This was the first COVID infection he knows that he has had. He has recovered from most of his symptoms, except he has some lingering cough with mucous production and ongoing fatigue.  Past Medical History: Patient Active Problem List   Diagnosis Date Noted   History of colonic polyps 11/18/2022   External hemorrhoids 11/18/2022   History of prostate cancer 11/11/2020   Status post right rotator cuff repair 09/02/2020   History of hyperlipidemia, mixed 12/15/2017   Arthritis 12/14/2016   Gastroesophageal reflux disease without esophagitis 12/14/2016   Past Surgical History:  Procedure Laterality Date   COLONOSCOPY  last one 07-27-2014  dr Ewing Schlein   ELBOW SURGERY Right 1989   HEMORRHOID SURGERY N/A 08/28/2016   Procedure: HEMORRHOIDECTOMY;  Surgeon: Berna Bue, MD;  [facility];  Service: General;  Laterality: N/A;   KNEE ARTHROSCOPY Left 2002  approx.   MELANOMA EXCISION  2017   posterior neck (localized)   PROSTATECTOMY  1999   dr Isabel Caprice   SKIN CANCER EXCISION  03/2022   nose   Family History  Problem Relation Age of  Onset   Colon cancer Mother    Heart attack Father    Colon cancer Maternal Uncle    Outpatient Medications Prior to Visit  Medication Sig Dispense Refill   Ibuprofen 200 MG CAPS      naproxen sodium (ANAPROX) 220 MG tablet Take 220 mg by mouth 2 (two) times daily with a meal.     omeprazole (PRILOSEC) 10 MG capsule Take 10 mg by mouth as needed.      No facility-administered medications prior to visit.   No Known Allergies Objective:   Today's Vitals   11/18/22 0944  BP: 128/70  Pulse: 68  Temp: 98 F (36.7 C)  TempSrc: Temporal  SpO2: 95%  Weight: 257 lb (116.6 kg)  Height: 6' (1.829 m)   Body mass index is 34.86 kg/m.   General: Well developed, well nourished. No acute distress. HEENT: Normocephalic, non-traumatic. External ears normal. EAC and TMs normal bilaterally. PERRL, EOMI.   Conjunctiva clear. Nose clear without congestion or rhinorrhea. Mucous membranes moist. Oropharynx clear. Good   dentition. Neck: Supple. No lymphadenopathy. No thyromegaly. Lungs: Clear to auscultation bilaterally. No wheezing, rales or rhonchi. CV: RRR without murmurs or rubs. Pulses 2+ bilaterally. Psych: Alert and oriented. Normal mood and affect.  Health Maintenance Due  Topic Date Due   INFLUENZA VACCINE  08/20/2022   Medicare Annual Wellness (AWV)  11/20/2022     Assessment & Plan:   Problem List Items Addressed This Visit  Digestive   Gastroesophageal reflux disease without esophagitis    Stable. Continue omeprazole 10 mg daily.        Other   History of prostate cancer - Primary    I will do his annual PSA today for surveillance.      Relevant Orders   PSA   Other Visit Diagnoses     COVID-19 long hauler manifesting chronic cough       Discussed the nature of long COVID symptoms. This shodul resolve with time. Recommend hot tea with honey to help with the cough.   Screening for lipid disorders       Relevant Orders   Lipid panel   Need for immunization  against influenza       Relevant Orders   Flu Vaccine Trivalent High Dose (Fluad) (Completed)       Return in about 1 year (around 11/18/2023) for Annual preventative care.   Loyola Mast, MD

## 2022-11-18 NOTE — Assessment & Plan Note (Signed)
Stable. Continue omeprazole 10mg  daily

## 2022-11-18 NOTE — Assessment & Plan Note (Signed)
I will do his annual PSA today for surveillance.

## 2022-11-24 ENCOUNTER — Encounter: Payer: Self-pay | Admitting: Family Medicine

## 2022-12-14 ENCOUNTER — Encounter: Payer: Self-pay | Admitting: Family Medicine

## 2022-12-14 DIAGNOSIS — Z23 Encounter for immunization: Secondary | ICD-10-CM | POA: Diagnosis not present

## 2023-01-11 ENCOUNTER — Ambulatory Visit (INDEPENDENT_AMBULATORY_CARE_PROVIDER_SITE_OTHER): Payer: Medicare Other

## 2023-01-11 DIAGNOSIS — Z0000 Encounter for general adult medical examination without abnormal findings: Secondary | ICD-10-CM | POA: Diagnosis not present

## 2023-01-11 NOTE — Progress Notes (Signed)
Subjective:   Daniel Barber is a 72 y.o. male who presents for Medicare Annual/Subsequent preventive examination.  Visit Complete: Virtual I connected with  Daniel Barber on 01/11/23 by a audio enabled telemedicine application and verified that I am speaking with the correct person using two identifiers.  Patient Location: Home  Provider Location: Office/Clinic  I discussed the limitations of evaluation and management by telemedicine. The patient expressed understanding and agreed to proceed.  Vital Signs: Because this visit was a virtual/telehealth visit, some criteria may be missing or patient reported. Any vitals not documented were not able to be obtained and vitals that have been documented are patient reported.  Patient Medicare AWV questionnaire was completed by the patient on 01/07/2023; I have confirmed that all information answered by patient is correct and no changes since this date.  Cardiac Risk Factors include: advanced age (>58men, >34 women);male gender     Objective:    Today's Vitals   01/11/23 0841  PainSc: 1    There is no height or weight on file to calculate BMI.     01/11/2023    8:47 AM 11/19/2021    9:54 AM 12/18/2020   11:18 AM 11/12/2020    9:49 AM 11/07/2019    9:39 AM 12/15/2017    8:48 AM 08/28/2016    6:12 AM  Advanced Directives  Does Patient Have a Medical Advance Directive? Yes Yes Yes Yes Yes No No  Type of Estate agent of [place];Living will Healthcare Power of [place];Living will Healthcare Power of [place];Living will Healthcare Power of [employer] of [place];Living will    Does patient want to make changes to medical advance directive?   No - Patient declined      Copy of Healthcare Power of Attorney in Chart? No - copy requested No - copy requested  No - copy requested No - copy requested    Would patient like information on creating a medical advance directive?   No - Patient declined  No - Patient declined  No - Patient declined Yes (MAU/Ambulatory/Procedural Areas - Information given)    Current Medications (verified) Outpatient Encounter Medications as of 01/11/2023  Medication Sig   Ibuprofen 200 MG CAPS    naproxen sodium (ANAPROX) 220 MG tablet Take 220 mg by mouth 2 (two) times daily with a meal.   omeprazole (PRILOSEC) 10 MG capsule Take 10 mg by mouth as needed.    No facility-administered encounter medications on file as of 01/11/2023.    Allergies (verified) Patient has no known allergies.   History: Past Medical History:  Diagnosis Date   Arthritis    COPD, mild (HCC)    External hemorrhoids    symptomatic   GERD (gastroesophageal reflux disease)    History of adenomatous polyp of colon followed by dr Ewing Schlein   tubular adenoma's 1995;  2000;  2006;  2016   History of malignant melanoma of skin 2017   excision posterior neck (localized)   History of prostate cancer followed by pcp   1999--  radical prostatectomy/  per pt last PSA less than 0.1 on May 2017   Nocturia    Wears glasses    Past Surgical History:  Procedure Laterality Date   COLONOSCOPY  last one 07-27-2014  dr Ewing Schlein   ELBOW SURGERY Right 1989   HEMORRHOID SURGERY N/A 08/28/2016   Procedure: HEMORRHOIDECTOMY;  Surgeon: Berna Bue, MD;  Location: [facility];  Service: General;  Laterality: N/A;  KNEE ARTHROSCOPY Left 2002  approx.   MELANOMA EXCISION  2017   posterior neck (localized)   PROSTATECTOMY  1999   dr Isabel Caprice   SKIN CANCER EXCISION  03/2022   nose   Family History  Problem Relation Age of Onset   Colon cancer Mother    Heart attack Father    Colon cancer Maternal Uncle    Social History   Socioeconomic History   Marital status: Married    Spouse name: Not on file   Number of children: 2   Years of education: 18   Highest education level: Not on file  Occupational History   Occupation: Corporate investment banker   Occupation: Retired   Tobacco Use   Smoking status: Light Smoker    Types: Cigars    Last attempt to quit: 08/18/2006    Years since quitting: 16.4   Smokeless tobacco: Never   Tobacco comments:    smokes an occasional cigar 4-5 timees per year  Vaping Use   Vaping status: Never Used  Substance and Sexual Activity   Alcohol use: Yes    Comment: 2 DRINKS DAILY   Drug use: No   Sexual activity: Yes  Other Topics Concern   Not on file  Social History Narrative   Not on file   Social Drivers of Health   Financial Resource Strain: Low Risk  (01/11/2023)   Overall Financial Resource Strain (CARDIA)    Difficulty of Paying Living Expenses: Not hard at all  Food Insecurity: No Food Insecurity (01/11/2023)   Hunger Vital Sign    Worried About Running Out of Food in the Last Year: Never true    Ran Out of Food in the Last Year: Never true  Transportation Needs: No Transportation Needs (01/11/2023)   PRAPARE - Administrator, Civil Service (Medical): No    Lack of Transportation (Non-Medical): No  Physical Activity: Insufficiently Active (01/11/2023)   Exercise Vital Sign    Days of Exercise per Week: 3 days    Minutes of Exercise per Session: 20 min  Stress: No Stress Concern Present (01/11/2023)   [employer] of Occupational Health - Occupational Stress Questionnaire    Feeling of Stress : Not at all  Social Connections: Socially Integrated (01/11/2023)   Social Connection and Isolation Panel [NHANES]    Frequency of Communication with Friends and Family: More than three times a week    Frequency of Social Gatherings with Friends and Family: More than three times a week    Attends Religious Services: 1 to 4 times per year    Active Member of [employer] or Organizations: Yes    Attends Engineer, structural: More than 4 times per year    Marital Status: Married    Tobacco Counseling Ready to quit: Not Answered Counseling given: Not Answered Tobacco comments: smokes an  occasional cigar 4-5 timees per year   Clinical Intake:  Pre-visit preparation completed: Yes  Pain : 0-10 Pain Score: 1  Pain Type: Chronic pain Pain Location: Shoulder Pain Orientation: Right Pain Descriptors / Indicators: Discomfort Pain Onset: More than a month ago Pain Frequency: Constant     Nutritional Risks: None Diabetes: No  How often do you need to have someone help you when you read instructions, pamphlets, or other written materials from your doctor or pharmacy?: 1 - Never  Interpreter Needed?: No  Information entered by :: NAllen LPN   Activities of Daily Living    01/07/2023   11:21  AM  In your present state of health, do you have any difficulty performing the following activities:  Hearing? 0  Vision? 0  Difficulty concentrating or making decisions? 0  Walking or climbing stairs? 1  Comment due to knees  Dressing or bathing? 0  Doing errands, shopping? 0  Preparing Food and eating ? N  Using the Toilet? N  In the past six months, have you accidently leaked urine? Y  Comment leaks with cough  Do you have problems with loss of bowel control? N  Managing your Medications? N  Managing your Finances? N  Housekeeping or managing your Housekeeping? N    Patient Care Team: Loyola Mast, MD as PCP - General (Family Medicine) Arminda Resides, MD as Consulting Physician (Dermatology) Sallye Lat, MD as Consulting Physician (Ophthalmology) Kathryne Hitch, MD as Consulting Physician (Orthopedic Surgery)  Indicate any recent Medical Services you may have received from other than [facility] providers in the past year (date may be approximate).     Assessment:   This is a routine wellness examination for Jay.  Hearing/Vision screen Hearing Screening - Comments:: Denies hearing issues Vision Screening - Comments:: Regular eye exams, [facility]   Goals Addressed             This Visit's Progress    Patient Stated        01/11/2023, maintain current health       Depression Screen    01/11/2023    8:48 AM 11/18/2022    9:57 AM 11/19/2021    9:53 AM 11/17/2021    9:56 AM 11/12/2020    9:58 AM 11/11/2020    2:55 PM 11/07/2019    9:41 AM  PHQ 2/9 Scores  PHQ - 2 Score 0 0 0 0 0 0 0    Fall Risk    01/07/2023   11:21 AM 11/18/2022    9:57 AM 11/19/2021    9:52 AM 11/17/2021    9:56 AM 11/15/2021    8:43 AM  Fall Risk   Falls in the past year? 0 0 0 1 0  Number falls in past yr: 0 0 0 0 0  Injury with Fall? 0 0 0 0 0  Risk for fall due to : Medication side effect No Fall Risks No Fall Risks No Fall Risks   Follow up Falls prevention discussed;Falls evaluation completed Falls evaluation completed Falls prevention discussed Falls evaluation completed     MEDICARE RISK AT HOME: Medicare Risk at Home Any stairs in or around the home?: (Patient-Rptd) Yes If so, are there any without handrails?: (Patient-Rptd) No Home free of loose throw rugs in walkways, pet beds, electrical cords, etc?: (Patient-Rptd) No Adequate lighting in your home to reduce risk of falls?: (Patient-Rptd) Yes Life alert?: (Patient-Rptd) No Use of a cane, walker or w/c?: (Patient-Rptd) No Grab bars in the bathroom?: (Patient-Rptd) Yes Shower chair or bench in shower?: (Patient-Rptd) Yes Elevated toilet seat or a handicapped toilet?: (Patient-Rptd) No  TIMED UP AND GO:  Was the test performed?  No    Cognitive Function:        01/11/2023    8:48 AM 11/19/2021    9:54 AM 11/07/2019    9:46 AM  6CIT Screen  What Year? 0 points 0 points 0 points  What month? 0 points 0 points 0 points  What time? 0 points 0 points 0 points  Count back from 20 0 points 0 points 0 points  Months in reverse 0  points 0 points 0 points  Repeat phrase 0 points 0 points 0 points  Total Score 0 points 0 points 0 points    Immunizations Immunization History  Administered Date(s) Administered   Fluad Quad(high Dose 65+) 10/19/2018,  11/07/2019, 10/08/2020, 10/03/2021   Fluad Trivalent(High Dose 65+) 11/18/2022   Hepatitis A, Ped/Adol-2 Dose 11/08/2006   Influenza, High Dose Seasonal PF 12/05/2014, 11/25/2015, 10/30/2016, 12/15/2017   Moderna Covid-19 Vaccine Bivalent Booster 56yrs & up 12/23/2020, 10/22/2021   Moderna SARS-COV2 Booster Vaccination 11/15/2019, 07/11/2020   Moderna Sars-Covid-2 Vaccination 02/24/2019, 03/29/2019   PNEUMOCOCCAL CONJUGATE-20 12/14/2022   Pneumococcal Conjugate-13 12/19/2014   Pneumococcal Polysaccharide-23 06/18/2016   Td 05/30/2014   Tdap 11/08/2006   Zoster Recombinant(Shingrix) 12/08/2019, 02/19/2020   Zoster, Live 05/30/2014    TDAP status: Up to date  Flu Vaccine status: Up to date  Pneumococcal vaccine status: Up to date  Covid-19 vaccine status: Information provided on how to obtain vaccines.   Qualifies for Shingles Vaccine? Yes   Zostavax completed Yes   Shingrix Completed?: Yes  Screening Tests Health Maintenance  Topic Date Due   COVID-19 Vaccine (7 - 2024-25 season) 09/20/2022   Medicare Annual Wellness (AWV)  01/11/2024   DTaP/Tdap/Td (3 - Td or Tdap) 05/29/2024   Colonoscopy  04/03/2030   Pneumonia Vaccine 39+ Years old  Completed   INFLUENZA VACCINE  Completed   Hepatitis C Screening  Completed   Zoster Vaccines- Shingrix  Completed   HPV VACCINES  Aged Out    Health Maintenance  Health Maintenance Due  Topic Date Due   COVID-19 Vaccine (7 - 2024-25 season) 09/20/2022    Colorectal cancer screening: Type of screening: Colonoscopy. Completed 04/02/2020. Repeat every 5 years  Lung Cancer Screening: (Low Dose CT Chest recommended if Age 9-80 years, 20 pack-year currently smoking OR have quit w/in 15years.) does not qualify.   Lung Cancer Screening Referral: no  Additional Screening:  Hepatitis C Screening: does qualify; Completed 12/05/2014  Vision Screening: Recommended annual ophthalmology exams for early detection of glaucoma and other  disorders of the eye. Is the patient up to date with their annual eye exam?  Yes  Who is the provider or what is the name of the office in which the patient attends annual eye exams? [facility] If pt is not established with a provider, would they like to be referred to a provider to establish care? No .   Dental Screening: Recommended annual dental exams for proper oral hygiene  Diabetic Foot Exam: n/a  Community Resource Referral / Chronic Care Management: CRR required this visit?  No   CCM required this visit?  No     Plan:     I have personally reviewed and noted the following in the patient's chart:   Medical and social history Use of alcohol, tobacco or illicit drugs  Current medications and supplements including opioid prescriptions. Patient is not currently taking opioid prescriptions. Functional ability and status Nutritional status Physical activity Advanced directives List of other physicians Hospitalizations, surgeries, and ER visits in previous 12 months Vitals Screenings to include cognitive, depression, and falls Referrals and appointments  In addition, I have reviewed and discussed with patient certain preventive protocols, quality metrics, and best practice recommendations. A written personalized care plan for preventive services as well as general preventive health recommendations were provided to patient.     Barb Merino, LPN   16/10/9602   After Visit Summary: (MyChart) Due to this being a telephonic visit, the  after visit summary with patients personalized plan was offered to patient via MyChart   Nurse Notes: none

## 2023-01-11 NOTE — Patient Instructions (Signed)
Mr. Oswald , Thank you for taking time to come for your Medicare Wellness Visit. I appreciate your ongoing commitment to your health goals. Please review the following plan we discussed and let me know if I can assist you in the future.   Referrals/Orders/Follow-Ups/Clinician Recommendations: none  This is a list of the screening recommended for you and due dates:  Health Maintenance  Topic Date Due   COVID-19 Vaccine (7 - 2024-25 season) 09/20/2022   Medicare Annual Wellness Visit  01/11/2024   DTaP/Tdap/Td vaccine (3 - Td or Tdap) 05/29/2024   Colon Cancer Screening  04/03/2030   Pneumonia Vaccine  Completed   Flu Shot  Completed   Hepatitis C Screening  Completed   Zoster (Shingles) Vaccine  Completed   HPV Vaccine  Aged Out    Advanced directives: (Copy Requested) Please bring a copy of your health care power of attorney and living will to the office to be added to your chart at your convenience.  Next Medicare Annual Wellness Visit scheduled for next year: Yes  insert Preventive Care attachment Insert FALL PREVENTION attachment if needed

## 2023-02-24 DIAGNOSIS — Z85828 Personal history of other malignant neoplasm of skin: Secondary | ICD-10-CM | POA: Diagnosis not present

## 2023-02-24 DIAGNOSIS — L905 Scar conditions and fibrosis of skin: Secondary | ICD-10-CM | POA: Diagnosis not present

## 2023-02-24 DIAGNOSIS — L570 Actinic keratosis: Secondary | ICD-10-CM | POA: Diagnosis not present

## 2023-02-24 DIAGNOSIS — D224 Melanocytic nevi of scalp and neck: Secondary | ICD-10-CM | POA: Diagnosis not present

## 2023-02-24 DIAGNOSIS — C44519 Basal cell carcinoma of skin of other part of trunk: Secondary | ICD-10-CM | POA: Diagnosis not present

## 2023-02-24 DIAGNOSIS — L814 Other melanin hyperpigmentation: Secondary | ICD-10-CM | POA: Diagnosis not present

## 2023-02-24 DIAGNOSIS — Z85820 Personal history of malignant melanoma of skin: Secondary | ICD-10-CM | POA: Diagnosis not present

## 2023-02-24 DIAGNOSIS — L821 Other seborrheic keratosis: Secondary | ICD-10-CM | POA: Diagnosis not present

## 2023-02-24 DIAGNOSIS — D485 Neoplasm of uncertain behavior of skin: Secondary | ICD-10-CM | POA: Diagnosis not present

## 2023-09-22 ENCOUNTER — Encounter: Payer: Self-pay | Admitting: Orthopaedic Surgery

## 2023-09-22 ENCOUNTER — Other Ambulatory Visit (INDEPENDENT_AMBULATORY_CARE_PROVIDER_SITE_OTHER): Payer: Self-pay

## 2023-09-22 ENCOUNTER — Ambulatory Visit (INDEPENDENT_AMBULATORY_CARE_PROVIDER_SITE_OTHER): Admitting: Orthopaedic Surgery

## 2023-09-22 DIAGNOSIS — M79601 Pain in right arm: Secondary | ICD-10-CM

## 2023-09-22 MED ORDER — METHYLPREDNISOLONE 4 MG PO TABS: ORAL_TABLET | ORAL | 0 refills | Status: DC

## 2023-09-22 MED ORDER — GABAPENTIN 300 MG PO CAPS: 300.0000 mg | ORAL_CAPSULE | Freq: Every day | ORAL | 0 refills | Status: DC

## 2023-09-22 NOTE — Progress Notes (Signed)
 The patient is a 73 year old active gentleman who is several years out from a rotator cuff repair of a significant tear of his right shoulder.  He comes in today with several months worth of right arm weakness and pain with numbness and tingling in his fingers.  He denies any neck pain or any significant shoulder weakness but he is having hard time with actives daily living in terms of having difficulty even squeezing a toothpaste tube.  He does not get a comfortable sleep at night.  He has taken ibuprofen at night.  He is not a diabetic.  He denies any specific injuries.  He is never had neck surgery.  On examination today he can lift and abduct his right shoulder as well as externally rotate the shoulder.  He is well located and does not exhibit a lot of pain throughout the arc of motion of the shoulder.  He seems to be hurting in the paraspinal muscles to the right side of his neck.  He does have a positive Spurling sign to the right side.  He also has a weak grip and pinch strength on his dominant right side.  X-rays of the right shoulder show no acute findings.  The humeral head is not high riding.  The shoulder is well located.  Cervical spine x-rays show degenerative changes at the mid cervical spine and on the AP view shows significant mid cervical facet arthritis to the right side.  His signs and symptoms are consistent with nerve compression.  I believe it is likely from the neck and not from the carpal tunnel on the right side but certainly he could have a double crush phenomenon.  Given his x-ray findings and clinical exam findings, a MRI of the cervical spine is warranted to rule out nerve compression.  We will start a steroid taper as well as 300 mg of gabapentin  to take at bedtime.  We will see him back in follow-up once we have the MRI of his cervical spine.  He agrees with the treatment plan.  All questions and concerns were addressed and answered.

## 2023-09-23 ENCOUNTER — Other Ambulatory Visit: Payer: Self-pay

## 2023-09-23 DIAGNOSIS — M79601 Pain in right arm: Secondary | ICD-10-CM

## 2023-09-28 ENCOUNTER — Ambulatory Visit
Admission: RE | Admit: 2023-09-28 | Discharge: 2023-09-28 | Disposition: A | Discharge status: Home / Self Care | Source: Ambulatory Visit | Attending: Orthopaedic Surgery | Admitting: Orthopaedic Surgery

## 2023-09-28 DIAGNOSIS — M79601 Pain in right arm: Secondary | ICD-10-CM

## 2023-09-28 DIAGNOSIS — M4802 Spinal stenosis, cervical region: Secondary | ICD-10-CM | POA: Diagnosis not present

## 2023-09-28 DIAGNOSIS — M50123 Cervical disc disorder at C6-C7 level with radiculopathy: Secondary | ICD-10-CM | POA: Diagnosis not present

## 2023-09-28 DIAGNOSIS — M4722 Other spondylosis with radiculopathy, cervical region: Secondary | ICD-10-CM | POA: Diagnosis not present

## 2023-10-01 DIAGNOSIS — M4802 Spinal stenosis, cervical region: Secondary | ICD-10-CM | POA: Diagnosis not present

## 2023-10-01 DIAGNOSIS — M4722 Other spondylosis with radiculopathy, cervical region: Secondary | ICD-10-CM | POA: Diagnosis not present

## 2023-10-01 DIAGNOSIS — M50123 Cervical disc disorder at C6-C7 level with radiculopathy: Secondary | ICD-10-CM | POA: Diagnosis not present

## 2023-10-06 ENCOUNTER — Ambulatory Visit: Admitting: Orthopaedic Surgery

## 2023-10-06 ENCOUNTER — Encounter: Payer: Self-pay | Admitting: Physician Assistant

## 2023-10-06 ENCOUNTER — Ambulatory Visit: Admitting: Physician Assistant

## 2023-10-06 DIAGNOSIS — M4802 Spinal stenosis, cervical region: Secondary | ICD-10-CM | POA: Diagnosis not present

## 2023-10-06 DIAGNOSIS — R202 Paresthesia of skin: Secondary | ICD-10-CM

## 2023-10-06 NOTE — Progress Notes (Signed)
 HPI: Mr. Claudene he returns today to go over the MRI of his cervical spine.  He continues to have pain in his right shoulder region mainly periscapular.  He is having numbness in his thumb index long finger.  He is complaining of weakness with gripping his right hand.  Ranks pain at a 3-4 out of 10 pain.  States the pain in the hand is most bothersome.  Denies any radicular symptoms currently on the left but occasionally will have numbness tingling down the left arm.  Denies any significant neck pain.  He does note stiffness that he attributes to arthritis in his neck.  Notes when holding a phone or driving that his right hand goes numb.  MRI cervical spine was reviewed with the patient.  MRI shows C2-C3 with mild left C3 foraminal narrowing C3-C4 with severe left and moderate right C4 foraminal narrowing.  C4-C5 with moderate right facet arthrosis.  Moderate bilateral C5 foraminal narrowing.  C5-C6 disc space narrowing with segmental fusion.  C6-C7 with moderate left worse than right C8 7 foraminal narrowing.  No significant spinal stenosis throughout C-spine.  Physical exam: General Well-developed well-nourished male no acute distress mood affect appropriate. Psych: Alert and oriented x 3 Bilateral hands: Full motor bilaterally.  Decreased subjective sensation over the right thumb through the radial aspect of the ring finger.  Has weakness on the right with okay sign against resistance. Bilateral upper extremities: Tenderness right upper medial scapular border and into the trapezius region with palpation.  No tenderness on the left.  Triceps bicep strength is 5 out of 5 against resistance bilaterally.  Bilateral wrist positive Tinel's over the median nerve right wrist positive Phalen's right.  Cervical spine: Good range of motion of the cervical spine without significant pain or radicular symptoms.  Negative Spurling's bilaterally.  Impression: Cervical foraminal stenosis left greater than right multiple  levels Paresthesia right hand  Plan: Given his MRI and his clinical findings recommend EMG nerve conduction studies to rule out carpal tunnel syndrome right hand.  He may still benefit from epidural steroid injection cervical spine.  We will have him follow-up with us  after nerve conduction studies.  The results and discuss further treatment.  He will continue his gabapentin .

## 2023-10-06 NOTE — Addendum Note (Signed)
 Addended by: PETER FRIEZE B on: 10/06/2023 01:21 PM   Modules accepted: Orders

## 2023-10-26 ENCOUNTER — Other Ambulatory Visit: Payer: Self-pay | Admitting: Orthopaedic Surgery

## 2023-10-26 ENCOUNTER — Ambulatory Visit: Admitting: Physical Medicine and Rehabilitation

## 2023-10-26 ENCOUNTER — Encounter: Payer: Self-pay | Admitting: Orthopaedic Surgery

## 2023-10-26 DIAGNOSIS — M79601 Pain in right arm: Secondary | ICD-10-CM | POA: Diagnosis not present

## 2023-10-26 DIAGNOSIS — Z23 Encounter for immunization: Secondary | ICD-10-CM | POA: Diagnosis not present

## 2023-10-26 DIAGNOSIS — R29898 Other symptoms and signs involving the musculoskeletal system: Secondary | ICD-10-CM

## 2023-10-26 DIAGNOSIS — M79641 Pain in right hand: Secondary | ICD-10-CM | POA: Diagnosis not present

## 2023-10-26 DIAGNOSIS — R202 Paresthesia of skin: Secondary | ICD-10-CM | POA: Diagnosis not present

## 2023-10-26 MED ORDER — TRAMADOL HCL 50 MG PO TABS: 50.0000 mg | ORAL_TABLET | Freq: Two times a day (BID) | ORAL | 0 refills | Status: DC | PRN

## 2023-10-26 NOTE — Progress Notes (Signed)
 Pain Scale   Average Pain 10 Patient advising he had Numbness, Tingling, and Weakness with increasing pain in his right hand, Patient is right hand dominate.        +Driver, -BT, -Dye Allergies.

## 2023-10-26 NOTE — Progress Notes (Signed)
 Daniel Barber - 73 y.o. male MRN [id number]  Date of birth: 1950-12-17  Office Visit Note: Visit Date: 10/26/2023 PCP: Thedora Garnette HERO, MD Referred by: Gretta Bertrum ORN, PA-C  Subjective: Chief Complaint  Patient presents with   Right Hand - Pain   HPI: Daniel Barber is a 73 y.o. male who comes in today at the request of Tory Gretta, PA-C for evaluation and management of chronic, worsening and severe pain, numbness and tingling in the Right upper extremities.  Patient is Right hand dominant.  He reports pain numbness and tingling going on now for many months and is right hand and in particular the radial digits and thumb and index finger.  He feels like his grip strength is getting weaker and he is dropping some objects.  He gets more numbness and weakness than pain.  He does have some neck pain and stiffness but no frank radicular symptoms down the arms.  Nothing really on the left hand.  He gets worsening at night and with holding his phone or driving.  He also gets symptoms around the right scapular region at times.  He is not diabetic.   I spent more than 30 minutes speaking face-to-face with the patient with 50% of the time in counseling and discussing coordination of care.      Review of Systems  Musculoskeletal:  Positive for joint pain and neck pain.  Neurological:  Positive for tingling and focal weakness.  All other systems reviewed and are negative.  Otherwise per HPI.  Assessment & Plan: Visit Diagnoses:    ICD-10-CM   1. Paresthesia of skin  R20.2 NCV with EMG (electromyography)    2. Right arm pain  M79.601     3. Right hand pain  M79.641     4. Right hand weakness  R29.898        Plan: mpression: The above electrodiagnostic study is ABNORMAL and reveals evidence of a severe right median nerve entrapment at the wrist (carpal tunnel syndrome) affecting sensory and motor components.   There is no significant electrodiagnostic evidence of any other focal  nerve entrapment, brachial plexopathy or cervical radiculopathy.   Recommendations: 1.  Follow-up with referring physician. 2.  Continue current management of symptoms. 3.  Suggest surgical evaluation.  Meds & Orders: No orders of the defined types were placed in this encounter.   Orders Placed This Encounter  Procedures   NCV with EMG (electromyography)    Follow-up: Return for Tory Gretta, P.A.-C.   Procedures: No procedures performed  EMG & NCV Findings: Evaluation of the right median motor nerve showed prolonged distal onset latency (6.8 ms), reduced amplitude (2.5 mV), and decreased conduction velocity (Elbow-Wrist, 47 m/s).  The right median (across palm) sensory nerve showed prolonged distal peak latency (Wrist, 5.7 ms) and prolonged distal peak latency (Palm, 4.3 ms).  All remaining nerves (as indicated in the following tables) were within normal limits.    Needle evaluation of the right abductor pollicis brevis muscle showed increased insertional activity and increased spontaneous activity.  All remaining muscles (as indicated in the following table) showed no evidence of electrical instability.    Impression: The above electrodiagnostic study is ABNORMAL and reveals evidence of a severe right median nerve entrapment at the wrist (carpal tunnel syndrome) affecting sensory and motor components.   There is no significant electrodiagnostic evidence of any other focal nerve entrapment, brachial plexopathy or cervical radiculopathy.   Recommendations: 1.  Follow-up with referring physician. 2.  Continue current management of symptoms. 3.  Suggest surgical evaluation.  ___________________________ Prentice Masters FAAPMR Board Certified, American Board of Physical Medicine and Rehabilitation    Nerve Conduction Studies Anti Sensory Summary Table   Stim Site NR Peak (ms) Norm Peak (ms) P-T Amp (V) Norm P-T Amp Site1 Site2 Delta-P (ms) Dist (cm) Vel (m/s) Norm Vel (m/s)  Right  Median Acr Palm Anti Sensory (2nd Digit)  30C  Wrist    *5.7 <3.6 13.0 >10 Wrist Palm 1.4 0.0    Palm    *4.3 <2.0 2.7         Right Radial Anti Sensory (Base 1st Digit)  30.2C  Wrist    2.1 <3.1 22.0  Wrist Base 1st Digit 2.1 0.0    Right Ulnar Anti Sensory (5th Digit)  30.4C  Wrist    3.5 <3.7 24.9 >15.0 Wrist 5th Digit 3.5 14.0 40 >38   Motor Summary Table   Stim Site NR Onset (ms) Norm Onset (ms) O-P Amp (mV) Norm O-P Amp Site1 Site2 Delta-0 (ms) Dist (cm) Vel (m/s) Norm Vel (m/s)  Right Median Motor (Abd Poll Brev)  30.4C  Wrist    *6.8 <4.2 *2.5 >5 Elbow Wrist 5.2 24.5 *47 >50  Elbow    12.0  2.1         Right Ulnar Motor (Abd Dig Min)  30.4C  Wrist    3.2 <4.2 11.4 >3 B Elbow Wrist 4.5 25.0 56 >53  B Elbow    7.7  5.6  A Elbow B Elbow 1.4 10.0 71 >53  A Elbow    9.1  10.9          EMG   Side Muscle Nerve Root Ins Act Fibs Psw Amp Dur Poly Recrt Int Bruna Comment  Right Abd Poll Brev Median C8-T1 *Incr *3+ *3+ Nml Nml 0 Nml Nml   Right 1stDorInt Ulnar C8-T1 Nml Nml Nml Nml Nml 0 Nml Nml   Right PronatorTeres Median C6-7 Nml Nml Nml Nml Nml 0 Nml Nml   Right Biceps Musculocut C5-6 Nml Nml Nml Nml Nml 0 Nml Nml     Nerve Conduction Studies Anti Sensory Left/Right Comparison   Stim Site L Lat (ms) R Lat (ms) L-R Lat (ms) L Amp (V) R Amp (V) L-R Amp (%) Site1 Site2 L Vel (m/s) R Vel (m/s) L-R Vel (m/s)  Median Acr Palm Anti Sensory (2nd Digit)  30C  Wrist  *5.7   13.0  Wrist Palm     Palm  *4.3   2.7        Radial Anti Sensory (Base 1st Digit)  30.2C  Wrist  2.1   22.0  Wrist Base 1st Digit     Ulnar Anti Sensory (5th Digit)  30.4C  Wrist  3.5   24.9  Wrist 5th Digit  40    Motor Left/Right Comparison   Stim Site L Lat (ms) R Lat (ms) L-R Lat (ms) L Amp (mV) R Amp (mV) L-R Amp (%) Site1 Site2 L Vel (m/s) R Vel (m/s) L-R Vel (m/s)  Median Motor (Abd Poll Brev)  30.4C  Wrist  *6.8   *2.5  Elbow Wrist  *47   Elbow  12.0   2.1        Ulnar Motor (Abd Dig Min)   30.4C  Wrist  3.2   11.4  B Elbow Wrist  56   B Elbow  7.7   5.6  A Elbow B Elbow  71  A Elbow  9.1   10.9           Waveforms:            Clinical History: No specialty comments available.   He reports that he has been smoking cigars. He has never used smokeless tobacco. No results for input(s): HGBA1C, LABURIC in the last 8760 hours.  Objective:  VS:  HT:    WT:   BMI:     BP:   HR: bpm  TEMP: ( )  RESP:  Physical Exam Vitals and nursing note reviewed.  Constitutional:      General: He is not in acute distress.    Appearance: Normal appearance. He is well-developed. He is obese.  HENT:     Head: Normocephalic and atraumatic.  Eyes:     Conjunctiva/sclera: Conjunctivae normal.     Pupils: Pupils are equal, round, and reactive to light.  Cardiovascular:     Rate and Rhythm: Normal rate.     Pulses: Normal pulses.     Heart sounds: Normal heart sounds.  Pulmonary:     Effort: Pulmonary effort is normal. No respiratory distress.  Musculoskeletal:        General: Tenderness present.     Cervical back: Normal range of motion and neck supple. No rigidity.     Right lower leg: No edema.     Left lower leg: No edema.     Comments: Inspection reveals no atrophy of the bilateral APB or FDI or hand intrinsics. There is no swelling, color changes, allodynia or dystrophic changes. There is 5 out of 5 strength in the bilateral wrist extension, finger abduction and long finger flexion. There is impaired sensation to light touch in the right median nerve nerve distributions. There is a negative Froment's test bilaterally. There is a positive Phalen's test on the right. There is a negative Hoffmann's test bilaterally.  Skin:    General: Skin is warm and dry.     Findings: No erythema or rash.  Neurological:     General: No focal deficit present.     Mental Status: He is alert and oriented to person, place, and time.     Sensory: No sensory deficit.     Motor: No weakness  or abnormal muscle tone.     Coordination: Coordination normal.     Gait: Gait normal.  Psychiatric:        Mood and Affect: Mood normal.        Behavior: Behavior normal.        Thought Content: Thought content normal.     Ortho Exam  Imaging: No results found.  Past Medical/Family/Surgical/Social History: Medications & Allergies reviewed per EMR, new medications updated. Patient Active Problem List   Diagnosis Date Noted   History of colonic polyps 11/18/2022   External hemorrhoids 11/18/2022   History of prostate cancer 11/11/2020   Status post right rotator cuff repair 09/02/2020   History of hyperlipidemia, mixed 12/15/2017   Arthritis 12/14/2016   Gastroesophageal reflux disease without esophagitis 12/14/2016   Past Medical History:  Diagnosis Date   Arthritis    COPD, mild (HCC)    External hemorrhoids    symptomatic   GERD (gastroesophageal reflux disease)    History of adenomatous polyp of colon followed by dr rosalie   tubular adenoma's 1995;  2000;  2006;  2016   History of malignant melanoma of skin 2017   excision posterior neck (localized)   History of prostate cancer followed  by pcp   1999--  radical prostatectomy/  per pt last PSA less than 0.1 on May 2017   Nocturia    Wears glasses    Family History  Problem Relation Age of Onset   Colon cancer Mother    Heart attack Father    Colon cancer Maternal Uncle    Past Surgical History:  Procedure Laterality Date   COLONOSCOPY  last one 07-27-2014  dr rosalie   ELBOW SURGERY Right 1989   HEMORRHOID SURGERY N/A 08/28/2016   Procedure: HEMORRHOIDECTOMY;  Surgeon: Signe Mitzie LABOR, MD;  Location: [facility];  Service: General;  Laterality: N/A;   KNEE ARTHROSCOPY Left 2002  approx.   MELANOMA EXCISION  2017   posterior neck (localized)   PROSTATECTOMY  1999   dr alline   SKIN CANCER EXCISION  03/2022   nose   Social History   Occupational History   Occupation: Corporate investment banker    Occupation: Retired  Tobacco Use   Smoking status: Light Smoker    Types: Cigars    Last attempt to quit: 08/18/2006    Years since quitting: 17.2   Smokeless tobacco: Never   Tobacco comments:    smokes an occasional cigar 4-5 timees per year  Vaping Use   Vaping status: Never Used  Substance and Sexual Activity   Alcohol use: Yes    Comment: 2 DRINKS DAILY   Drug use: No   Sexual activity: Yes

## 2023-11-01 NOTE — Procedures (Signed)
 EMG & NCV Findings: Evaluation of the right median motor nerve showed prolonged distal onset latency (6.8 ms), reduced amplitude (2.5 mV), and decreased conduction velocity (Elbow-Wrist, 47 m/s).  The right median (across palm) sensory nerve showed prolonged distal peak latency (Wrist, 5.7 ms) and prolonged distal peak latency (Palm, 4.3 ms).  All remaining nerves (as indicated in the following tables) were within normal limits.    Needle evaluation of the right abductor pollicis brevis muscle showed increased insertional activity and increased spontaneous activity.  All remaining muscles (as indicated in the following table) showed no evidence of electrical instability.    Impression: The above electrodiagnostic study is ABNORMAL and reveals evidence of a severe right median nerve entrapment at the wrist (carpal tunnel syndrome) affecting sensory and motor components.   There is no significant electrodiagnostic evidence of any other focal nerve entrapment, brachial plexopathy or cervical radiculopathy.   Recommendations: 1.  Follow-up with referring physician. 2.  Continue current management of symptoms. 3.  Suggest surgical evaluation.  ___________________________ Prentice Masters FAAPMR Board Certified, American Board of Physical Medicine and Rehabilitation    Nerve Conduction Studies Anti Sensory Summary Table   Stim Site NR Peak (ms) Norm Peak (ms) P-T Amp (V) Norm P-T Amp Site1 Site2 Delta-P (ms) Dist (cm) Vel (m/s) Norm Vel (m/s)  Right Median Acr Palm Anti Sensory (2nd Digit)  30C  Wrist    *5.7 <3.6 13.0 >10 Wrist Palm 1.4 0.0    Palm    *4.3 <2.0 2.7         Right Radial Anti Sensory (Base 1st Digit)  30.2C  Wrist    2.1 <3.1 22.0  Wrist Base 1st Digit 2.1 0.0    Right Ulnar Anti Sensory (5th Digit)  30.4C  Wrist    3.5 <3.7 24.9 >15.0 Wrist 5th Digit 3.5 14.0 40 >38   Motor Summary Table   Stim Site NR Onset (ms) Norm Onset (ms) O-P Amp (mV) Norm O-P Amp Site1 Site2  Delta-0 (ms) Dist (cm) Vel (m/s) Norm Vel (m/s)  Right Median Motor (Abd Poll Brev)  30.4C  Wrist    *6.8 <4.2 *2.5 >5 Elbow Wrist 5.2 24.5 *47 >50  Elbow    12.0  2.1         Right Ulnar Motor (Abd Dig Min)  30.4C  Wrist    3.2 <4.2 11.4 >3 B Elbow Wrist 4.5 25.0 56 >53  B Elbow    7.7  5.6  A Elbow B Elbow 1.4 10.0 71 >53  A Elbow    9.1  10.9          EMG   Side Muscle Nerve Root Ins Act Fibs Psw Amp Dur Poly Recrt Int Bruna Comment  Right Abd Poll Brev Median C8-T1 *Incr *3+ *3+ Nml Nml 0 Nml Nml   Right 1stDorInt Ulnar C8-T1 Nml Nml Nml Nml Nml 0 Nml Nml   Right PronatorTeres Median C6-7 Nml Nml Nml Nml Nml 0 Nml Nml   Right Biceps Musculocut C5-6 Nml Nml Nml Nml Nml 0 Nml Nml     Nerve Conduction Studies Anti Sensory Left/Right Comparison   Stim Site L Lat (ms) R Lat (ms) L-R Lat (ms) L Amp (V) R Amp (V) L-R Amp (%) Site1 Site2 L Vel (m/s) R Vel (m/s) L-R Vel (m/s)  Median Acr Palm Anti Sensory (2nd Digit)  30C  Wrist  *5.7   13.0  Wrist Palm     Palm  *4.3   2.7  Radial Anti Sensory (Base 1st Digit)  30.2C  Wrist  2.1   22.0  Wrist Base 1st Digit     Ulnar Anti Sensory (5th Digit)  30.4C  Wrist  3.5   24.9  Wrist 5th Digit  40    Motor Left/Right Comparison   Stim Site L Lat (ms) R Lat (ms) L-R Lat (ms) L Amp (mV) R Amp (mV) L-R Amp (%) Site1 Site2 L Vel (m/s) R Vel (m/s) L-R Vel (m/s)  Median Motor (Abd Poll Brev)  30.4C  Wrist  *6.8   *2.5  Elbow Wrist  *47   Elbow  12.0   2.1        Ulnar Motor (Abd Dig Min)  30.4C  Wrist  3.2   11.4  B Elbow Wrist  56   B Elbow  7.7   5.6  A Elbow B Elbow  71   A Elbow  9.1   10.9           Waveforms:

## 2023-11-08 ENCOUNTER — Encounter: Payer: Self-pay | Admitting: Orthopaedic Surgery

## 2023-11-08 ENCOUNTER — Encounter: Payer: Self-pay | Admitting: Physical Medicine and Rehabilitation

## 2023-11-08 ENCOUNTER — Ambulatory Visit: Admitting: Orthopaedic Surgery

## 2023-11-08 ENCOUNTER — Encounter: Payer: Self-pay | Admitting: Family Medicine

## 2023-11-08 DIAGNOSIS — G5601 Carpal tunnel syndrome, right upper limb: Secondary | ICD-10-CM

## 2023-11-08 NOTE — Progress Notes (Signed)
 The patient is a 73 year old right-hand-dominant male well-known to us .  He is following up after having right upper extremity nerve conduction studies due to significant numbness and tingling in his right hand as well as some weakness.  On exam there is no muscle atrophy but does have weak pinch and grip strength.  There is a positive Phalen's and Tinel's exam with mainly numbness in the thumb.  The EMG/nerve conduction studies show severe median nerve entrapment at the wrist on the right side showing severe carpal tunnel syndrome.  I did explain with the nurse that his main and we have recommended an open carpal tunnel release.  We described the risks and benefits of the surgery and what to expect from an intraoperative and postoperative standpoint.  Given his discomfort he would like to proceed with surgery in the near future.  We will work on getting this scheduled and be in touch.  We would then see him back in 2 weeks postoperative.

## 2023-11-11 ENCOUNTER — Encounter: Payer: Self-pay | Admitting: Orthopaedic Surgery

## 2023-11-18 ENCOUNTER — Other Ambulatory Visit: Payer: Self-pay | Admitting: Physician Assistant

## 2023-11-18 ENCOUNTER — Other Ambulatory Visit: Payer: Self-pay | Admitting: Orthopaedic Surgery

## 2023-11-18 ENCOUNTER — Other Ambulatory Visit: Payer: Self-pay

## 2023-11-18 MED ORDER — HYDROCODONE-ACETAMINOPHEN 5-325 MG PO TABS: 1.0000 | ORAL_TABLET | Freq: Four times a day (QID) | ORAL | 0 refills | Status: DC | PRN

## 2023-11-18 NOTE — H&P (Signed)
 Daniel Barber is an 73 y.o. male.   Chief Complaint: Right hand pain with numbness and tingling HPI: The patient is a 73 year old right-hand-dominant male with severe carpal tunnel syndrome of his right upper extremity.  This has been verified with clinical exam findings as well as nerve conduction studies/EMG studies that show severe carpal tunnel syndrome.  At this point given his significant symptoms he does wish to proceed with an open carpal tunnel release.  Past Medical History:  Diagnosis Date   Arthritis    Carpal tunnel syndrome, right    COPD, mild (HCC)    External hemorrhoids    symptomatic   GERD (gastroesophageal reflux disease)    History of adenomatous polyp of colon followed by dr rosalie   tubular adenoma's 1995;  2000;  2006;  2016   History of malignant melanoma of skin 2017   excision posterior neck (localized)   History of prostate cancer followed by pcp   1999--  radical prostatectomy/  per pt last PSA less than 0.1 on May 2017   Nocturia    Wears glasses     Past Surgical History:  Procedure Laterality Date   COLONOSCOPY  last one 07-27-2014  dr rosalie   ELBOW SURGERY Right 1989   HEMORRHOID SURGERY N/A 08/28/2016   Procedure: HEMORRHOIDECTOMY;  Surgeon: Signe Mitzie LABOR, MD;  Location: [facility];  Service: General;  Laterality: N/A;   KNEE ARTHROSCOPY Left 2002  approx.   MELANOMA EXCISION  2017   posterior neck (localized)   PROSTATECTOMY  1999   dr alline   SKIN CANCER EXCISION  03/2022   nose    Family History  Problem Relation Age of Onset   Colon cancer Mother    Heart attack Father    Colon cancer Maternal Uncle    Social History:  reports that he has been smoking cigars. He has never used smokeless tobacco. He reports current alcohol use. He reports that he does not use drugs.  Allergies: No Known Allergies  No medications prior to admission.    No results found for this or any previous visit (from the past 48  hours). No results found.  Review of Systems  Height 6' (1.829 m), weight 108.9 kg. Physical Exam Vitals reviewed.  Constitutional:      Appearance: Normal appearance.  HENT:     Head: Normocephalic and atraumatic.  Eyes:     Extraocular Movements: Extraocular movements intact.     Pupils: Pupils are equal, round, and reactive to light.  Cardiovascular:     Rate and Rhythm: Normal rate.  Pulmonary:     Effort: Pulmonary effort is normal.  Abdominal:     Palpations: Abdomen is soft.  Musculoskeletal:     Right hand: Decreased strength. Decreased sensation of the median distribution.     Cervical back: Normal range of motion and neck supple.  Neurological:     Mental Status: He is alert and oriented to person, place, and time.  Psychiatric:        Behavior: Behavior normal.      Assessment/Plan Severe right carpal tunnel syndrome  The plan is to proceed to surgery as an outpatient for a right open carpal tunnel release.  The risks and benefits of surgery have been discussed in detail.  Daniel CINDERELLA Poli, MD 11/18/2023, 6:32 PM

## 2023-11-18 NOTE — Progress Notes (Signed)
 For Anesthesia: PCP - Garnette CHRISTELLA Simpler, MD  Cardiologist - N/A  Bowel Prep reminder:N/A  Chest x-ray - greater than 1 year in [facility] EKG - greater than 1 year in [facility] Stress Test - greater the 2 years  ECHO - N/A Cardiac Cath - N/A Pacemaker/ICD device last checked: N/A Pacemaker orders received:N/A Device Rep notified:N/A  Spinal Cord Stimulator:N/A  Sleep Study - N/A CPAP - N/A  Fasting Blood Sugar - N/A Checks Blood Sugar ___N/A__ times a day Date and result of last Hgb A1c-N/A  Last dose of GLP1 agonist- N/A GLP1 instructions: Hold 7 days prior to schedule (Hold 24 hours-daily)   Last dose of SGLT-2 inhibitors- N/A SGLT-2 instructions: Hold 72 hours prior to surgery  Blood Thinner Instructions:N/A Last Dose: N/A  Time last taken: N/A  Aspirin Instructions:N/A Last Dose: N/A Time last taken: N/A  Activity level: Able to exercise without chest pain and/or shortness of breath    Anesthesia review: N/A  Patient denies shortness of breath, fever, cough and chest pain at PAT appointment   Patient verbalized understanding of instructions that were reviewed over the telephone.

## 2023-11-18 NOTE — Progress Notes (Signed)
 Sent message, via epic in basket, requesting orders in epic from Careers adviser.

## 2023-11-19 ENCOUNTER — Ambulatory Visit (HOSPITAL_COMMUNITY): Admitting: Certified Registered"

## 2023-11-19 ENCOUNTER — Encounter (HOSPITAL_COMMUNITY)
Admission: RE | Disposition: A | Discharge status: Home / Self Care | Payer: Self-pay | Source: Home / Self Care | Attending: Orthopaedic Surgery

## 2023-11-19 ENCOUNTER — Encounter (HOSPITAL_COMMUNITY): Payer: Self-pay | Admitting: Orthopaedic Surgery

## 2023-11-19 ENCOUNTER — Ambulatory Visit (HOSPITAL_COMMUNITY)
Admission: RE | Admit: 2023-11-19 | Discharge: 2023-11-19 | Disposition: A | Discharge status: Home / Self Care | Attending: Orthopaedic Surgery | Admitting: Orthopaedic Surgery

## 2023-11-19 DIAGNOSIS — F17290 Nicotine dependence, other tobacco product, uncomplicated: Secondary | ICD-10-CM | POA: Insufficient documentation

## 2023-11-19 DIAGNOSIS — J449 Chronic obstructive pulmonary disease, unspecified: Secondary | ICD-10-CM | POA: Insufficient documentation

## 2023-11-19 DIAGNOSIS — K219 Gastro-esophageal reflux disease without esophagitis: Secondary | ICD-10-CM | POA: Insufficient documentation

## 2023-11-19 DIAGNOSIS — G5601 Carpal tunnel syndrome, right upper limb: Secondary | ICD-10-CM

## 2023-11-19 DIAGNOSIS — Z9889 Other specified postprocedural states: Secondary | ICD-10-CM

## 2023-11-19 HISTORY — PX: CARPAL TUNNEL RELEASE: SHX101

## 2023-11-19 HISTORY — DX: Carpal tunnel syndrome, right upper limb: G56.01

## 2023-11-19 SURGERY — CARPAL TUNNEL RELEASE
Anesthesia: Monitor Anesthesia Care | Site: Hand | Laterality: Right

## 2023-11-19 MED ORDER — LACTATED RINGERS IV SOLN: INTRAVENOUS | Status: DC

## 2023-11-19 MED ORDER — BUPIVACAINE HCL (PF) 0.25 % IJ SOLN
INTRAMUSCULAR | Status: AC
  Filled 2023-11-19: qty 30

## 2023-11-19 MED ORDER — ONDANSETRON HCL 4 MG/2ML IJ SOLN
INTRAMUSCULAR | Status: AC
  Filled 2023-11-19: qty 2

## 2023-11-19 MED ORDER — MIDAZOLAM HCL (PF) 2 MG/2ML IJ SOLN
INTRAMUSCULAR | Status: DC | PRN
  Administered 2023-11-19 (×2): 1 mg via INTRAVENOUS

## 2023-11-19 MED ORDER — DEXAMETHASONE SOD PHOSPHATE PF 10 MG/ML IJ SOLN
INTRAMUSCULAR | Status: DC | PRN
  Administered 2023-11-19: 4 mg via INTRAVENOUS

## 2023-11-19 MED ORDER — PROPOFOL 500 MG/50ML IV EMUL
INTRAVENOUS | Status: DC | PRN
  Administered 2023-11-19: 75 ug/kg/min via INTRAVENOUS

## 2023-11-19 MED ORDER — MIDAZOLAM HCL 2 MG/2ML IJ SOLN
INTRAMUSCULAR | Status: AC
  Filled 2023-11-19: qty 2

## 2023-11-19 MED ORDER — PROPOFOL 10 MG/ML IV BOLUS
INTRAVENOUS | Status: DC | PRN
Start: 2023-11-19 — End: 2023-11-19
  Administered 2023-11-19: 20 mg via INTRAVENOUS

## 2023-11-19 MED ORDER — LIDOCAINE 2% (20 MG/ML) 5 ML SYRINGE: INTRAMUSCULAR | Status: DC | PRN

## 2023-11-19 MED ORDER — FENTANYL CITRATE (PF) 100 MCG/2ML IJ SOLN
INTRAMUSCULAR | Status: AC
  Filled 2023-11-19: qty 2

## 2023-11-19 MED ORDER — ACETAMINOPHEN 500 MG PO TABS
1000.0000 mg | ORAL_TABLET | Freq: Once | ORAL | Status: AC
  Administered 2023-11-19: 1000 mg via ORAL
  Filled 2023-11-19: qty 2

## 2023-11-19 MED ORDER — LIDOCAINE HCL (PF) 1 % IJ SOLN
INTRAMUSCULAR | Status: DC | PRN
  Administered 2023-11-19: 10 mL

## 2023-11-19 MED ORDER — PROPOFOL 10 MG/ML IV BOLUS
INTRAVENOUS | Status: AC
  Filled 2023-11-19: qty 20

## 2023-11-19 MED ORDER — ONDANSETRON HCL 4 MG/2ML IJ SOLN
INTRAMUSCULAR | Status: DC | PRN
Start: 2023-11-19 — End: 2023-11-19
  Administered 2023-11-19: 4 mg via INTRAVENOUS

## 2023-11-19 MED ORDER — BUPIVACAINE HCL (PF) 0.25 % IJ SOLN
INTRAMUSCULAR | Status: DC | PRN
  Administered 2023-11-19: 10 mL

## 2023-11-19 MED ORDER — FENTANYL CITRATE (PF) 100 MCG/2ML IJ SOLN
INTRAMUSCULAR | Status: DC | PRN
  Administered 2023-11-19: 50 ug via INTRAVENOUS

## 2023-11-19 MED ORDER — LIDOCAINE HCL (PF) 2 % IJ SOLN
INTRAMUSCULAR | Status: AC
Start: 2023-11-19 — End: 2023-11-19
  Filled 2023-11-19: qty 5

## 2023-11-19 MED ORDER — LIDOCAINE HCL (PF) 2 % IJ SOLN
INTRAMUSCULAR | Status: DC | PRN
  Administered 2023-11-19: 60 mg via INTRADERMAL

## 2023-11-19 MED ORDER — LIDOCAINE HCL 1 % IJ SOLN
INTRAMUSCULAR | Status: AC
  Filled 2023-11-19: qty 20

## 2023-11-19 MED ORDER — FENTANYL CITRATE (PF) 50 MCG/ML IJ SOSY: 25.0000 ug | PREFILLED_SYRINGE | INTRAMUSCULAR | Status: DC | PRN

## 2023-11-19 MED ORDER — AMISULPRIDE (ANTIEMETIC) 5 MG/2ML IV SOLN: 10.0000 mg | Freq: Once | INTRAVENOUS | Status: DC | PRN

## 2023-11-19 MED ORDER — ORAL CARE MOUTH RINSE: 15.0000 mL | Freq: Once | OROMUCOSAL | Status: AC

## 2023-11-19 MED ORDER — CHLORHEXIDINE GLUCONATE 0.12 % MT SOLN
15.0000 mL | Freq: Once | OROMUCOSAL | Status: AC
  Administered 2023-11-19: 15 mL via OROMUCOSAL

## 2023-11-19 MED ORDER — PROPOFOL 1000 MG/100ML IV EMUL
INTRAVENOUS | Status: AC
Start: 2023-11-19 — End: 2023-11-19
  Filled 2023-11-19: qty 100

## 2023-11-19 SURGICAL SUPPLY — 30 items
BAG COUNTER SPONGE SURGICOUNT (BAG) IMPLANT
BNDG COHESIVE 4X5 TAN STRL LF (GAUZE/BANDAGES/DRESSINGS) ×2 IMPLANT
BNDG ELASTIC 3INX 5YD STR LF (GAUZE/BANDAGES/DRESSINGS) ×2 IMPLANT
BNDG ELASTIC 4INX 5YD STR LF (GAUZE/BANDAGES/DRESSINGS) IMPLANT
BNDG ESMARK 4X9 LF (GAUZE/BANDAGES/DRESSINGS) ×2 IMPLANT
CORD BIPOLAR FORCEPS 12FT (ELECTRODE) ×2 IMPLANT
COVER MAYO STAND STRL (DRAPES) ×2 IMPLANT
COVER SURGICAL LIGHT HANDLE (MISCELLANEOUS) ×2 IMPLANT
CUFF TOURN SGL QUICK 18X4 (TOURNIQUET CUFF) IMPLANT
CUFF TRNQT CYL 24X4X16.5-23 (TOURNIQUET CUFF) IMPLANT
DRAPE SURG 17X11 SM STRL (DRAPES) ×2 IMPLANT
DRSG XEROFORM 1X8 (GAUZE/BANDAGES/DRESSINGS) IMPLANT
DURAPREP 26ML APPLICATOR (WOUND CARE) ×2 IMPLANT
GAUZE SPONGE 4X4 12PLY STRL (GAUZE/BANDAGES/DRESSINGS) ×2 IMPLANT
GAUZE XEROFORM 1X8 LF (GAUZE/BANDAGES/DRESSINGS) ×2 IMPLANT
GLOVE BIO SURGEON STRL SZ7.5 (GLOVE) ×2 IMPLANT
GLOVE BIOGEL PI IND STRL 8 (GLOVE) ×2 IMPLANT
GLOVE ECLIPSE 8.0 STRL XLNG CF (GLOVE) IMPLANT
GOWN STRL REUS W/ TWL XL LVL3 (GOWN DISPOSABLE) ×2 IMPLANT
KIT BASIN OR (CUSTOM PROCEDURE TRAY) ×2 IMPLANT
NDL HYPO 22X1.5 SAFETY MO (MISCELLANEOUS) ×2 IMPLANT
NEEDLE HYPO 22X1.5 SAFETY MO (MISCELLANEOUS) ×2 IMPLANT
NS IRRIG 1000ML POUR BTL (IV SOLUTION) ×2 IMPLANT
PACK ORTHO EXTREMITY (CUSTOM PROCEDURE TRAY) ×2 IMPLANT
PADDING CAST ABS COTTON 4X4 ST (CAST SUPPLIES) IMPLANT
PROTECTOR NERVE ULNAR (MISCELLANEOUS) ×2 IMPLANT
SUCTION TUBE FRAZIER 10FR DISP (SUCTIONS) IMPLANT
SUT ETHILON 3 0 PS 1 (SUTURE) ×2 IMPLANT
SYR CONTROL 10ML LL (SYRINGE) ×2 IMPLANT
TOWEL OR 17X26 10 PK STRL BLUE (TOWEL DISPOSABLE) ×2 IMPLANT

## 2023-11-19 NOTE — Anesthesia Postprocedure Evaluation (Signed)
 Anesthesia Post Note  Patient: Daniel Barber  Procedure(s) Performed: CARPAL TUNNEL RELEASE (Right: Hand)     Patient location during evaluation: PACU Anesthesia Type: MAC Level of consciousness: awake and alert Pain management: pain level controlled Vital Signs Assessment: post-procedure vital signs reviewed and stable Respiratory status: spontaneous breathing, nonlabored ventilation, respiratory function stable and patient connected to nasal cannula oxygen Cardiovascular status: stable and blood pressure returned to baseline Postop Assessment: no apparent nausea or vomiting Anesthetic complications: no   No notable events documented.  Last Vitals:  Vitals:   11/19/23 1330 11/19/23 1338  BP: 121/78 127/75  Pulse: (!) 58 (!) 54  Resp: 20 16  Temp:  (!) 36.1 C  SpO2: 95% 94%    Last Pain:  Vitals:   11/19/23 1338  TempSrc: Axillary  PainSc: 0-No pain                 Epifanio Lamar BRAVO

## 2023-11-19 NOTE — Anesthesia Procedure Notes (Addendum)
 Procedure Name: MAC Date/Time: 11/19/2023 12:28 PM  Performed by: Metta Andrea NOVAK, CRNAPre-anesthesia Checklist: Patient identified, Emergency Drugs available, Suction available, Patient being monitored and Timeout performed Oxygen Delivery Method: Simple face mask Placement Confirmation: positive ETCO2

## 2023-11-19 NOTE — Anesthesia Preprocedure Evaluation (Signed)
 Anesthesia Evaluation  Patient identified by MRN, date of birth, ID band Patient awake    Reviewed: Allergy & Precautions, NPO status , Patient's Chart, lab work & pertinent test results  Airway Mallampati: II  TM Distance: >3 FB Neck ROM: Full    Dental  (+) Dental Advisory Given   Pulmonary COPD, Current Smoker   breath sounds clear to auscultation       Cardiovascular negative cardio ROS  Rhythm:Regular Rate:Normal     Neuro/Psych  Neuromuscular disease    GI/Hepatic Neg liver ROS,GERD  Medicated,,  Endo/Other  negative endocrine ROS    Renal/GU negative Renal ROS     Musculoskeletal  (+) Arthritis ,    Abdominal   Peds  Hematology negative hematology ROS (+)   Anesthesia Other Findings   Reproductive/Obstetrics                              Anesthesia Physical Anesthesia Plan  ASA: 2  Anesthesia Plan: MAC   Post-op Pain Management: Tylenol  PO (pre-op)*   Induction: Intravenous  PONV Risk Score and Plan: 0 and Propofol  infusion, Ondansetron  and Treatment may vary due to age or medical condition  Airway Management Planned: Natural Airway and Simple Face Mask  Additional Equipment:   Intra-op Plan:   Post-operative Plan:   Informed Consent: I have reviewed the patients History and Physical, chart, labs and discussed the procedure including the risks, benefits and alternatives for the proposed anesthesia with the patient or authorized representative who has indicated his/her understanding and acceptance.       Plan Discussed with:   Anesthesia Plan Comments:         Anesthesia Quick Evaluation

## 2023-11-19 NOTE — Interval H&P Note (Signed)
 History and Physical Interval Note: The patient understands that he is here today for a right open carpal tunnel release to treat his severe right carpal tunnel syndrome.  There has been no acute or interval change in the patient's medical status.  The risks and benefits of surgery have been discussed in detail and informed consent has been obtained.  The right hand and wrist have been marked.  11/19/2023 11:42 AM  Daniel Barber  has presented today for surgery, with the diagnosis of Right Severe Carpal Tunnel Syndrome.  The various methods of treatment have been discussed with the patient and family. After consideration of risks, benefits and other options for treatment, the patient has consented to  Procedure(s) with comments: CARPAL TUNNEL RELEASE (Right) - RIGHT OPEN CARPAL TUNNEL RELEASE as a surgical intervention.  The patient's history has been reviewed, patient examined, no change in status, stable for surgery.  I have reviewed the patient's chart and labs.  Questions were answered to the patient's satisfaction.     [name]

## 2023-11-19 NOTE — Op Note (Signed)
 Operative Note  Date of operation: 11/19/2023 Preoperative diagnosis: Right severe carpal tunnel syndrome Postoperative diagnosis: Same  Procedure: Right open carpal tunnel release  Surgeon: Lonni GRADE. Vernetta, MD Assistant: Tory Gaskins, PA-C  Anesthesia: #1 masked ventilation and IV sedation, #2 local Antibiotics: None EBL: Minimal Tourniquet time: Less than 20 minutes Complications: None  Indications: Charlena is a 73 year old right-hand-dominant male with severe carpal tunnel surgery involving his right upper extremity.  His pain is daily and his numbness and tingling is significant with that right upper extremity.  His clinical exam findings are consistent with carpal tunnel syndrome and he does have a EMG/NCV studies confirming severe carpal tunnel syndrome.  At this point we have recommended open carpal tunnel release and he does wish to proceed with this given his continued symptoms.  We did discuss in detail the risks and benefits of the surgery.  Procedure description: After informed consent was obtained and the appropriate right hand and wrist were marked, the patient was brought to the operating on the stretcher and kept on the stretcher with his right arm or arm table.  A forearm tourniquet was placed and his right hand was prepped and draped with DuraPrep and sterile drapes.  A timeout was called and he was identified as the correct patient the correct right hand.  Mask ventilation and IV sedation was given.  An Esmarch was used to wrap of the hand and the tourniquet was inflated to 250 mm pressure.  We then anesthetized the palm of the hand with 1% lidocaine  followed by quarter percent Marcaine .  Both were planed.  We then made incision over the transverse carpal ligament in the palm of the hand and carried this proximally distally.  We dissected down to the distal edge of the transverse carpal ligament and slowly and meticulously divided it from distal to proximal all while  protecting the median nerve.  We then explored the median nerve afterwards and found it in his motor branch to be intact.  We then irrigated the soft tissue with normal saline solution.  The skin was reapproximated with 3-0 nylon suture.  Xeroform well-padded sterile dressing was applied.  The tourniquet was let down his fingers pink and nicely.  He was taken the recovery room in stable condition.

## 2023-11-19 NOTE — Discharge Instructions (Signed)
 You may use your hand today as comfort allows but no heavy lifting or gripping activities. Do expect some hand swelling -ice and elevation as needed. If the dressing feels too tight you may loosen the dressing. Do keep this current dressing on for the next week and keep it clean and dry. In 1 week you may remove the dressing and get your incision wet daily in the shower.  After you remove the dressing, place large Band-Aids over the incision daily to protect the incision.

## 2023-11-19 NOTE — Transfer of Care (Signed)
 Immediate Anesthesia Transfer of Care Note  Patient: AVYUKTH BONTEMPO  Procedure(s) Performed: CARPAL TUNNEL RELEASE (Right: Hand)  Patient Location: PACU  Anesthesia Type:MAC  Level of Consciousness: drowsy and responds to stimulation  Airway & Oxygen Therapy: Patient Spontanous Breathing and Patient connected to face mask oxygen  Post-op Assessment: Report given to RN and Post -op Vital signs reviewed and stable  Post vital signs: Reviewed and stable  Last Vitals:  Vitals Value Taken Time  BP 123/74 11/19/23 13:00  Temp    Pulse 70 11/19/23 13:02  Resp 17 11/19/23 13:02  SpO2 99 % 11/19/23 13:02  Vitals shown include unfiled device data.  Last Pain:  Vitals:   11/19/23 1029  TempSrc:   PainSc: 8          Complications: No notable events documented.

## 2023-11-20 ENCOUNTER — Encounter (HOSPITAL_COMMUNITY): Payer: Self-pay | Admitting: Orthopaedic Surgery

## 2023-11-22 ENCOUNTER — Ambulatory Visit: Payer: Medicare Other | Admitting: Family Medicine

## 2023-11-22 ENCOUNTER — Ambulatory Visit: Payer: Self-pay | Admitting: Family Medicine

## 2023-11-22 ENCOUNTER — Encounter: Payer: Self-pay | Admitting: Radiology

## 2023-11-22 ENCOUNTER — Encounter: Payer: Self-pay | Admitting: Family Medicine

## 2023-11-22 VITALS — BP 130/70 | HR 58 | Temp 98.6°F | Ht 72.0 in | Wt 251.8 lb

## 2023-11-22 DIAGNOSIS — Z8546 Personal history of malignant neoplasm of prostate: Secondary | ICD-10-CM | POA: Diagnosis not present

## 2023-11-22 DIAGNOSIS — G5601 Carpal tunnel syndrome, right upper limb: Secondary | ICD-10-CM

## 2023-11-22 DIAGNOSIS — E785 Hyperlipidemia, unspecified: Secondary | ICD-10-CM | POA: Diagnosis not present

## 2023-11-22 LAB — LIPID PANEL
Cholesterol: 172 mg/dL (ref 0–200)
HDL: 37.1 mg/dL — ABNORMAL LOW (ref >39.00)
LDL Cholesterol: 100 mg/dL — ABNORMAL HIGH (ref 0–99)
NonHDL: 134.44
Total CHOL/HDL Ratio: 5
Triglycerides: 170 mg/dL — ABNORMAL HIGH (ref 0.0–149.0)
VLDL: 34 mg/dL (ref 0.0–40.0)

## 2023-11-22 LAB — PSA: PSA: 0.02 ng/mL — ABNORMAL LOW (ref 0.10–4.00)

## 2023-11-22 NOTE — Assessment & Plan Note (Signed)
 I will do his annual PSA today for surveillance.

## 2023-11-22 NOTE — Assessment & Plan Note (Signed)
 S/p carpal tunnel release. Doing well. Significant improvement in his pain. He will follow-up with his surgeon next week.

## 2023-11-22 NOTE — Assessment & Plan Note (Signed)
Stable. Continue omeprazole 10mg  daily

## 2023-11-22 NOTE — Assessment & Plan Note (Signed)
 I will reassess his lipids today. He continues to express an unwillingness to consider a statin, but does want to continue to focus on lifestyle approaches.

## 2023-11-22 NOTE — Progress Notes (Signed)
 [facility] [address] [place] [address] Dept: [phone] Dept Fax: [phone]  Chronic Care Office Visit  Subjective:    Patient ID: Daniel Barber, male    DOB: 06-06-1950, 73 y.o..   MRN: [id number]  Chief Complaint  Patient presents with   Follow-up    Follow up.  No concerns.  Fating today.    History of Present Illness:  Patient is in today for reassessment of chronic medical issues.  Daniel Barber recently was diagnosed with severe carpal tunnel syndrome of the right wrist. He had been experiencing pain from the right shoulder down to the right hand. He underwent a carpal tunnel release on Friday. He notes that the right shoulder pain is now resolved. He has had improvement in the tingling in his right hand (1st-3rd fingers) but this ha not resolved.   Daniel Barber has a history of GERD. He manages this with OTC Prilosec 10 mg. He feels this is doing well overall.   Daniel Barber has a prior history of prostate cancer. He underwent prostatectomy in the distant past and has been managing well.   Past Medical History: Patient Active Problem List   Diagnosis Date Noted   Carpal tunnel syndrome of right wrist 11/08/2023   History of colonic polyps 11/18/2022   External hemorrhoids 11/18/2022   History of prostate cancer 11/11/2020   Status post right rotator cuff repair 09/02/2020   History of hyperlipidemia, mixed 12/15/2017   Arthritis 12/14/2016   Gastroesophageal reflux disease without esophagitis 12/14/2016   Past Surgical History:  Procedure Laterality Date   CARPAL TUNNEL RELEASE Right 11/19/2023   Procedure: CARPAL TUNNEL RELEASE;  Surgeon: Vernetta Lonni GRADE, MD;  Location: [facility];  Service: Orthopedics;  Laterality: Right;  RIGHT OPEN CARPAL TUNNEL RELEASE   COLONOSCOPY  last one 07-27-2014  dr rosalie   ELBOW SURGERY Right 1989   HEMORRHOID SURGERY N/A 08/28/2016   Procedure: HEMORRHOIDECTOMY;   Surgeon: Signe Mitzie LABOR, MD;  Location: [facility];  Service: General;  Laterality: N/A;   KNEE ARTHROSCOPY Left 2002  approx.   MELANOMA EXCISION  2017   posterior neck (localized)   PROSTATECTOMY  1999   dr alline   SKIN CANCER EXCISION  03/2022   nose   Family History  Problem Relation Age of Onset   Colon cancer Mother    Heart attack Father    Colon cancer Maternal Uncle    Outpatient Medications Prior to Visit  Medication Sig Dispense Refill   Aluminum & Magnesium Hydroxide (MAGNESIUM-ALUMINUM HYDROXIDES PO) Take 1 tablet by mouth daily as needed (Heartburn).     HYDROcodone -acetaminophen  (NORCO/VICODIN) 5-325 MG tablet Take 1-2 tablets by mouth every 6 (six) hours as needed for moderate pain (pain score 4-6). 30 tablet 0   omeprazole (PRILOSEC) 20 MG capsule Take 20 mg by mouth daily as needed.     traMADol  (ULTRAM ) 50 MG tablet Take 1-2 tablets (50-100 mg total) by mouth every 12 (twelve) hours as needed. 30 tablet 0   No facility-administered medications prior to visit.   No Known Allergies   Objective:   Today's Vitals   11/22/23 0913  BP: 130/70  Pulse: (!) 58  Temp: 98.6 F (37 C)  TempSrc: Temporal  SpO2: 96%  Weight: 251 lb 12.8 oz (114.2 kg)  Height: 6' (1.829 m)   Body mass index is 34.15 kg/m.   General: Well developed, well nourished. No acute distress. Lungs: Clear to  auscultation bilaterally. No wheezing, rales or rhonchi. CV: RRR without murmurs or rubs. Pulses 2+ bilaterally. Psych: Alert and oriented. Normal mood and affect.  Health Maintenance Due  Topic Date Due   Influenza Vaccine  08/20/2023   COVID-19 Vaccine (7 - 2025-26 season) 09/20/2023   Medicare Annual Wellness (AWV)  01/11/2024     Assessment & Plan:   Problem List Items Addressed This Visit       Nervous and Auditory   Carpal tunnel syndrome of right wrist   S/p carpal tunnel release. Doing well. Significant improvement in his pain. He will follow-up  with his surgeon next week.        Other   Borderline hyperlipidemia - Primary   I will reassess his lipids today. He continues to express an unwillingness to consider a statin, but does want to continue to focus on lifestyle approaches.      Relevant Orders   Lipid panel   History of prostate cancer   I will do his annual PSA today for surveillance.      Relevant Orders   PSA   Return in about 1 year (around 11/21/2024) for Reassessment.   Garnette CHRISTELLA Simpler, MD  I,Emily Lagle,acting as a scribe for Garnette CHRISTELLA Simpler, MD.,have documented all relevant documentation on the behalf of Garnette CHRISTELLA Simpler, MD.  I, Garnette CHRISTELLA Simpler, MD, have reviewed all documentation for this visit. The documentation on 11/22/2023 for the exam, diagnosis, procedures, and orders are all accurate and complete.

## 2023-12-02 ENCOUNTER — Encounter: Payer: Self-pay | Admitting: Physician Assistant

## 2023-12-02 ENCOUNTER — Ambulatory Visit (INDEPENDENT_AMBULATORY_CARE_PROVIDER_SITE_OTHER): Admitting: Physician Assistant

## 2023-12-02 DIAGNOSIS — G5601 Carpal tunnel syndrome, right upper limb: Secondary | ICD-10-CM

## 2023-12-02 NOTE — Progress Notes (Signed)
 HPI: Mr.Brocker returns today 2 weeks status post right carpal tunnel release.  He is doing well.  He states the feeling of being back in his index and long finger.  Still has a little numbness in his thumb but feels he is much better than it was prior to surgery.  He has had no fevers or chills.  Having no awakening pain from the hand.  Physical exam: Right hand sensation grossly intact throughout.  Radial pulses 2+.  Surgical incisions healing well no signs of infection or wound dehiscence.  Impression: Status post right carpal tunnel release 11/19/2023  Plan: Sutures harvested Steri-Strips applied.  He will work on scar tissue mobilization.  He is able to get the incision wet with showering for general hand hygiene.  Follow-up with us  in 4 weeks sooner if there is any questions concerns.

## 2023-12-08 DIAGNOSIS — H21541 Posterior synechiae (iris), right eye: Secondary | ICD-10-CM | POA: Diagnosis not present

## 2023-12-08 DIAGNOSIS — H40033 Anatomical narrow angle, bilateral: Secondary | ICD-10-CM | POA: Diagnosis not present

## 2023-12-08 DIAGNOSIS — H2513 Age-related nuclear cataract, bilateral: Secondary | ICD-10-CM | POA: Diagnosis not present

## 2023-12-30 ENCOUNTER — Ambulatory Visit: Admitting: Physician Assistant

## 2023-12-30 ENCOUNTER — Other Ambulatory Visit: Payer: Self-pay | Admitting: Physician Assistant

## 2023-12-30 ENCOUNTER — Encounter: Payer: Self-pay | Admitting: Physician Assistant

## 2023-12-30 DIAGNOSIS — G5601 Carpal tunnel syndrome, right upper limb: Secondary | ICD-10-CM

## 2023-12-30 NOTE — Progress Notes (Signed)
 HPI: [name] returns today status post right carpal tunnel release 11/19/2023.  He states overall he is improving.  He states he is much improved from what he had prior to surgery.  Only complaint is he still has numbness over the thumb involving just the distal phalanx area.  Some weakness in the hand still.  Physical exam: Right hand surgical incision is healing well no signs of infection or dehiscence.  Subjective sensation intact except over the distal end of the thumb volar aspect.  Has full range of motion of the hand otherwise.  There is no triggering of any of the fingers.  Impression: Status post right carpal tunnel release 11/19/2023  Plan: He will follow-up with us  as needed.  Continue to work on scar tissue mobilization.  Questions were encouraged and answered.

## 2023-12-30 NOTE — Progress Notes (Signed)
°  HPI: Daniel Barber returns today status post right carpal tunnel release 11/19/2023.  He states overall he is improving.  He states he is much improved from what he had prior to surgery.  Only complaint is he still has numbness over the thumb involving just the distal phalanx area.  Some weakness in the hand still.   Physical exam: Right hand surgical incision is healing well no signs of infection or dehiscence.  Subjective sensation intact except over the distal end of the thumb volar aspect.  Has full range of motion of the hand otherwise.  There is no triggering of any of the fingers.   Impression: Status post right carpal tunnel release 11/19/2023   Plan: He will follow-up with us  as needed.  Continue to work on scar tissue mobilization.  Questions were encouraged and answered.

## 2024-01-17 ENCOUNTER — Ambulatory Visit: Payer: Medicare Other

## 2024-01-17 VITALS — Ht 72.0 in | Wt 145.0 lb

## 2024-01-17 DIAGNOSIS — Z0000 Encounter for general adult medical examination without abnormal findings: Secondary | ICD-10-CM | POA: Diagnosis not present

## 2024-01-17 NOTE — Patient Instructions (Signed)
 Daniel Barber,  Thank you for taking the time for your Medicare Wellness Visit. I appreciate your continued commitment to your health goals. Please review the care plan we discussed, and feel free to reach out if I can assist you further.  Please note that Annual Wellness Visits do not include a physical exam. Some assessments may be limited, especially if the visit was conducted virtually. If needed, we may recommend an in-person follow-up with your provider.  Ongoing Care Seeing your primary care provider every 3 to 6 months helps us  monitor your health and provide consistent, personalized care.   Referrals If a referral was made during today's visit and you haven't received any updates within two weeks, please contact the referred provider directly to check on the status.  Recommended Screenings:  Health Maintenance  Topic Date Due   COVID-19 Vaccine (7 - 2025-26 season) 09/20/2023   Medicare Annual Wellness Visit  01/11/2024   DTaP/Tdap/Td vaccine (3 - Td or Tdap) 05/29/2024   Colon Cancer Screening  04/03/2030   Pneumococcal Vaccine for age over 28  Completed   Flu Shot  Completed   Hepatitis C Screening  Completed   Zoster (Shingles) Vaccine  Completed   Meningitis B Vaccine  Aged Out       01/13/2024    8:51 AM  Advanced Directives  Does Patient Have a Medical Advance Directive? Yes  Type of Estate Agent of [place];Living will  Does patient want to make changes to medical advance directive? No - Patient declined  Copy of Healthcare Power of Attorney in Chart? No - copy requested    Vision: Annual vision screenings are recommended for early detection of glaucoma, cataracts, and diabetic retinopathy. These exams can also reveal signs of chronic conditions such as diabetes and high blood pressure.  Dental: Annual dental screenings help detect early signs of oral cancer, gum disease, and other conditions linked to overall health, including heart disease  and diabetes.  Please see the attached documents for additional preventive care recommendations.

## 2024-01-17 NOTE — Progress Notes (Signed)
 "  Chief Complaint  Patient presents with   Medicare Wellness     Subjective:   Daniel Barber is a 73 y.o. male who presents for a Medicare Annual Wellness Visit.  Visit info / Clinical Intake: Medicare Wellness Visit Type:: Subsequent Annual Wellness Visit Persons participating in visit and providing information:: patient Medicare Wellness Visit Mode:: Video Since this visit was completed virtually, some vitals may be partially provided or unavailable. Missing vitals are due to the limitations of the virtual format.: Documented vitals are patient reported If Telephone or Video please confirm:: I connected with patient using audio/video enable telemedicine. I verified patient identity with two identifiers, discussed telehealth limitations, and patient agreed to proceed. Patient Location:: home Provider Location:: office Interpreter Needed?: No Pre-visit prep was completed: yes AWV questionnaire completed by patient prior to visit?: yes Date:: 01/13/24 Living arrangements:: (Patient-Rptd) lives with spouse/significant other Patient's Overall Health Status Rating: (Patient-Rptd) good Typical amount of pain: (Patient-Rptd) some Does pain affect daily life?: (!) (Patient-Rptd) yes Are you currently prescribed opioids?: no  Dietary Habits and Nutritional Risks How many meals a day?: (Patient-Rptd) 2 Eats fruit and vegetables daily?: (Patient-Rptd) yes Most meals are obtained by: (Patient-Rptd) preparing own meals In the last 2 weeks, have you had any of the following?: none Diabetic:: no  Functional Status Activities of Daily Living (to include ambulation/medication): (Patient-Rptd) Independent Ambulation: (Patient-Rptd) Independent Medication Administration: (Patient-Rptd) Independent Home Management (perform basic housework or laundry): (Patient-Rptd) Independent Manage your own finances?: (Patient-Rptd) yes Primary transportation is: (Patient-Rptd) driving Concerns about  vision?: no *vision screening is required for [facility]*  Fall Screening Falls in the past year?: (Patient-Rptd) 0 Number of falls in past year: 0 Was there an injury with Fall?: 0 Fall Risk Category Calculator: 0 Patient Fall Risk Level: Low Fall Risk  Fall Risk Patient at Risk for Falls Due to: No Fall Risks Fall risk Follow up: Falls evaluation completed; Falls prevention discussed  Home and Transportation Safety: All rugs have non-skid backing?: (Patient-Rptd) yes All stairs or steps have railings?: (Patient-Rptd) N/A, no stairs Grab bars in the bathtub or shower?: (Patient-Rptd) yes Have non-skid surface in bathtub or shower?: (Patient-Rptd) yes Good home lighting?: (Patient-Rptd) yes Regular seat belt use?: (Patient-Rptd) yes Hospital stays in the last year:: (Patient-Rptd) no  Cognitive Assessment Difficulty concentrating, remembering, or making decisions? : (Patient-Rptd) no Will 6CIT or Mini Cog be Completed: yes What year is it?: 0 points What month is it?: 0 points Give patient an address phrase to remember (5 components): [address] About what time is it?: 0 points Count backwards from 20 to 1: 0 points Say the months of the year in reverse: 0 points Repeat the address phrase from earlier: 0 points 6 CIT Score: 0 points  Advance Directives (For Healthcare) Does Patient Have a Medical Advance Directive?: Yes Does patient want to make changes to medical advance directive?: No - Patient declined Type of Advance Directive: Healthcare Power of [place]; Living will Copy of Healthcare Power of Attorney in Chart?: No - copy requested Copy of Living Will in Chart?: No - copy requested  Reviewed/Updated  Reviewed/Updated: Reviewed All (Medical, Surgical, Family, Medications, Allergies, Care Teams, Patient Goals)    Allergies (verified) Patient has no known allergies.   Current Medications (verified) Outpatient Encounter Medications as of 01/17/2024   Medication Sig   Aluminum & Magnesium Hydroxide (MAGNESIUM-ALUMINUM HYDROXIDES PO) Take 1 tablet by mouth daily as needed (Heartburn).   Naproxen Sodium (ALEVE) 220 MG  CAPS Take 1 capsule by mouth daily.   omeprazole (PRILOSEC) 20 MG capsule Take 20 mg by mouth daily as needed.   No facility-administered encounter medications on file as of 01/17/2024.    History: Past Medical History:  Diagnosis Date   Arthritis 2015   Cancer [facility]) 1999   prostate   Carpal tunnel syndrome, right    COPD, mild (HCC)    External hemorrhoids    symptomatic   GERD (gastroesophageal reflux disease) 1990   History of adenomatous polyp of colon followed by dr rosalie   tubular adenoma's 1995;  2000;  2006;  2016   History of malignant melanoma of skin 2017   excision posterior neck (localized)   History of prostate cancer followed by pcp   1999--  radical prostatectomy/  per pt last PSA less than 0.1 on May 2017   Nocturia    Wears glasses    Past Surgical History:  Procedure Laterality Date   CARPAL TUNNEL RELEASE Right 11/19/2023   Procedure: CARPAL TUNNEL RELEASE;  Surgeon: Vernetta Lonni GRADE, MD;  Location: [facility];  Service: Orthopedics;  Laterality: Right;  RIGHT OPEN CARPAL TUNNEL RELEASE   COLONOSCOPY  last one 07-27-2014  dr rosalie   ELBOW SURGERY Right 1989   EYE SURGERY  2018   laser   HEMORRHOID SURGERY N/A 08/28/2016   Procedure: HEMORRHOIDECTOMY;  Surgeon: Signe Mitzie LABOR, MD;  Location: [facility];  Service: General;  Laterality: N/A;   KNEE ARTHROSCOPY Left 2002  approx.   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   Occupation: Retired  Tobacco Use   Smoking status: Light Smoker    Current packs/day: 0.00    Types: Cigars, Cigarettes    Last attempt to quit: 08/18/2006    Years since quitting: 17.4   Smokeless tobacco: Never   Tobacco comments:    smokes an occasional cigar 4-5 timees per year  Vaping Use   Vaping status: Never Used  Substance and Sexual Activity   Alcohol use: Yes    Comment: 2 DRINKS DAILY   Drug use: No   Sexual activity: Yes   Tobacco Counseling Ready to quit: Not Answered Counseling given: Not Answered Tobacco comments: smokes an occasional cigar 4-5 timees per year  SDOH Screenings   Food Insecurity: No Food Insecurity (01/17/2024)  Housing: Unknown (01/17/2024)  Transportation Needs: No Transportation Needs (01/17/2024)  Utilities: Not At Risk (01/17/2024)  Alcohol Screen: Low Risk (01/17/2024)  Depression (PHQ2-9): Low Risk (01/17/2024)  Financial Resource Strain: Low Risk (01/17/2024)  Physical Activity: Insufficiently Active (01/17/2024)  Social Connections: Moderately Integrated (01/17/2024)  Stress: No Stress Concern Present (01/17/2024)  Tobacco Use: High Risk (01/17/2024)  Health Literacy: Adequate Health Literacy (01/17/2024)   See flowsheets for full screening details  Depression Screen PHQ 2 & 9 Depression Scale- Over the past 2 weeks, how often have you been bothered by any of the following problems? Little interest or pleasure in doing things: 0 Feeling down, depressed, or  hopeless (PHQ Adolescent also includes...irritable): 0 PHQ-2 Total Score: 0 Trouble falling or staying asleep, or sleeping too much: 1 Feeling tired or having little energy: 0 Poor appetite or overeating (PHQ Adolescent also includes...weight loss): 0 Feeling bad about yourself - or that you are a failure or have let yourself or your family down: 0 Trouble concentrating on things, such as reading the newspaper or watching television (PHQ  Adolescent also includes...like school work): 0 Moving or speaking so slowly that other people could have noticed. Or the opposite - being so fidgety or restless that you have been moving around a lot more than usual: 0 Thoughts that you would be better off dead, or of hurting yourself in some way: 0 PHQ-9 Total Score: 1 If you checked off any problems, how difficult have these problems made it for you to do your work, take care of things at home, or get along with other people?: Not difficult at all     Goals Addressed             This Visit's Progress    Patient Stated       01/17/2024, maintain current health             Objective:    Today's Vitals   01/17/24 0837  Weight: 145 lb (65.8 kg)  Height: 6' (1.829 m)   Body mass index is 19.67 kg/m.  Hearing/Vision screen Hearing Screening - Comments:: Denies hearing issues Vision Screening - Comments:: Regular eye exams, [place]  Immunizations and Health Maintenance Health Maintenance  Topic Date Due   COVID-19 Vaccine (7 - 2025-26 season) 09/20/2023   DTaP/Tdap/Td (3 - Td or Tdap) 05/29/2024   Medicare Annual Wellness (AWV)  01/16/2025   Colonoscopy  04/03/2030   Pneumococcal Vaccine: 50+ Years  Completed   Influenza Vaccine  Completed   Hepatitis C Screening  Completed   Zoster Vaccines- Shingrix  Completed   Meningococcal B Vaccine  Aged Out        Assessment/Plan:  This is a routine wellness examination for [place].  Patient Care Team: Thedora Garnette HERO, MD as PCP - General (Family Medicine) Joshua Sieving, MD as Consulting Physician (Dermatology) Octavia Bruckner, MD as Consulting Physician (Ophthalmology) Vernetta Bruckner GRADE, MD as Consulting Physician (Orthopedic Surgery) Michelina Sharper, DMD (Dentistry)  I have personally reviewed and noted the following in the patients chart:   Medical and social history Use of alcohol, tobacco or illicit drugs  Current medications and supplements including  opioid prescriptions. Functional ability and status Nutritional status Physical activity Advanced directives List of other physicians Hospitalizations, surgeries, and ER visits in previous 12 months Vitals Screenings to include cognitive, depression, and falls Referrals and appointments  No orders of the defined types were placed in this encounter.  In addition, I have reviewed and discussed with patient certain preventive protocols, quality metrics, and best practice recommendations. A written personalized care plan for preventive services as well as general preventive health recommendations were provided to patient.   Ardella FORBES Dawn, LPN   [id number]   Return in 1 year (on 01/16/2025).  After Visit Summary: (MyChart) Due to this being a telephonic visit, the after visit summary with patients personalized plan was offered to patient via MyChart   Nurse Notes: HM Addressed: Vaccines Due: covid  "

## 2024-11-21 ENCOUNTER — Encounter: Admitting: Family Medicine

## 2025-01-22 ENCOUNTER — Ambulatory Visit
